# Patient Record
Sex: Male | Born: 1950
Health system: Southern US, Community
[De-identification: ages and names within clinical notes are randomized; demographics above are authoritative.]

## PROBLEM LIST (undated history)

## (undated) DIAGNOSIS — K089 Disorder of teeth and supporting structures, unspecified: Principal | ICD-10-CM

## (undated) DIAGNOSIS — H269 Unspecified cataract: Secondary | ICD-10-CM

## (undated) DIAGNOSIS — C8590 Non-Hodgkin lymphoma, unspecified, unspecified site: Secondary | ICD-10-CM

## (undated) DIAGNOSIS — C801 Malignant (primary) neoplasm, unspecified: Secondary | ICD-10-CM

## (undated) DIAGNOSIS — F32A Depression, unspecified: Secondary | ICD-10-CM

## (undated) DIAGNOSIS — M1990 Unspecified osteoarthritis, unspecified site: Secondary | ICD-10-CM

## (undated) DIAGNOSIS — R5383 Other fatigue: Secondary | ICD-10-CM

## (undated) DIAGNOSIS — E039 Hypothyroidism, unspecified: Principal | ICD-10-CM

## (undated) DIAGNOSIS — H9190 Unspecified hearing loss, unspecified ear: Secondary | ICD-10-CM

## (undated) DIAGNOSIS — F329 Major depressive disorder, single episode, unspecified: Secondary | ICD-10-CM

## (undated) DIAGNOSIS — Z923 Personal history of irradiation: Secondary | ICD-10-CM

## (undated) HISTORY — DX: Personal history of irradiation: Z92.3

## (undated) HISTORY — DX: Unspecified osteoarthritis, unspecified site: M19.90

## (undated) HISTORY — DX: Malignant (primary) neoplasm, unspecified: C80.1

## (undated) HISTORY — DX: Major depressive disorder, single episode, unspecified: F32.9

## (undated) HISTORY — DX: Unspecified cataract: H26.9

## (undated) HISTORY — DX: Depression, unspecified: F32.A

## (undated) HISTORY — DX: Hypothyroidism, unspecified: E03.9

## (undated) HISTORY — PX: TONSILLECTOMY: SUR1361

## (undated) HISTORY — DX: Disorder of teeth and supporting structures, unspecified: K08.9

## (undated) HISTORY — DX: Other fatigue: R53.83

---

## 2013-09-10 ENCOUNTER — Ambulatory Visit (INDEPENDENT_AMBULATORY_CARE_PROVIDER_SITE_OTHER): Payer: BC Managed Care – PPO | Admitting: Emergency Medicine

## 2013-09-10 ENCOUNTER — Encounter (HOSPITAL_COMMUNITY): Payer: Self-pay

## 2013-09-10 ENCOUNTER — Inpatient Hospital Stay (HOSPITAL_COMMUNITY)
Admission: AD | Admit: 2013-09-10 | Discharge: 2013-09-11 | DRG: 607 | Disposition: A | Discharge status: Home / Self Care | Payer: BC Managed Care – PPO | Source: Ambulatory Visit | Attending: Family Medicine | Admitting: Family Medicine

## 2013-09-10 ENCOUNTER — Inpatient Hospital Stay (HOSPITAL_COMMUNITY): Payer: BC Managed Care – PPO

## 2013-09-10 ENCOUNTER — Ambulatory Visit (INDEPENDENT_AMBULATORY_CARE_PROVIDER_SITE_OTHER): Payer: BC Managed Care – PPO

## 2013-09-10 VITALS — BP 158/92 | HR 86 | Temp 98.3°F | Resp 18 | Ht 64.25 in | Wt 200.4 lb

## 2013-09-10 DIAGNOSIS — E876 Hypokalemia: Secondary | ICD-10-CM | POA: Diagnosis present

## 2013-09-10 DIAGNOSIS — R221 Localized swelling, mass and lump, neck: Secondary | ICD-10-CM

## 2013-09-10 DIAGNOSIS — R1310 Dysphagia, unspecified: Secondary | ICD-10-CM | POA: Diagnosis present

## 2013-09-10 DIAGNOSIS — R220 Localized swelling, mass and lump, head: Principal | ICD-10-CM | POA: Diagnosis present

## 2013-09-10 DIAGNOSIS — I10 Essential (primary) hypertension: Secondary | ICD-10-CM | POA: Diagnosis present

## 2013-09-10 DIAGNOSIS — J358 Other chronic diseases of tonsils and adenoids: Secondary | ICD-10-CM | POA: Diagnosis present

## 2013-09-10 DIAGNOSIS — C770 Secondary and unspecified malignant neoplasm of lymph nodes of head, face and neck: Secondary | ICD-10-CM | POA: Diagnosis present

## 2013-09-10 LAB — COMPREHENSIVE METABOLIC PANEL
ALK PHOS: 102 U/L (ref 39–117)
ALT: 43 U/L (ref 0–53)
AST: 32 U/L (ref 0–37)
Albumin: 3.4 g/dL — ABNORMAL LOW (ref 3.5–5.2)
Anion gap: 16 — ABNORMAL HIGH (ref 5–15)
BUN: 18 mg/dL (ref 6–23)
CALCIUM: 8.9 mg/dL (ref 8.4–10.5)
CO2: 23 meq/L (ref 19–32)
Chloride: 98 mEq/L (ref 96–112)
Creatinine, Ser: 1.01 mg/dL (ref 0.50–1.35)
GFR, EST AFRICAN AMERICAN: 89 mL/min — AB (ref >90)
GFR, EST NON AFRICAN AMERICAN: 77 mL/min — AB (ref >90)
GLUCOSE: 113 mg/dL — AB (ref 70–99)
Potassium: 3.5 mEq/L — ABNORMAL LOW (ref 3.7–5.3)
Sodium: 137 mEq/L (ref 137–147)
Total Bilirubin: 0.6 mg/dL (ref 0.3–1.2)
Total Protein: 8.4 g/dL — ABNORMAL HIGH (ref 6.0–8.3)

## 2013-09-10 LAB — CBC WITH DIFFERENTIAL/PLATELET
BASOS PCT: 1 % (ref 0–1)
Basophils Absolute: 0.1 10*3/uL (ref 0.0–0.1)
Eosinophils Absolute: 0.2 10*3/uL (ref 0.0–0.7)
Eosinophils Relative: 1 % (ref 0–5)
HCT: 39.5 % (ref 39.0–52.0)
Hemoglobin: 14 g/dL (ref 13.0–17.0)
Lymphocytes Relative: 22 % (ref 12–46)
Lymphs Abs: 2.4 10*3/uL (ref 0.7–4.0)
MCH: 30.1 pg (ref 26.0–34.0)
MCHC: 35.4 g/dL (ref 30.0–36.0)
MCV: 84.9 fL (ref 78.0–100.0)
Monocytes Absolute: 1 10*3/uL (ref 0.1–1.0)
Monocytes Relative: 9 % (ref 3–12)
NEUTROS PCT: 67 % (ref 43–77)
Neutro Abs: 7.2 10*3/uL (ref 1.7–7.7)
Platelets: 260 10*3/uL (ref 150–400)
RBC: 4.65 MIL/uL (ref 4.22–5.81)
RDW: 13.3 % (ref 11.5–15.5)
WBC: 10.8 10*3/uL — ABNORMAL HIGH (ref 4.0–10.5)

## 2013-09-10 LAB — POCT CBC
GRANULOCYTE PERCENT: 66.6 % (ref 37–80)
HCT, POC: 46.7 % (ref 43.5–53.7)
HEMOGLOBIN: 15.5 g/dL (ref 14.1–18.1)
Lymph, poc: 3 (ref 0.6–3.4)
MCH: 29.5 pg (ref 27–31.2)
MCHC: 33.1 g/dL (ref 31.8–35.4)
MCV: 89.2 fL (ref 80–97)
MID (cbc): 1 — AB (ref 0–0.9)
MPV: 8.1 fL (ref 0–99.8)
PLATELET COUNT, POC: 266 10*3/uL (ref 142–424)
POC Granulocyte: 8 — AB (ref 2–6.9)
POC LYMPH PERCENT: 25.2 %L (ref 10–50)
POC MID %: 8.2 % (ref 0–12)
RBC: 5.24 M/uL (ref 4.69–6.13)
RDW, POC: 13.5 %
WBC: 12 10*3/uL — AB (ref 4.6–10.2)

## 2013-09-10 MED ORDER — ONDANSETRON HCL 4 MG/2ML IJ SOLN: 4.0000 mg | Freq: Four times a day (QID) | INTRAMUSCULAR | Status: DC | PRN

## 2013-09-10 MED ORDER — SODIUM CHLORIDE 0.9 % IJ SOLN
3.0000 mL | Freq: Two times a day (BID) | INTRAMUSCULAR | Status: DC
  Administered 2013-09-10 – 2013-09-11 (×2): 3 mL via INTRAVENOUS

## 2013-09-10 MED ORDER — HYDROCODONE-ACETAMINOPHEN 5-325 MG PO TABS
1.0000 | ORAL_TABLET | ORAL | Status: DC | PRN
  Administered 2013-09-10 – 2013-09-11 (×4): 2 via ORAL
  Filled 2013-09-10 (×4): qty 2

## 2013-09-10 MED ORDER — ONDANSETRON HCL 4 MG PO TABS
4.0000 mg | ORAL_TABLET | Freq: Four times a day (QID) | ORAL | Status: DC | PRN
  Administered 2013-09-11: 4 mg via ORAL
  Filled 2013-09-10: qty 1

## 2013-09-10 MED ORDER — IOHEXOL 300 MG/ML  SOLN
75.0000 mL | Freq: Once | INTRAMUSCULAR | Status: AC | PRN
  Administered 2013-09-10: 75 mL via INTRAVENOUS

## 2013-09-10 MED ORDER — SODIUM CHLORIDE 0.9 % IJ SOLN: 3.0000 mL | INTRAMUSCULAR | Status: DC | PRN

## 2013-09-10 MED ORDER — HEPARIN SODIUM (PORCINE) 5000 UNIT/ML IJ SOLN
5000.0000 [IU] | Freq: Three times a day (TID) | INTRAMUSCULAR | Status: DC
Start: 2013-09-10 — End: 2013-09-11
  Administered 2013-09-10 – 2013-09-11 (×2): 5000 [IU] via SUBCUTANEOUS
  Filled 2013-09-10 (×5): qty 1

## 2013-09-10 MED ORDER — SODIUM CHLORIDE 0.9 % IV SOLN: 250.0000 mL | INTRAVENOUS | Status: DC | PRN

## 2013-09-10 NOTE — Progress Notes (Addendum)
Subjective:    Patient ID: Norman [name], male    DOB: 1950/09/01, 63 y.o.   MRN: [id number]  This chart was scribed for Arlyss Queen, MD by Erling Conte, Medical Scribe. This patient was seen in Room 4 and the patient's care was started at 2:04 PM.  Chief Complaint  Patient presents with  . Neck Pain    X 1 month     HPI HPI Comments: Aidon [name] is a 63 y.o. male who presents [facility] the [facility] complaining of constant, moderate, neck pain for 1 month. He went to the hospital for the same problem about 2-3 weeks ago and was hospitalized for an infection. He is having associated right sided, gradually worsening, swelling of his neck.. He has had no desire to eat or drink anything. The pt states that the swelling is starting to cause pain in his right ear and affect the vision in his right eye. Pt states that the pain is exacerbated by turning his neck and bending down to pick objects up. He denies seeing any specialist since being discharged. Pt was told when he was discharged that he should be better with the medication but he may have to see a specialist, however, they did not recommend anyone. He has taken all the antibiotics he was prescribed. He denies any fever, nausea, SOB, cough, trouble swallowing, chest pain, emesis. 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   Review of Systems  Constitutional: Positive for appetite change. Negative for fever and chills.  HENT: Positive for ear pain (right). Negative for trouble swallowing.   Eyes: Positive for visual disturbance (right eye).  Respiratory: Negative for cough and shortness of breath.   Cardiovascular: Negative for chest pain.  Gastrointestinal: Negative for nausea, vomiting and diarrhea.  Musculoskeletal: Positive for neck pain (and right sided swelling).       Objective:   Physical Exam CONSTITUTIONAL: Well developed/well nourished HEAD: Normocephalic/atraumatic EYES: EOMI/PERRL ENMT: Mucous  membranes moist NECK: supple no meningeal signs. Grapefruit sized mass on right side of neck.  SPINE:entire spine nontender CV: S1/S2 noted, no murmurs/rubs/gallops noted LUNGS: Lungs are clear to auscultation bilaterally, no apparent distress ABDOMEN: soft, nontender, no rebound or guarding GU:no cva tenderness NEURO: Pt is awake/alert, moves all extremitiesx4 EXTREMITIES: pulses normal, full ROM SKIN: warm, color normal PSYCH: no abnormalities of mood noted Results for orders placed in visit on 09/10/13  POCT CBC      Result Value Ref Range   WBC 12.0 (*) 4.6 - 10.2 K/uL   Lymph, poc 3.0  0.6 - 3.4   POC LYMPH PERCENT 25.2  10 - 50 %L   MID (cbc) 1.0 (*) 0 - 0.9   POC MID % 8.2  0 - 12 %M   POC Granulocyte 8.0 (*) 2 - 6.9   Granulocyte percent 66.6  37 - 80 %G   RBC 5.24  4.69 - 6.13 M/uL   Hemoglobin 15.5  14.1 - 18.1 g/dL   HCT, POC 46.7  43.5 - 53.7 %   MCV 89.2  80 - 97 fL   MCH, POC 29.5  27 - 31.2 pg   MCHC 33.1  31.8 - 35.4 g/dL   RDW, POC 13.5     Platelet Count, POC 266  142 - 424 K/uL   MPV 8.1  0 - 99.8 fL  [facility] reading (PRIMARY) by  Dr.Oma Alpert no hilar adenopathy seen.     Assessment & Plan:  Patient presents with a  one-month history of gradually enlarging mass in the right side of his neck. Apparently he was hospitalized in [place] for approximately 4 days. Patient presents today with an enlarging mass right side of his neck associated with difficulty swallowing and eating. He does have tonsillar enlargement on that side with a rockhard grapefruit size mass. It is unclear whether this is a neoplastic process or infectious process. Patient needs emergent CT with definitive treatment dependent on these  findings . His exam is very suspicious for a neoplastic process of note there also is tonsillar enlargement on the right side. Scrofula is also in the differential if this is not a tumor.

## 2013-09-10 NOTE — Progress Notes (Signed)
Patient arrived to floor, paged admitting doctor for orders.

## 2013-09-10 NOTE — Addendum Note (Signed)
Addended by: Arlyss Queen A on: 09/10/2013 04:26 PM   Modules accepted: Level of Service

## 2013-09-10 NOTE — Patient Instructions (Signed)
He needs to go to the [facility] Emergency room to register and be admitted.

## 2013-09-10 NOTE — H&P (Signed)
[facility] Admission History and Physical Service Pager: [phone]  Patient name: Norman [name] Medical record number: [id number] Date of birth: February 16, 1950 Age: 63 y.o. Gender: male  Primary Care Provider: No primary provider on file. Consultants: ENT Code Status: Full (confirmed on admission)  Chief Complaint: Right neck mass.   Assessment and Plan: Witten Certain is a 63 y.o. male presenting with worsening firm mass in right side of neck and lower jaw, causing difficulties with swallowing, without significant airway obstruction. Concerning for malignancy. No significant past medical history (no regular medical follow-up, denies tobacco or alcohol use).  # Right Tonsilar mass, extending into R-neck, suggestive of metastatic tonsillar cancer - Presented with worsening Right neck / facial mass >1 month, presenting symptoms with local pain and decreased swallowing, denies any respiratory compromise. No significant associated symptoms, afebrile, no constitutional symptoms or signs of infection. Recently treated at hospital in [facility], reportedly concern for infection, received antibiotics, imaging, but no conclusive biopsy or diagnosis. Patient currently living in [place], without PCP, direct admitted from Urgent Care for emergent CT imaging, observation to ensure no respiratory compromise, and work-up towards definitive diagnosis. - Admitted to FPTS, inpatient status, med-surg floor - Currently VS with elevated BP, otherwise stable. Exam consistent with large firm mass extending from anterolateral neck into Right jaw/facial region, evidence of tonsillar involvement in oropharynx without obstructed airway. No erythema or evidence of infection. 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 Not currently on any medications - Elevated BP to SBP 160-170 - Consider starting anti-HTN if persistent, vs on discharge  # Hypokalemia - K 3.5 on admission - Re-check in AM, replete PRN  FEN/GI: SLIV Prophylaxis: SQ Heparin  Disposition: Admitted to inpatient status for severe worsening Right neck mass, concern for malignancy, further work-up with CT, proceed with consult ENT / Oncology for further diagnostic work-up while inpatient and arrangement of outpatient follow-up.   History of Present Illness: Norman [name] is a 63 y.o. male presenting with mass in right side of neck.  Started 2 months ago in lower part of right neck pain, with small lump. Initially went to hospital in [place] several weeks ago with sore throat, as told that it might be an infection at this time. Stated imaging and tests were done, and decision to treat with antibiotics and pain medicine. Recommended at that time to see specialist, but has not followed up. Currently with worsening enlargement of mass and increased pain. Completed antibiotics without relief, pain medicine not working. Increasing difficulty with swallowing for past 2-3 days. No difficulties breathing. States that pain is worse with movement, laying flat, palpation, and swallowing, otherwise more of a discomfort vs pain.  Admits to decreased appetite, mild nausea, some intermittent blurred vision. Denies any significant fevers/chills, vomiting, weight loss, night sweats.  Review Of Systems: Per HPI with the following additions:  Otherwise 12 point review of systems was performed and was unremarkable.  Patient Active Problem List   Diagnosis Date  Noted  . Mass of right side of neck 09/10/2013  Past Medical History: Past Medical History  Diagnosis Date  . Arthritis   . Cataract   . Depression    Past Surgical History: History reviewed. No pertinent past surgical history. Social History: History  Substance Use Topics  . Smoking status: Never Smoker   . Smokeless tobacco: Never Used  . Alcohol Use: No   Additional social history: - Denies ever using tobacco products (no smoking or smokeless), no drugs or alcohol - Rare second hand smoke exposure.  Please also refer to relevant sections of EMR.  Family History: History reviewed. No pertinent family history.  Mother with cancer (unknown type). Sister with diabetes  Allergies and Medications: Allergies  Allergen Reactions  . Penicillins Other (See Comments)    Unknown   No current facility-administered medications on file prior to encounter.   No current outpatient prescriptions on file prior to encounter.    Objective: BP 179/97  Pulse 84  Temp(Src) 98.9 F (37.2 C) (Oral)  Resp 18  Ht 5\' 4"  (1.626 m)  Wt 199 lb 9.6 oz (90.538 kg)  BMI 34.24 kg/m2  SpO2 97% Exam: General: well-appearing 63 yr Hispanic male, cooperative, conversational, NAD Head: Large firm bulky mass extending from Right anterolateral neck edge of trachea to Right lateral angle of mandible and into cheek, without erythema, +mild tenderness to touch over neck EENT: PERRL, EOMI, patent nares, oropharynx with enlarged Right palatine tonsil bulging into oropharynx without obstruction, no exudate or erythema, uvula midline, mild dry MM Neck: non-tender and supple (other than above mass), no supraclavicular nodes palpated Cardiovascular: RRR, no murmurs Respiratory: CTAB, no wheezing, rhonchi, crackles. Good air movement, normal work of breathing. Abdomen: obese, soft, NTND, no masses, +active BS Extremities: non-tender, +1 L>R pitting pedal edema, intact peripheral pulses +2 b/l Skin: warm,  dry, no rashes Neuro: awake, alert, oriented, grossly non-focal, CN-II-XII intact, bilateral grip and ankle strength 5/5 b/l ext, intact distal sensation to light touch  Labs and Imaging: CBC BMET   Recent Labs Lab 09/10/13 1806  WBC 10.8*  HGB 14.0  HCT 39.5  PLT 260    Recent Labs Lab 09/10/13 1806  NA 137  K 3.5*  CL 98  CO2 23  BUN 18  CREATININE 1.01  GLUCOSE 113*  CALCIUM 8.9     9/5 CXR 2v IMPRESSION:  No active cardiopulmonary disease or evidence of lung mass or gross  hilar lymphadenopathy.  9/5 CT Neck / Maxillofacial w/ contrast 1. Masslike enlargement of the right palatine tonsil with extensive,  bulky, necrotic lymphadenopathy throughout the right neck, including  a 5.8 cm necrotic right-sided nodal mass with extracapsular  extension. 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  2. Severely compressed versus occluded internal jugular vein in the  mid and upper right neck.  Nobie Putnam, DO 09/10/2013, 10:00 PM [place], PGY-2 [place] Intern pager: [phone], text pages welcome

## 2013-09-11 ENCOUNTER — Encounter: Payer: Self-pay | Admitting: Family Medicine

## 2013-09-11 DIAGNOSIS — R220 Localized swelling, mass and lump, head: Principal | ICD-10-CM

## 2013-09-11 DIAGNOSIS — C770 Secondary and unspecified malignant neoplasm of lymph nodes of head, face and neck: Secondary | ICD-10-CM | POA: Diagnosis present

## 2013-09-11 DIAGNOSIS — R221 Localized swelling, mass and lump, neck: Principal | ICD-10-CM

## 2013-09-11 DIAGNOSIS — I10 Essential (primary) hypertension: Secondary | ICD-10-CM | POA: Diagnosis present

## 2013-09-11 LAB — BASIC METABOLIC PANEL
ANION GAP: 10 (ref 5–15)
BUN: 17 mg/dL (ref 6–23)
CALCIUM: 8.4 mg/dL (ref 8.4–10.5)
CHLORIDE: 101 meq/L (ref 96–112)
CO2: 27 meq/L (ref 19–32)
Creatinine, Ser: 1.07 mg/dL (ref 0.50–1.35)
GFR calc Af Amer: 83 mL/min — ABNORMAL LOW (ref >90)
GFR calc non Af Amer: 72 mL/min — ABNORMAL LOW (ref >90)
Glucose, Bld: 103 mg/dL — ABNORMAL HIGH (ref 70–99)
Potassium: 3.6 mEq/L — ABNORMAL LOW (ref 3.7–5.3)
SODIUM: 138 meq/L (ref 137–147)

## 2013-09-11 LAB — CBC
HCT: 40.8 % (ref 39.0–52.0)
Hemoglobin: 14.1 g/dL (ref 13.0–17.0)
MCH: 30.1 pg (ref 26.0–34.0)
MCHC: 34.6 g/dL (ref 30.0–36.0)
MCV: 87.2 fL (ref 78.0–100.0)
PLATELETS: 252 10*3/uL (ref 150–400)
RBC: 4.68 MIL/uL (ref 4.22–5.81)
RDW: 13.4 % (ref 11.5–15.5)
WBC: 9.9 10*3/uL (ref 4.0–10.5)

## 2013-09-11 MED ORDER — HYDROCODONE-ACETAMINOPHEN 7.5-325 MG/15ML PO SOLN: 10.0000 mL | Freq: Four times a day (QID) | ORAL | Status: DC | PRN

## 2013-09-11 MED ORDER — ONDANSETRON 4 MG PO TBDP: 4.0000 mg | ORAL_TABLET | Freq: Three times a day (TID) | ORAL | Status: DC | PRN

## 2013-09-11 NOTE — Discharge Summary (Signed)
[facility] Discharge Summary  Patient name: Norman [name] Medical record number: [id number] Date of birth: May 24, 1950 Age: 63 y.o. Gender: male Date of Admission: 09/10/2013  Date of Discharge: 09/11/13 Admitting Physician: Lupita Dawn, MD  Primary Care Provider: No primary provider on file. 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 Right neck lymph nodes with extranodal extension Elevated BP, presumed HTN  Disposition: Home  Discharge Condition: Stable  Discharge Exam: General: well-appearing 53 yr Hispanic male, cooperative, conversational, NAD  Head: Large firm bulky mass extending from Right anterolateral neck edge of trachea to Right lateral angle of mandible and into cheek, without erythema, +mild tenderness to touch over neck  EENT: PERRL, EOMI, patent nares, oropharynx with enlarged Right palatine tonsil bulging into oropharynx without obstruction, no exudate or erythema, uvula midline, mild dry MM  Neck: non-tender and supple (other than above mass), no supraclavicular nodes palpated  Cardiovascular: RRR, no murmurs  Respiratory: CTAB, no wheezing, rhonchi, crackles. Good air movement, normal work of breathing.  Abdomen: obese, soft, NTND, no masses, +active BS  Extremities: non-tender, +1 L>R pitting pedal edema, intact peripheral pulses +2 b/l  Skin: warm, dry, no rashes  Neuro: awake, alert, oriented, grossly non-focal, CN-II-XII intact, bilateral grip and ankle strength 5/5 b/l ext, intact distal sensation to light touch  Brief Hospital Course:   Norman [name] is a 63 y.o. male who was directly admitted from Urgent Care with worsening firm mass in right side of neck and lower jaw over past 1 month, recently causing difficulties with swallowing, but no significant airway  obstruction. Presentation concerning for malignancy, without significant history of smoking, tobacco use, or alcohol. No significant PMH known (pt previously without regular follow-up). Of note, recently hospitalized about 3 weeks ago in [facility], reported to have evaluated mass at that time and concerned for infection, however patient did not improve on antibiotics, there was no evidence of infection on history or exam. On admission, patient with significantly large and firm Right neck mass, also with significant Right tonsillar involvement without airway obstruction, or significant complications on exam, other than difficulty with solid foods but tolerating liquids and soft foods well. 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 Patient remained stable through hospitalization. Noted to have elevated BP without prior diagnosis HTN. Pain improved with Norco, tolerated soft diet and liquids.  Contacted ENT for recommendations, spoke with Dr. Erik Obey at [facility] ENT, advised that the most appropriate next step towards tissue diagnosis and treatment would be for patient to present to [facility] ENT office for Tonsil biopsy and FNA of mass/nodes. Advised to have patient call office on Tuesday 09/13/13 to be scheduled as soon as possible for biopsy and further evaluation, as long as patient comfortable, swallowing without difficulty, and without respiratory compromise. After tissue biopsy, ENT will coordinate follow-up with Oncology for proceeding with therapy.  Discussed in detail all of our findings, recommendations, and plan from ENT with patient and his daughter, via [place] interpreter. They understood the diagnosis, and agreed upon the proposed plan. Prescribed  Hydrocodone solution and Zofran ODT for symptom relief, advised laying upright in bed, continue soft diet. Strict return precautions given if worsening  swelling, pain, respiratory difficulty, shortness of breath, no longer able to swallow, fevers, headaches. Advised to go directly to ED for further evaluation if worsening. Patient plans to follow-up with [facility] for outpatient medicine.  Issues for Follow Up:  1. 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 Oncology follow-up - ENT to arrange pending biopsy 3. HTN - Suspect new diagnosis HTN given elevated BP, recommend future follow-up and BP re-check once treatment underway  Significant Procedures: none  Significant Labs and Imaging:   Recent Labs Lab 09/10/13 1806 09/11/13 0347  WBC 10.8* 9.9  HGB 14.0 14.1  HCT 39.5 40.8  PLT 260 252    Recent Labs Lab 09/10/13 1806 09/11/13 0347  NA 137 138  K 3.5* 3.6*  CL 98 101  CO2 23 27  GLUCOSE 113* 103*  BUN 18 17  CREATININE 1.01 1.07  CALCIUM 8.9 8.4  ALKPHOS 102  --   AST 32  --   ALT 43  --   ALBUMIN 3.4*  --    9/5 CXR 2v  IMPRESSION:  No active cardiopulmonary disease or evidence of lung mass or gross  hilar lymphadenopathy.  9/5 CT Neck / Maxillofacial w/ contrast  1. Masslike enlargement of the right palatine tonsil with extensive,  bulky, necrotic lymphadenopathy throughout the right neck, including  a 5.8 cm necrotic right-sided nodal mass with extracapsular  extension. 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  2. Severely compressed versus occluded internal jugular vein in the  mid and upper right neck.  Results/Tests Pending at Time of Discharge: none  Discharge Medications:    Medication List         HYDROcodone-acetaminophen 7.5-325 mg/15 ml solution  Commonly known as:  HYCET  Take 10 mLs by mouth every 6 (six) hours as needed for  moderate pain.     ondansetron 4 MG disintegrating tablet  Commonly known as:  ZOFRAN ODT  Take 1 tablet (4 mg total) by mouth every 8 (eight) hours as needed for nausea or vomiting.        Discharge Instructions: Please refer to Patient Instructions section of EMR for full details.  Patient was counseled important signs and symptoms that should prompt return to medical care, changes in medications, dietary instructions, activity restrictions, and follow up appointments.   Follow-Up Appointments: Follow-up Information   Follow up with [name], MD. Call on 09/13/2013. (Please call [phone]) to schedule appointment with Dr. Erik Obey or any ENT for evaluation of "Right Tonsilar Mass with Lymph Node extension".)    Specialty:  Otolaryngology   Contact information:   [address] [place] [address] [phone]       Follow up with DAUB, STEVE A, MD. Schedule an appointment as soon as possible for a visit in 1 week. (walk in or schedule hospital follow-up)    Specialty:  Family Medicine   Contact information:   [place] [address] [phone]       Nobie Putnam, DO 09/11/2013, 12:18 PM PGY-2, [place]

## 2013-09-11 NOTE — Progress Notes (Signed)
Discharge instructions gone over with patient and daughter. Home medications gone over. Prescriptions given. Note for work also given. Patient is to follow up with Dr. Erik Obey (ENT) and family practice. Patient is to schedule those appointments Tuesday morning. Instructions regarding reasons to call the doctor and activity gone over with the patient and family. Patient is to stay propped up also. Instructions are also written in Spanish and given to patient. My chart discussed. Patient and family verbalized understanding of instructions.

## 2013-09-11 NOTE — Discharge Summary (Signed)
I agree with the discharge summary as documented.   Matthan Sledge MD  

## 2013-09-11 NOTE — H&P (Signed)
FMTS Attending Note  I personally saw and evaluated the patient. The plan of care was discussed with the resident team. I agree with the assessment and plan as documented by the resident.   63 year old male presents with right neck mass, increasing in size for the past 2-3 months, directly admitted from [facility] urgent care, he reports some difficulty with swallowing, no difficulty with respiration, no cough, no chest pain, no associated fevers or chills, please refer to resident note for additional history of present illness  Vitals: Reviewed General: Pleasant Hispanic male, no acute distress HEENT: Normocephalic, pupils equal in size bilaterally, extra ocular movements are intact, nasal septum midline, no rhinorrhea, moist mucous membranes, 4+ tonsil on the right, per mass present over the right anterior neck Cardiac: Regular rate and rhythm, S1 and S2 present, no murmurs, no heaves or thrills Respiratory: Clear to auscultation bilaterally, normal Abdomen: Soft, nontender, bowel sounds present  CT head and neck identified tonsillar mass suspicious for tonsillar malignancy on the right  Assessment and plan: 63 year-old male admitted with right neck mass 1. Right neck mass-CT scan suspicious for tonsillar malignancy, ENT consulted however they recommended outpatient followup if no acute respiratory or GI compromise 2. Agree with the assessment and plan for other medical issues  Dossie Arbour MD

## 2013-09-11 NOTE — Discharge Instructions (Signed)
Usted fue hospitalizado para una evaluacin posterior del cuello Misa Derecho. 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 Como ya comentamos, este es el cncer ms probable, pero no sabemos qu tipo en este punto. El siguiente paso es para el especialista ENT para realizar una "biopsia de tejido" en la oficina de probar y ver qu tipo es. Ellos van a discutir este procedimiento con usted en detalle. A continuacin, se program para el seguimiento con un mdico especialista en cncer (onclogo), puede tener varios onclogos y usted puede ser tratado con quimioterapia (medicamentos), radiacin (rayos X), y / o remocin [place] tambin.  Por favor, llame al mdico de [facility] ENT - Dr. Erik Obey) primero que la maana del [name] (08.09.15) - Nmero de telfono [phone]) - decirles que usted acaba hospitalizado con Norman [name] "tonsilar gran masa derecho con extensin a los ganglios linfticos ", y el equipo del hospital discutieron el caso con el Dr. Erik Obey y l quera que usted sea visto tan pronto como sea posible.   Prescrita hidrocodona (medicamento para Conservation officer, historic buildings) lquido, tomar - 10 ml cada 6 horas segn sea necesario para Conservation officer, historic buildings (o 4 veces al da) - Consulte a su farmacutico para obtener instruye detalladas si es necesario. Zofran prescrita (medicina nuseas) disolver la tableta - lugar en la boca debajo de la lengua y se disolver, no tragar. El medicamento para el dolor puede hacer que las nuseas, por lo que es una buena idea tomar [place].  Recomendamos que usted duerme elevada, con la cabeza hacia arriba para evitar la compresin de la va area. Recomendamos que usted come [employer] (yogur, granos, Actor, compota de [place]), para evitar la asfixia o difciles alimentos, que pueden llegar a atascarse.  Si usted  desarrolla hinchazn empeoramiento significativo, dolor, dificultad para respirar, falta de aliento, otra hinchazn, dificultad o no puede tragar, vaya directamente a la sala de   -----------------------  You were hospitalized for further evaluation of the Right Neck Mass. CT scan showed that it is most likely a cancerous growth from your Right Tonsil, which has spread to Lymph Nodes in your neck causing the bulging and swelling. As we discussed, this is most likely cancer, but we do not know what type at this point. Next step is for the ENT specialist to perform a "tissue biopsy" in the office to sample it and see what type it is. They will discuss this procedure with you in detail. You will then be scheduled to follow-up with a Cancer Doctor Marine scientist), you may have multiple cancer doctors and you may be treated with Chemotherapy (medicine), Radiation (X-rays), and/or Surgical removal as well.  Please call the ENT Doctor [facility] ENT - Dr. Erik Obey) first thing Tuesday morning (09/13/13) - phone number [phone]) - tell them that you were just hospitalized with a "Large Right Tonsilar Mass with extension to lymph nodes", and the hospital team discussed the case with Dr. Erik Obey and he wanted you to be seen as soon as possible.  Prescribed Hydrocodone (pain medicine) liquid, take - 10 mL every 6 hours as needed for pain (or 4 times a day) - Ask your pharmacist for detailed instructs if needed. Prescribed Zofran (nausea medicine) dissolving tablet - place in mouth under tongue and it will dissolve, do not swallow. The pain medicine may make you nauseas, so it is a good idea to  take both.  Recommend that you sleep elevated, with your head up to avoid airway compression. Recommend that you eat soft diet (yogurt, grits, cereal, apple sauce, eggs), to avoid choking or tough foods, which may get stuck.  If you develop significant worsening swelling, pain, difficulty breathing, shortness of breath,  other swelling, difficulty or unable to swallow, please go directly to the Emergency Department.

## 2013-09-13 ENCOUNTER — Other Ambulatory Visit: Payer: Self-pay | Admitting: Otolaryngology

## 2013-09-13 ENCOUNTER — Ambulatory Visit (INDEPENDENT_AMBULATORY_CARE_PROVIDER_SITE_OTHER): Payer: BC Managed Care – PPO | Admitting: Family Medicine

## 2013-09-13 ENCOUNTER — Encounter: Payer: Self-pay | Admitting: Family Medicine

## 2013-09-13 ENCOUNTER — Other Ambulatory Visit (HOSPITAL_COMMUNITY)
Admission: RE | Admit: 2013-09-13 | Discharge: 2013-09-13 | Disposition: A | Discharge status: Home / Self Care | Payer: Medicaid Other | Source: Ambulatory Visit | Attending: Otolaryngology | Admitting: Otolaryngology

## 2013-09-13 VITALS — BP 194/96 | HR 90 | Temp 98.3°F | Resp 20 | Ht 63.0 in | Wt 196.4 lb

## 2013-09-13 DIAGNOSIS — R220 Localized swelling, mass and lump, head: Secondary | ICD-10-CM | POA: Insufficient documentation

## 2013-09-13 DIAGNOSIS — I517 Cardiomegaly: Secondary | ICD-10-CM

## 2013-09-13 DIAGNOSIS — I10 Essential (primary) hypertension: Secondary | ICD-10-CM

## 2013-09-13 DIAGNOSIS — C760 Malignant neoplasm of head, face and neck: Secondary | ICD-10-CM

## 2013-09-13 DIAGNOSIS — Z789 Other specified health status: Secondary | ICD-10-CM

## 2013-09-13 DIAGNOSIS — R221 Localized swelling, mass and lump, neck: Principal | ICD-10-CM

## 2013-09-13 DIAGNOSIS — R609 Edema, unspecified: Secondary | ICD-10-CM

## 2013-09-13 DIAGNOSIS — J029 Acute pharyngitis, unspecified: Secondary | ICD-10-CM

## 2013-09-13 LAB — POCT CBC
Granulocyte percent: 66.7 %G (ref 37–80)
HCT, POC: 44.5 % (ref 43.5–53.7)
HEMOGLOBIN: 14.9 g/dL (ref 14.1–18.1)
LYMPH, POC: 2.3 (ref 0.6–3.4)
MCH, POC: 29.5 pg (ref 27–31.2)
MCHC: 33.5 g/dL (ref 31.8–35.4)
MCV: 87.8 fL (ref 80–97)
MID (cbc): 0.9 (ref 0–0.9)
MPV: 7.2 fL (ref 0–99.8)
POC Granulocyte: 6.3 (ref 2–6.9)
POC LYMPH PERCENT: 23.8 %L (ref 10–50)
POC MID %: 9.5 %M (ref 0–12)
Platelet Count, POC: 326 10*3/uL (ref 142–424)
RBC: 5.07 M/uL (ref 4.69–6.13)
RDW, POC: 13.1 %
WBC: 9.5 10*3/uL (ref 4.6–10.2)

## 2013-09-13 LAB — POCT RAPID STREP A (OFFICE): RAPID STREP A SCREEN: NEGATIVE

## 2013-09-13 MED ORDER — LISINOPRIL-HYDROCHLOROTHIAZIDE 20-25 MG PO TABS: 1.0000 | ORAL_TABLET | Freq: Every day | ORAL | Status: DC

## 2013-09-13 MED ORDER — AZITHROMYCIN 250 MG PO TABS: ORAL_TABLET | ORAL | Status: DC

## 2013-09-13 NOTE — Progress Notes (Signed)
Subjective: 63 year old man who is here after having gone to a specialist this morning. He was in the emergency room over the weekend and diagnosed with a tumor of the right side of his neck. He went to see their nose and throat doctor today, and his blood pressure was sky high at over 200/190. He was told to come over here and get his blood pressure taken care of. His blood pressure was high when the ER over the weekend also, but it was 190/90. He does not have a history of high blood pressure, but has not been to a doctor for a long time. Is Spanish-speaking. He has been growing this tumor in his right side of his neck over the right last month or 2. He was told that is probably malignant. He denies any chest pain or dizziness or shortness of breath. He does complain of swelling in his ankles. No GI or GU complaints.  Objective: Elderly Hispanic male. Large tumor right side of neck which is very hard. His throat is erythematous with some exudate on right tonsil. Strep test and culture were taken. Chest clear. Heart regular without murmurs. He only has a small amount of edema EKG has LVH changes.  Assessment: Malignant hypertension Trace edema Cervical mass consistent with malignant tumor Right tonsillar pharyngitis LVH Spanish-speaking  Plan:  Blood pressure treatment Return one week  Results for orders placed in visit on 09/13/13  POCT RAPID STREP A (OFFICE)      Result Value Ref Range   Rapid Strep A Screen Negative  Negative  POCT CBC      Result Value Ref Range   WBC 9.5  4.6 - 10.2 K/uL   Lymph, poc 2.3  0.6 - 3.4   POC LYMPH PERCENT 23.8  10 - 50 %L   MID (cbc) 0.9  0 - 0.9   POC MID % 9.5  0 - 12 %M   POC Granulocyte 6.3  2 - 6.9   Granulocyte percent 66.7  37 - 80 %G   RBC 5.07  4.69 - 6.13 M/uL   Hemoglobin 14.9  14.1 - 18.1 g/dL   HCT, POC 44.5  43.5 - 53.7 %   MCV 87.8  80 - 97 fL   MCH, POC 29.5  27 - 31.2 pg   MCHC 33.5  31.8 - 35.4 g/dL   RDW, POC 13.1     Platelet Count, POC 326  142 - 424 K/uL   MPV 7.2  0 - 99.8 fL

## 2013-09-13 NOTE — Patient Instructions (Addendum)
Take the amoxicillin one twice daily for infection in throat  Take the blood pressure pill one pill each morning. Take your first pill this evening when you get it.  Return in 1 weeks for recheck, sooner if you're having any symptoms.  Avoid excessive salt  If he developed chest pain go to emergency room

## 2013-09-15 ENCOUNTER — Encounter: Payer: Self-pay | Admitting: Family Medicine

## 2013-09-15 LAB — CULTURE, GROUP A STREP: Organism ID, Bacteria: NORMAL

## 2013-09-20 ENCOUNTER — Other Ambulatory Visit (HOSPITAL_COMMUNITY): Payer: Self-pay | Admitting: Otolaryngology

## 2013-09-20 ENCOUNTER — Other Ambulatory Visit: Payer: Self-pay | Admitting: Otolaryngology

## 2013-09-20 ENCOUNTER — Encounter (HOSPITAL_COMMUNITY): Payer: Self-pay | Admitting: *Deleted

## 2013-09-20 ENCOUNTER — Ambulatory Visit (INDEPENDENT_AMBULATORY_CARE_PROVIDER_SITE_OTHER): Payer: BC Managed Care – PPO | Admitting: Internal Medicine

## 2013-09-20 ENCOUNTER — Telehealth: Payer: Self-pay | Admitting: Hematology and Oncology

## 2013-09-20 VITALS — BP 130/62 | HR 86 | Temp 97.7°F | Ht 63.0 in | Wt 193.0 lb

## 2013-09-20 DIAGNOSIS — R220 Localized swelling, mass and lump, head: Secondary | ICD-10-CM

## 2013-09-20 DIAGNOSIS — D3705 Neoplasm of uncertain behavior of pharynx: Principal | ICD-10-CM

## 2013-09-20 DIAGNOSIS — D3701 Neoplasm of uncertain behavior of lip: Secondary | ICD-10-CM

## 2013-09-20 DIAGNOSIS — I10 Essential (primary) hypertension: Secondary | ICD-10-CM

## 2013-09-20 DIAGNOSIS — R51 Headache: Secondary | ICD-10-CM

## 2013-09-20 DIAGNOSIS — Z5181 Encounter for therapeutic drug level monitoring: Secondary | ICD-10-CM

## 2013-09-20 DIAGNOSIS — R221 Localized swelling, mass and lump, neck: Secondary | ICD-10-CM

## 2013-09-20 DIAGNOSIS — D3709 Neoplasm of uncertain behavior of other specified sites of the oral cavity: Principal | ICD-10-CM

## 2013-09-20 DIAGNOSIS — R519 Headache, unspecified: Secondary | ICD-10-CM

## 2013-09-20 MED ORDER — DEXAMETHASONE SODIUM PHOSPHATE 10 MG/ML IJ SOLN: 10.0000 mg | Freq: Once | INTRAMUSCULAR | Status: DC

## 2013-09-20 MED ORDER — HYDROCODONE-ACETAMINOPHEN 7.5-325 MG/15ML PO SOLN: 5.0000 mL | Freq: Four times a day (QID) | ORAL | Status: DC | PRN

## 2013-09-20 NOTE — Telephone Encounter (Signed)
S/W PATIENT DTR-IN-LAW AND GAVE NP APPT FOR 09/18 @ 1:30 W/DR. [place].  REFERRING DR. 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

## 2013-09-20 NOTE — H&P (Signed)
Webster, Patrone 63 y.o., male [id number]     Chief Complaint: RIGHT neck mass  HPI: 63 year old Hispanic male  has noticed a lump in his RIGHT neck over the past month.  His throat feels somewhat scratchy.  Swallowing is slightly painful.  Voice is slightly hoarse.  His neck is painful to move.  He has never smoked.  Minimal alcohol consumption.  No prior history of cancer anywhere.  No hemoptysis.  No weight loss.  No other lumps or bumps including axillae or groins.  On specific questioning, he may have some RIGHT referred otalgia.    He was hospitalized this weekend for evaluation.  He was discharged with pain medicine.  He is having some constipation.  He additionally reports some tingling in his fingers, and pedal edema especially when he has been on his feet.  He does not have known hypertension.  He does not have a primary internist.   CT scan in the hospital shows enlargement of the RIGHT tonsil region and multiple bulky RIGHT neck nodes in zones I, II, III, IV, and V.  The internal jugular vein is difficult to visualize.  One-week recheck.  A biopsy of the tongue and the RIGHT neck mass are both suspicious for possible lymphoma, but not diagnostic.  I discussed this with him and his family.  We need to do an excisional versus incisional biopsy of his RIGHT neck mass.   In the short interval since I have seen him last, he is hurting more, the throat and a neck mass have gotten larger, and he is having trouble swallowing.  He feels very run down.  No actual breathing difficulty at this point.   I talk with Dr. Alvy Bimler about getting him under treatment as quickly as possible.  Once we have a positive biopsy we will be able to do such.  PET scan is pending this Saturday.  Dr. Alvy Bimler would like to see him at 1:30 this coming Friday.   I talked about the surgery in detail including risks and complications.  The biggest issue may be airway given his very large tonsil.  Questions were  answered and informed consent was obtained.  I gave him an additional prescription for oxycodone liquid.  PMH: Past Medical History  Diagnosis Date  . Arthritis   . Cataract   . Depression   . Cancer   . Hard of hearing     Surg Hx:No past surgical history on file.  FHx:   Family History  Problem Relation Age of Onset  . Cancer Mother   . Diabetes Sister    SocHx:  reports that he has never smoked. He has never used smokeless tobacco. He reports that he does not drink alcohol or use illicit drugs.  ALLERGIES:  Allergies  Allergen Reactions  . Penicillins Other (See Comments)    Unknown     (Not in a hospital admission)  No results found for this or any previous visit (from the past 48 hour(s)). No results found.  GYJ:EHUDJSHF: Feeling tired (fatigue).  No fever, no night sweats, and no recent weight loss. Head: No headache. Eyes: Eye symptoms. Otolaryngeal: No hearing loss  and no earache.  Tinnitus.  No purulent nasal discharge.  Nasal passage blockage (stuffiness)  and snoring.  No sneezing.  Hoarseness.  No sore throat. Cardiovascular: No chest pain or discomfort  and no palpitations. Pulmonary: No dyspnea.  Cough  and wheezing. Gastrointestinal: Dysphagia.  No heartburn.  Nausea.  No abdominal pain.  Melena.  No diarrhea. Genitourinary: No dysuria. Endocrine: No muscle weakness. Musculoskeletal: Calf muscle cramps, arthralgias, and soft tissue swelling. Neurological: Dizziness.  No fainting.  Tingling.  No numbness. Psychological: No anxiety  and no depression. Skin: No rash.  BP:215/101,  HR: 91 b/min,  Height: 5 ft 4.5 in, Weight: 197 lb , BMI: 33.3 kg/m2,   PHYSICAL EXAM: This is a pleasant stocky middle-aged Hispanic male.  He speaks very little [place].  Mental status seems appropriate.  He hears well in conversational speech.  The head is atraumatic and neck supple.  Cranial nerves intact.  Ear canals are clear with normal drums.  Anterior nose is clear  and moist.  Oral cavity is moist with teeth in good repair.  Oropharynx shows enlargement of the RIGHT tonsil with relatively normal surface configuration.  Soft palate appears normal.  On palpation, the tonsil is bulky and firm.  Neck was extremely bulky firm RIGHT level I, 2, 3, 4, and 5 adenopathy.  No overlying skin changes.   Using the flexible laryngoscope, the nasopharynx is clear with normal eustachian tori.  Oropharynx shows bulky RIGHT tonsil area without specific lesions.  Base of tongue is normal.  Hypopharynx and larynx normal with good airway.  Lungs: Clear to auscultation Heart: Regular rate and rhythm without murmurs Abdomen: Soft, active Extremities: Normal configuration Neurologic: Symmetric, grossly intact.  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    We need to get more tissue for the pathologist to evaluate.  I hope we can do that this week.  Meanwhile, emphasize nutrition.  I am giving him some more pain medicine.  OxyCODONE HCl - 5 MG/5ML Oral Solution;5-10 ml po q4h prn severe pain; [id number]; R0; Rx.  [name] 7/42/5956, 12:51 PM

## 2013-09-20 NOTE — Patient Instructions (Signed)
Linfoma no Hodgkin (Non-Hodgkin's Lymphoma) El linfoma no Hodgkin es un cncer que comienza en el tejido linfoide (parte del sistema de defensas de cuerpo, que lo protege de infecciones, grmenes y Arboriculturist). Los linfocitos (un tipo de glbulo blancos) se encuentran en el tejido linfoide. El linfoma no Hodgkin comienza en los linfocitos. Hay diferentes tipos de linfomas no Hodgkin. Su profesional que lo asiste lo ayudar a Educational psychologist gravedad de Data processing manager. Esto estar basado en el tipo de clulas afectadas y que tan rpido crecen y se expanden. [place] no Hodgkin es un cncer que comienza en los linfocitos. Las causas de esta enfermedad no se conocen. Se cree que hay virus que ocasionan ciertos tipos de linfomas no Hodking. Pero esto es poco frecuente. El riego de Museum/gallery curator este cncer aumenta si:   El sistema inmune (defensas del cuerpo) es dbil, en especial luego de un trasplante de rganos.  Se es una persona mayor de sexo masculino y Patent examiner.  Lleva una dieta alta en grasas.  Esta infectado con ciertos virus. SNTOMAS El linfoma no Hodgkin puede ocurrir a Hotel manager. Puede causar diferentes sntomas como:  Inflamacin de los ganglios linfticos.  Cristy Hilts.  Sudoracin excesiva.  Picazn en la piel.  Cansancio.  Prdida de peso.  Tos, problemas para respirar y Tourist information centre manager.  Debilitamiento y cansancio continuos.  Dolor, inflamacin o sensacin de plenitud del abdomen. DIAGNSTICO El mdico lo examinar para controlar su estado general de salud y Hydrographic surveyor bultos. Se realizarn anlisis de sangre y biopsias (extirpar tejido corporal para examinarlo) del ndulo linftico (ganglios). El mdico podr sugerirle radiografas del pecho, escaneo o puncin lumbar (recoleccin de fluidos de la columna vertebral para su examen).  [place] no Hodgkin puede tratarse de [employer]. Esto depende de los sntomas, el estado del cncer al  momento del diagnstico, y la velocidad a la que se expande. Usted y [employer] lo asiste decidirn en conjunto cul plan ser mejor para usted. El tratamiento pueden incluir:  Terapia de radiacin (utilizar radiacin para destruir las clulas cancergenas).  Quimioterapia (utilizar drogas para destruir las Engineer, civil (consulting)).  Terapia biolgica Careers information officer sistema inmune del cuerpo para tratar Science writer) como la terapia de anticuerpos monoclonales (utilizar anticuerpos que puedan matar o bloquear las clulas cancergenas).  Los tipos ms nuevos de tratamiento son terapias de vacunacin y quimioterapia en altas dosis con trasplante de clulas madre (un tipo de clula), introduciendo este tipo de clulas sanas en el cuerpo. Sin embargo, esto est an en desarrollo. Permanecer libre de sntomas durante 1 o 2 aos despus del [place]. El linfoma no Hodgkin puede ser recurrente. Si reaparece, el mdico le aconsejar acerca del tratamiento ms efectivo. Si est embarazada y tiene linfoma no Hodgkin, necesitar un tratamiento inmediato. El profesional que lo asiste decidir el mejor tratamiento para usted.  SOLICITE ANTENCIN MDICA SI:  Desarrolla nuevos sntomas de linfoma no Hodgkin.  Tiene linfoma no Hodgkin y fiebre continua. Document Released: 04/10/2008 Document Revised: 12/28/2012 [facility] Patient Information 2015 [place]. This information is not intended to replace advice given to you by your health care provider. Make sure you discuss any questions you have with your health care provider.

## 2013-09-20 NOTE — Progress Notes (Signed)
   Subjective:    Patient ID: Norman [name], male    DOB: Aug 27, 1950, 63 y.o.   MRN: [id number]  HPI HTN f/up doing well. Scheduled for surgery on his neck this week. Has some type of lymphoma. Pain and dysphagia is getting worse.   Review of Systems     Objective:   Physical Exam  Vitals reviewed. Constitutional: He is oriented to person, place, and time. He appears well-nourished. He appears distressed.  HENT:  Head:    Tender mass face, lymphoma likely  Eyes: EOM are normal. Pupils are equal, round, and reactive to light. No scleral icterus.  Neck: Normal range of motion. Tracheal deviation present.  Cardiovascular: Normal rate, regular rhythm and normal heart sounds.   Pulmonary/Chest: Effort normal and breath sounds normal.  Lymphadenopathy:    He has cervical adenopathy.  Neurological: He is alert and oriented to person, place, and time. He exhibits normal muscle tone. Coordination normal.  Skin: Skin is intact. Lesion noted.     Psychiatric: He has a normal mood and affect. His behavior is normal. Judgment and thought content normal.          Assessment & Plan:  Interpretor present HTN to goal Mass face

## 2013-09-21 ENCOUNTER — Observation Stay (HOSPITAL_COMMUNITY)
Admission: RE | Admit: 2013-09-21 | Discharge: 2013-09-22 | Disposition: A | Discharge status: Home / Self Care | Payer: BC Managed Care – PPO | Source: Ambulatory Visit | Attending: Otolaryngology | Admitting: Otolaryngology

## 2013-09-21 ENCOUNTER — Encounter (HOSPITAL_COMMUNITY)
Admission: RE | Disposition: A | Discharge status: Home / Self Care | Payer: Self-pay | Source: Ambulatory Visit | Attending: Otolaryngology

## 2013-09-21 ENCOUNTER — Encounter (HOSPITAL_COMMUNITY): Payer: BC Managed Care – PPO | Admitting: Anesthesiology

## 2013-09-21 ENCOUNTER — Ambulatory Visit (HOSPITAL_COMMUNITY): Payer: BC Managed Care – PPO | Admitting: Anesthesiology

## 2013-09-21 ENCOUNTER — Encounter (HOSPITAL_COMMUNITY): Payer: Self-pay | Admitting: *Deleted

## 2013-09-21 DIAGNOSIS — F3289 Other specified depressive episodes: Secondary | ICD-10-CM | POA: Insufficient documentation

## 2013-09-21 DIAGNOSIS — F329 Major depressive disorder, single episode, unspecified: Secondary | ICD-10-CM | POA: Insufficient documentation

## 2013-09-21 DIAGNOSIS — Z880 Allergy status to penicillin: Secondary | ICD-10-CM | POA: Insufficient documentation

## 2013-09-21 DIAGNOSIS — H9190 Unspecified hearing loss, unspecified ear: Secondary | ICD-10-CM | POA: Diagnosis not present

## 2013-09-21 DIAGNOSIS — C8581 Other specified types of non-Hodgkin lymphoma, lymph nodes of head, face, and neck: Principal | ICD-10-CM | POA: Insufficient documentation

## 2013-09-21 DIAGNOSIS — M129 Arthropathy, unspecified: Secondary | ICD-10-CM | POA: Insufficient documentation

## 2013-09-21 DIAGNOSIS — Z8572 Personal history of non-Hodgkin lymphomas: Secondary | ICD-10-CM | POA: Diagnosis present

## 2013-09-21 DIAGNOSIS — R599 Enlarged lymph nodes, unspecified: Secondary | ICD-10-CM | POA: Diagnosis present

## 2013-09-21 HISTORY — PX: MASS BIOPSY: SHX5445

## 2013-09-21 HISTORY — PX: TONSILLECTOMY: SHX5217

## 2013-09-21 HISTORY — DX: Unspecified hearing loss, unspecified ear: H91.90

## 2013-09-21 SURGERY — BIOPSY, MASS, NECK
Anesthesia: General | Site: Throat | Laterality: Right

## 2013-09-21 MED ORDER — ONDANSETRON 4 MG PO TBDP
4.0000 mg | ORAL_TABLET | Freq: Every day | ORAL | Status: DC | PRN
  Filled 2013-09-21: qty 1

## 2013-09-21 MED ORDER — LACTATED RINGERS IV SOLN
INTRAVENOUS | Status: DC | PRN
  Administered 2013-09-21 (×2): via INTRAVENOUS

## 2013-09-21 MED ORDER — FENTANYL CITRATE 0.05 MG/ML IJ SOLN
INTRAMUSCULAR | Status: AC
  Filled 2013-09-21: qty 5

## 2013-09-21 MED ORDER — PROMETHAZINE HCL 25 MG/ML IJ SOLN: 6.2500 mg | INTRAMUSCULAR | Status: DC | PRN

## 2013-09-21 MED ORDER — PHENYLEPHRINE 40 MCG/ML (10ML) SYRINGE FOR IV PUSH (FOR BLOOD PRESSURE SUPPORT)
PREFILLED_SYRINGE | INTRAVENOUS | Status: AC
  Filled 2013-09-21: qty 10

## 2013-09-21 MED ORDER — PHENYLEPHRINE HCL 10 MG/ML IJ SOLN
10.0000 mg | INTRAMUSCULAR | Status: DC | PRN
  Administered 2013-09-21: 25 ug/min via INTRAVENOUS

## 2013-09-21 MED ORDER — BACITRACIN ZINC 500 UNIT/GM EX OINT
TOPICAL_OINTMENT | CUTANEOUS | Status: AC
  Filled 2013-09-21: qty 15

## 2013-09-21 MED ORDER — SUCCINYLCHOLINE CHLORIDE 20 MG/ML IJ SOLN
INTRAMUSCULAR | Status: DC | PRN
  Administered 2013-09-21: 130 mg via INTRAVENOUS

## 2013-09-21 MED ORDER — 0.9 % SODIUM CHLORIDE (POUR BTL) OPTIME
TOPICAL | Status: DC | PRN
  Administered 2013-09-21: 1000 mL

## 2013-09-21 MED ORDER — DEXAMETHASONE SODIUM PHOSPHATE 10 MG/ML IJ SOLN
INTRAMUSCULAR | Status: DC | PRN
  Administered 2013-09-21: 10 mg via INTRAVENOUS

## 2013-09-21 MED ORDER — ONDANSETRON HCL 4 MG/2ML IJ SOLN: 4.0000 mg | INTRAMUSCULAR | Status: DC | PRN

## 2013-09-21 MED ORDER — HYDROCHLOROTHIAZIDE 25 MG PO TABS
25.0000 mg | ORAL_TABLET | Freq: Every day | ORAL | Status: DC
  Administered 2013-09-21: 25 mg via ORAL
  Filled 2013-09-21 (×2): qty 1

## 2013-09-21 MED ORDER — PROPOFOL 10 MG/ML IV BOLUS
INTRAVENOUS | Status: DC | PRN
  Administered 2013-09-21: 50 mg via INTRAVENOUS
  Administered 2013-09-21: 160 mg via INTRAVENOUS
  Administered 2013-09-21: 40 mg via INTRAVENOUS

## 2013-09-21 MED ORDER — CLINDAMYCIN PHOSPHATE 600 MG/50ML IV SOLN
INTRAVENOUS | Status: AC
  Administered 2013-09-21: 600 mg via INTRAVENOUS
  Filled 2013-09-21: qty 50

## 2013-09-21 MED ORDER — SCOPOLAMINE 1 MG/3DAYS TD PT72
1.0000 | MEDICATED_PATCH | TRANSDERMAL | Status: DC
  Administered 2013-09-21: 1.5 mg via TRANSDERMAL
  Filled 2013-09-21: qty 1

## 2013-09-21 MED ORDER — PROPOFOL 10 MG/ML IV BOLUS
INTRAVENOUS | Status: AC
  Filled 2013-09-21: qty 20

## 2013-09-21 MED ORDER — DEXAMETHASONE SODIUM PHOSPHATE 10 MG/ML IJ SOLN
INTRAMUSCULAR | Status: AC
Start: 2013-09-21 — End: 2013-09-21
  Filled 2013-09-21: qty 1

## 2013-09-21 MED ORDER — MIDAZOLAM HCL 2 MG/2ML IJ SOLN
INTRAMUSCULAR | Status: AC
  Filled 2013-09-21: qty 2

## 2013-09-21 MED ORDER — FENTANYL CITRATE 0.05 MG/ML IJ SOLN: 25.0000 ug | INTRAMUSCULAR | Status: DC | PRN

## 2013-09-21 MED ORDER — FENTANYL CITRATE 0.05 MG/ML IJ SOLN
INTRAMUSCULAR | Status: DC | PRN
Start: 2013-09-21 — End: 2013-09-21
  Administered 2013-09-21 (×3): 50 ug via INTRAVENOUS

## 2013-09-21 MED ORDER — SUCCINYLCHOLINE CHLORIDE 20 MG/ML IJ SOLN
INTRAMUSCULAR | Status: AC
  Filled 2013-09-21: qty 1

## 2013-09-21 MED ORDER — HYDROCODONE-ACETAMINOPHEN 7.5-325 MG/15ML PO SOLN
10.0000 mL | ORAL | Status: DC | PRN
  Administered 2013-09-22: 10 mL via ORAL
  Filled 2013-09-21: qty 15

## 2013-09-21 MED ORDER — LISINOPRIL-HYDROCHLOROTHIAZIDE 20-25 MG PO TABS: 1.0000 | ORAL_TABLET | Freq: Every day | ORAL | Status: DC

## 2013-09-21 MED ORDER — CHLORHEXIDINE GLUCONATE 4 % EX LIQD
1.0000 "application " | Freq: Once | CUTANEOUS | Status: DC
  Filled 2013-09-21: qty 15

## 2013-09-21 MED ORDER — MORPHINE SULFATE 2 MG/ML IJ SOLN: 1.0000 mg | INTRAMUSCULAR | Status: DC | PRN

## 2013-09-21 MED ORDER — MEPERIDINE HCL 25 MG/ML IJ SOLN: 6.2500 mg | INTRAMUSCULAR | Status: DC | PRN

## 2013-09-21 MED ORDER — LIDOCAINE HCL (CARDIAC) 20 MG/ML IV SOLN
INTRAVENOUS | Status: DC | PRN
  Administered 2013-09-21: 100 mg via INTRAVENOUS

## 2013-09-21 MED ORDER — ONDANSETRON HCL 4 MG PO TABS: 4.0000 mg | ORAL_TABLET | ORAL | Status: DC | PRN

## 2013-09-21 MED ORDER — HYDROCODONE-ACETAMINOPHEN 7.5-325 MG/15ML PO SOLN: 5.0000 mL | Freq: Four times a day (QID) | ORAL | Status: DC | PRN

## 2013-09-21 MED ORDER — ROCURONIUM BROMIDE 100 MG/10ML IV SOLN
INTRAVENOUS | Status: DC | PRN
  Administered 2013-09-21: 35 mg via INTRAVENOUS
  Administered 2013-09-21: 5 mg via INTRAVENOUS
  Administered 2013-09-21: 10 mg via INTRAVENOUS

## 2013-09-21 MED ORDER — ONDANSETRON HCL 4 MG/2ML IJ SOLN
INTRAMUSCULAR | Status: DC | PRN
  Administered 2013-09-21: 4 mg via INTRAVENOUS

## 2013-09-21 MED ORDER — PHENYLEPHRINE HCL 10 MG/ML IJ SOLN
INTRAMUSCULAR | Status: DC | PRN
  Administered 2013-09-21: 120 ug via INTRAVENOUS
  Administered 2013-09-21 (×2): 80 ug via INTRAVENOUS
  Administered 2013-09-21: 40 ug via INTRAVENOUS
  Administered 2013-09-21: 80 ug via INTRAVENOUS

## 2013-09-21 MED ORDER — CHLORHEXIDINE GLUCONATE 4 % EX LIQD: 1.0000 "application " | Freq: Once | CUTANEOUS | Status: DC

## 2013-09-21 MED ORDER — LISINOPRIL 20 MG PO TABS
20.0000 mg | ORAL_TABLET | Freq: Every day | ORAL | Status: DC
  Administered 2013-09-21: 20 mg via ORAL
  Filled 2013-09-21 (×2): qty 1

## 2013-09-21 MED ORDER — ACETAMINOPHEN 10 MG/ML IV SOLN
INTRAVENOUS | Status: AC
  Filled 2013-09-21: qty 100

## 2013-09-21 MED ORDER — DEXTROSE-NACL 5-0.45 % IV SOLN
INTRAVENOUS | Status: DC
  Administered 2013-09-21 – 2013-09-22 (×2): via INTRAVENOUS

## 2013-09-21 MED ORDER — LIDOCAINE-EPINEPHRINE 0.5 %-1:200000 IJ SOLN
INTRAMUSCULAR | Status: AC
  Filled 2013-09-21: qty 1

## 2013-09-21 MED ORDER — ONDANSETRON HCL 4 MG/2ML IJ SOLN
INTRAMUSCULAR | Status: AC
  Filled 2013-09-21: qty 2

## 2013-09-21 MED ORDER — LIDOCAINE-EPINEPHRINE 1 %-1:100000 IJ SOLN
INTRAMUSCULAR | Status: AC
  Filled 2013-09-21: qty 1

## 2013-09-21 MED ORDER — LIDOCAINE-EPINEPHRINE 1 %-1:100000 IJ SOLN
INTRAMUSCULAR | Status: DC | PRN
  Administered 2013-09-21: 15 mL

## 2013-09-21 MED ORDER — METHYLENE BLUE 1 % INJ SOLN
INTRAMUSCULAR | Status: AC
  Filled 2013-09-21: qty 10

## 2013-09-21 MED ORDER — OXYCODONE HCL 5 MG/5ML PO SOLN: 5.0000 mg | ORAL | Status: DC | PRN

## 2013-09-21 MED ORDER — ACETAMINOPHEN 10 MG/ML IV SOLN
INTRAVENOUS | Status: DC | PRN
  Administered 2013-09-21: 1000 mg via INTRAVENOUS

## 2013-09-21 SURGICAL SUPPLY — 47 items
CANISTER SUCTION 2500CC (MISCELLANEOUS) ×4 IMPLANT
CLEANER TIP ELECTROSURG 2X2 (MISCELLANEOUS) ×4 IMPLANT
CONT SPEC 4OZ CLIKSEAL STRL BL (MISCELLANEOUS) ×16 IMPLANT
COVER SURGICAL LIGHT HANDLE (MISCELLANEOUS) ×4 IMPLANT
CRADLE DONUT ADULT HEAD (MISCELLANEOUS) ×4 IMPLANT
DERMABOND ADVANCED (GAUZE/BANDAGES/DRESSINGS) ×2
DERMABOND ADVANCED .7 DNX12 (GAUZE/BANDAGES/DRESSINGS) ×2 IMPLANT
DRAIN SNY 10 ROU (WOUND CARE) ×4 IMPLANT
DRSG EMULSION OIL 3X3 NADH (GAUZE/BANDAGES/DRESSINGS) IMPLANT
ELECT COATED BLADE 2.86 ST (ELECTRODE) ×8 IMPLANT
ELECT REM PT RETURN 9FT ADLT (ELECTROSURGICAL) ×4
ELECTRODE REM PT RTRN 9FT ADLT (ELECTROSURGICAL) ×2 IMPLANT
EVACUATOR SILICONE 100CC (DRAIN) ×4 IMPLANT
GAUZE SPONGE 4X4 12PLY STRL (GAUZE/BANDAGES/DRESSINGS) IMPLANT
GAUZE SPONGE 4X4 16PLY XRAY LF (GAUZE/BANDAGES/DRESSINGS) ×4 IMPLANT
GLOVE BIO SURGEON STRL SZ 6 (GLOVE) ×4 IMPLANT
GLOVE BIO SURGEON STRL SZ7.5 (GLOVE) ×4 IMPLANT
GLOVE BIOGEL M STER SZ 6 (GLOVE) ×4 IMPLANT
GLOVE BIOGEL PI IND STRL 6.5 (GLOVE) ×2 IMPLANT
GLOVE BIOGEL PI IND STRL 7.0 (GLOVE) ×4 IMPLANT
GLOVE BIOGEL PI INDICATOR 6.5 (GLOVE) ×2
GLOVE BIOGEL PI INDICATOR 7.0 (GLOVE) ×4
GLOVE ECLIPSE 8.0 STRL XLNG CF (GLOVE) ×4 IMPLANT
GLOVE SURG SS PI 7.0 STRL IVOR (GLOVE) ×8 IMPLANT
GOWN STRL REUS W/ TWL LRG LVL3 (GOWN DISPOSABLE) ×6 IMPLANT
GOWN STRL REUS W/ TWL XL LVL3 (GOWN DISPOSABLE) ×2 IMPLANT
GOWN STRL REUS W/TWL LRG LVL3 (GOWN DISPOSABLE) ×6
GOWN STRL REUS W/TWL XL LVL3 (GOWN DISPOSABLE) ×2
KIT BASIN OR (CUSTOM PROCEDURE TRAY) ×4 IMPLANT
KIT ROOM TURNOVER OR (KITS) ×4 IMPLANT
NEEDLE HYPO 25GX1X1/2 BEV (NEEDLE) ×4 IMPLANT
NEEDLE SPNL 22GX3.5 QUINCKE BK (NEEDLE) ×4 IMPLANT
NS IRRIG 1000ML POUR BTL (IV SOLUTION) ×4 IMPLANT
PAD ARMBOARD 7.5X6 YLW CONV (MISCELLANEOUS) ×8 IMPLANT
PENCIL BUTTON HOLSTER BLD 10FT (ELECTRODE) ×4 IMPLANT
PENCIL FOOT CONTROL (ELECTRODE) ×4 IMPLANT
STAPLER VISISTAT 35W (STAPLE) ×4 IMPLANT
SUT CHROMIC 3 0 PS 2 (SUTURE) ×8 IMPLANT
SUT CHROMIC 4 0 P 3 18 (SUTURE) ×4 IMPLANT
SUT ETHILON 4 0 PS 2 18 (SUTURE) ×4 IMPLANT
SUT SILK 3 0 (SUTURE) ×2
SUT SILK 3 0 SH CR/8 (SUTURE) ×4 IMPLANT
SUT SILK 3-0 18XBRD TIE 12 (SUTURE) ×2 IMPLANT
SYRINGE CONTROL L 12CC (SYRINGE) ×4 IMPLANT
TOWEL OR 17X24 6PK STRL BLUE (TOWEL DISPOSABLE) ×4 IMPLANT
TOWEL OR 17X26 10 PK STRL BLUE (TOWEL DISPOSABLE) ×4 IMPLANT
TRAY ENT MC OR (CUSTOM PROCEDURE TRAY) ×4 IMPLANT

## 2013-09-21 NOTE — Interval H&P Note (Signed)
History and Physical Interval Note:  09/21/2013 8:47 AM  Norman [name]  has presented today for surgery, with the diagnosis of right neck adenopathy  The various methods of treatment have been discussed with the patient and family. After consideration of risks, benefits and other options for treatment, the patient has consented to  Procedure(s): EXCISIONAL BIOPSY RIGHT NECK NODE (Right) as a surgical intervention .  The patient's history has been re-reviewed, patient re-examined, no change in status, stable for surgery.  I have re-reviewed the patient's chart and labs.  Questions were answered to the patient's satisfaction.     [name]

## 2013-09-21 NOTE — Transfer of Care (Signed)
Immediate Anesthesia Transfer of Care Note  Patient: Norman [name]  Procedure(s) Performed: Procedure(s) with comments: EXCISIONAL BIOPSY RIGHT NECK NODE (Right) - with frozen sections RIGHT TONSILLECTOMY (Right)  Patient Location: PACU  Anesthesia Type:General  Level of Consciousness: awake  Airway & Oxygen Therapy: Patient Spontanous Breathing and Patient connected to face mask oxygen  Post-op Assessment: Report given to PACU RN  Post vital signs: Reviewed and stable  Complications: No apparent anesthesia complications

## 2013-09-21 NOTE — Plan of Care (Signed)
Problem: Phase I Progression Outcomes Goal: Pain controlled with appropriate interventions Outcome: Progressing Complains of discomfort to right neck incision. Ice applied. Goal: Incision/dressings dry and intact Outcome: Progressing Skin closure glue to right neck incision.. Goal: Tubes/drains patent Outcome: Progressing JP patent, draining minimal bloody output. Goal: Voiding-avoid urinary catheter unless indicated Outcome: Completed/Met Date Met:  09/21/13 Using urinal

## 2013-09-21 NOTE — Discharge Instructions (Signed)
Amigdalectoma (Tonsillectomy) La amigdalectoma es una ciruga para extirpar las amgdalas. Las amgdalas son bultos de tejido linftico que se encuentran en la zona posterior de la garganta. Las amgdalas pueden juntar residuos, por lo que pueden infectarse. La amigdalectoma generalmente se realiza cuando los tratamientos no quirrgicos no resuelven el problema de las [place].  INFORME A SU MDICO:  Cualquier alergia que tenga.  Todos los [employer] [place], incluidos vitaminas, hierbas, gotas oftlmicas, cremas y medicamentos de venta libre, especialmente aquellos que contengan aspirina o ibuprofeno.  Problemas previos que usted o los [employer] de su familia hayan tenido con el uso de anestsicos.  Enfermedades de [employer].  Cirugas previas.  Afecciones mdicas que tenga.  Si ha tenido tos o Banker. RIESGOS Y COMPLICACIONES En general, la amigdalectoma es un procedimiento seguro. Sin embargo, Engineer, technical sales, pueden surgir problemas. Estos son algunos posibles problemas:  Hemorragias.  Infeccin.  Formacin de cicatrices.  Cambios en el sentido del gusto.  Cambios en la voz.  Cambios en la forma de tragar. ANTES DEL PROCEDIMIENTO  No tome aspirina, ibuprofeno ni medicamentos que contengan estos agentes durante 2 semanas antes del procedimiento.  No coma ni beba nada despus de la medianoche, la noche antes del procedimiento. PROCEDIMIENTO   Le administrarn un medicamento que lo har dormir (anestesia general).  Le colocarn un dispositivo dentro de la boca que presionar la lengua hacia abajo de modo que las amgdalas que se encuentran en la parte posterior de la garganta puedan extirparse sin cortes en la parte externa del cuello o la garganta.  Las amgdalas se extraen con un dispositivo Orthoptist, que corta las amgdalas y Merchandiser, retail los vasos sanguneos circundantes al mismo tiempo, de modo que no sangrar  despus del procedimiento.  Generalmente no se colocan puntos para cerrar el corte. Se formar una costra blanca (escara) en la zona en la que estaban las [place]. DESPUS DEL PROCEDIMIENTO Despus de la ciruga, lo llevarn a una sala de recuperacin para un control ms exhaustivo. Una vez que despierte, se encuentre estabilizado y pueda ingerir lquidos correctamente, podr volver a su casa.  Document Released: 10/02/2004 Document Revised: 12/28/2012 [facility] Patient Information 2015 [place]. This information is not intended to replace advice given to you by your health care provider. Make sure you discuss any questions you have with your health care provider.  Recheck my office 1 week please.  Call [phone] for an appointment.   OK to shower.  You may trim the glue as it lifts up on your RIGHT neck.  No ointment necessary.  Call for bleeding, signs of infection, excessive pain.  Thanks,  [name] MD

## 2013-09-21 NOTE — Anesthesia Preprocedure Evaluation (Addendum)
Anesthesia Evaluation  Patient identified by MRN, date of birth, ID band Patient awake    Reviewed: Allergy & Precautions, H&P , NPO status , Patient's Chart, lab work & pertinent test results  Airway Mallampati: II TM Distance: >3 FB Neck ROM: Full    Dental  (+) Teeth Intact, Dental Advisory Given   Pulmonary  breath sounds clear to auscultation        Cardiovascular hypertension, Pt. on medications Rhythm:Regular Rate:Normal  EKG OK 09/2013   Neuro/Psych Depression    GI/Hepatic   Endo/Other    Renal/GU      Musculoskeletal   Abdominal (+) + obese,   Peds  Hematology   Anesthesia Other Findings   Reproductive/Obstetrics                          Anesthesia Physical Anesthesia Plan  ASA: III  Anesthesia Plan: General   Post-op Pain Management:    Induction: Intravenous  Airway Management Planned: Oral ETT  Additional Equipment:   Intra-op Plan:   Post-operative Plan: Extubation in OR  Informed Consent: I have reviewed the patients History and Physical, chart, labs and discussed the procedure including the risks, benefits and alternatives for the proposed anesthesia with the patient or authorized representative who has indicated his/her understanding and acceptance.     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  Will have Glide scope available  )        Anesthesia Quick Evaluation

## 2013-09-21 NOTE — H&P (View-Only) (Signed)
Norman [name], [place] 63 y.o., male [id number]     Chief Complaint: RIGHT neck mass  HPI: 63 year old Hispanic male  has noticed a lump in his RIGHT neck over the past month.  His throat feels somewhat scratchy.  Swallowing is slightly painful.  Voice is slightly hoarse.  His neck is painful to move.  He has never smoked.  Minimal alcohol consumption.  No prior history of cancer anywhere.  No hemoptysis.  No weight loss.  No other lumps or bumps including axillae or groins.  On specific questioning, he may have some RIGHT referred otalgia.    He was hospitalized this weekend for evaluation.  He was discharged with pain medicine.  He is having some constipation.  He additionally reports some tingling in his fingers, and pedal edema especially when he has been on his feet.  He does not have known hypertension.  He does not have a primary internist.   CT scan in the hospital shows enlargement of the RIGHT tonsil region and multiple bulky RIGHT neck nodes in zones I, II, III, IV, and V.  The internal jugular vein is difficult to visualize.  One-week recheck.  A biopsy of the tongue and the RIGHT neck mass are both suspicious for possible lymphoma, but not diagnostic.  I discussed this with him and his family.  We need to do an excisional versus incisional biopsy of his RIGHT neck mass.   In the short interval since I have seen him last, he is hurting more, the throat and a neck mass have gotten larger, and he is having trouble swallowing.  He feels very run down.  No actual breathing difficulty at this point.   I talk with Dr. Alvy Bimler about getting him under treatment as quickly as possible.  Once we have a positive biopsy we will be able to do such.  PET scan is pending this Saturday.  Dr. Alvy Bimler would like to see him at 1:30 this coming Friday.   I talked about the surgery in detail including risks and complications.  The biggest issue may be airway given his very large tonsil.  Questions were  answered and informed consent was obtained.  I gave him an additional prescription for oxycodone liquid.  PMH: Past Medical History  Diagnosis Date  . Arthritis   . Cataract   . Depression   . Cancer   . Hard of hearing     Surg Hx:No past surgical history on file.  FHx:   Family History  Problem Relation Age of Onset  . Cancer Mother   . Diabetes Sister    SocHx:  reports that he has never smoked. He has never used smokeless tobacco. He reports that he does not drink alcohol or use illicit drugs.  ALLERGIES:  Allergies  Allergen Reactions  . Penicillins Other (See Comments)    Unknown     (Not in a hospital admission)  No results found for this or any previous visit (from the past 48 hour(s)). No results found.  QIW:LNLGXQJJ: Feeling tired (fatigue).  No fever, no night sweats, and no recent weight loss. Head: No headache. Eyes: Eye symptoms. Otolaryngeal: No hearing loss  and no earache.  Tinnitus.  No purulent nasal discharge.  Nasal passage blockage (stuffiness)  and snoring.  No sneezing.  Hoarseness.  No sore throat. Cardiovascular: No chest pain or discomfort  and no palpitations. Pulmonary: No dyspnea.  Cough  and wheezing. Gastrointestinal: Dysphagia.  No heartburn.  Nausea.  No abdominal pain.  Melena.  No diarrhea. Genitourinary: No dysuria. Endocrine: No muscle weakness. Musculoskeletal: Calf muscle cramps, arthralgias, and soft tissue swelling. Neurological: Dizziness.  No fainting.  Tingling.  No numbness. Psychological: No anxiety  and no depression. Skin: No rash.  BP:215/101,  HR: 91 b/min,  Height: 5 ft 4.5 in, Weight: 197 lb , BMI: 33.3 kg/m2,   PHYSICAL EXAM: This is a pleasant stocky middle-aged Hispanic male.  He speaks very little [place].  Mental status seems appropriate.  He hears well in conversational speech.  The head is atraumatic and neck supple.  Cranial nerves intact.  Ear canals are clear with normal drums.  Anterior nose is clear  and moist.  Oral cavity is moist with teeth in good repair.  Oropharynx shows enlargement of the RIGHT tonsil with relatively normal surface configuration.  Soft palate appears normal.  On palpation, the tonsil is bulky and firm.  Neck was extremely bulky firm RIGHT level I, 2, 3, 4, and 5 adenopathy.  No overlying skin changes.   Using the flexible laryngoscope, the nasopharynx is clear with normal eustachian tori.  Oropharynx shows bulky RIGHT tonsil area without specific lesions.  Base of tongue is normal.  Hypopharynx and larynx normal with good airway.  Lungs: Clear to auscultation Heart: Regular rate and rhythm without murmurs Abdomen: Soft, active Extremities: Normal configuration Neurologic: Symmetric, grossly intact.  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    We need to get more tissue for the pathologist to evaluate.  I hope we can do that this week.  Meanwhile, emphasize nutrition.  I am giving him some more pain medicine.  OxyCODONE HCl - 5 MG/5ML Oral Solution;5-10 ml po q4h prn severe pain; [id number]; R0; Rx.  [name] 04/24/6220, 12:51 PM

## 2013-09-21 NOTE — Progress Notes (Signed)
Report given to lauren reynolds rn as caregiver 

## 2013-09-21 NOTE — Progress Notes (Signed)
09/21/2013 6:42 PM  Panjwani, Gaurav [id number]  Post-Op Check    Temp:  [97.7 F (36.5 C)-98.4 F (36.9 C)] 98.2 F (36.8 C) (09/16 1630) Pulse Rate:  [72-95] 83 (09/16 1721) Resp:  [13-22] 22 (09/16 1721) BP: (116-176)/(51-88) 144/70 mmHg (09/16 1721) SpO2:  [92 %-99 %] 97 % (09/16 1721) Weight:  [87.544 kg (193 lb)-96.1 kg (211 lb 13.8 oz)] 96.1 kg (211 lb 13.8 oz) (09/16 1721),     Intake/Output Summary (Last 24 hours) at 09/21/13 1842 Last data filed at 09/21/13 1800  Gross per 24 hour  Intake   1975 ml  Output    765 ml  Net   1210 ml    No results found for this or any previous visit (from the past 24 hour(s)).  SUBJECTIVE:  Fairly large amount of neck and throat pain  Voiding well. Breathing well.  OBJECTIVE:  Neck flat.  Voice strong and clear.  Pharynx clear with clean fossa RIGHT.  IMPRESSION:  Satisfactory check  PLAN:  Pain control.  Advance diet.  Await final pathology.  Anticipate discharge in AM.  Erik Obey, Ileene Hutchinson

## 2013-09-21 NOTE — Op Note (Signed)
09/21/2013  12:33 PM    Kizziah, Gwendolyn Lima  [id number]   Pre-Op Dx:  Right tonsil and neck lymphadenopathy, possible lymphoma  Post-op Dx: Same  Proc: Incisional and excisional biopsies, multiple right neck nodes. Right tonsillectomy   Surg:  [name] T MD  Anes:  GOT  EBL:  200 mL  Comp:  None  Findings:  Short heavily muscled neck. Dense fibrotic lymphadenopathy in the right neck level II and III.  Spinal accessory nerve identified and protected. High right internal jugular ligated.  Several successive frozen sections showed only fibrosis and necrotic tumor. Finally, a positive biopsy for viable lymph node was obtained in the spinal accessory chain.  Heavily fleshy pharynx with asymmetric hypertrophy of the right tonsil.  Procedure: With the patient in a comfortable supine position, general orotracheal anesthesia was induced without difficulty. At an appropriate level, the patient was placed in a slight reverse Trendelenburg. Head was rotated to the left for access to the right neck. The neck was palpated with the findings as described above. A pre-existing skin wrinkle was identified and infiltrated with 1% Xylocaine with 1 100,000 epinephrine, 4 mL total for intraoperative hemostasis. A sterile preparation and draping of the right neck was accomplished.  The CT scans were reviewed.  A 5 cm incision was sharply executed crossing the anterior edge of the sternomastoid muscle. Subplatysmal flaps were raised superiorly and inferiorly. Dissecting around the anterior edge of the sternomastoid muscle, heavy fibrosis and presumed lymphadenopathy was identified. The fibrosis was controlled sharply and bluntly. A roughly 3 x 3 x 5 cm nodal mass was slowly excised. There was some violation of the capsule with expression of pasty white presumed  necrotic tumor material.  The mass was excised and sent for frozen section interpretation.  The interpretation came back necrosis and fibrosis.  I reviewed the CT scan yet again. The carotid artery system was noted to be deep to the nodal mass. An additional firm fibrotic area consistent with adenopathy immediately posterior to the necrotic mass was dissected and resected. This was sent for frozen section interpretation.  This also returned as fibrosis with nonviable lymphoid tissue.  The CT scans were removed. It was elected to go posterior to the sternocleidomastoid muscle and the spinal accessory chain. The transverse incision was sharply extended and subplatysmal flaps were developed further posteriorly.  Working along the fibers of the sternocleidomastoid muscle, incision was carried through the posterior aspect of the muscle. Spinal accessory nerve was identified and protected. Other cervical roots were sacrificed including the greater auricular nerve.  Once again heavy fibrosis was encountered. This was circumcised in a fibrotic mass with presumed nodal contents was removed and sent in for frozen section interpretation. Bleeding was encountered probably from a nearly occluded jugular vein which was controlled superiorly and inferiorly with silk ligatures and suture ligatures. Hemostasis was observed.  The frozen section came back with viable tissue. No further attention to the neck was required.  The wounds were thoroughly irrigated. No bleeding was noted with Valsalva.  The wounds were thoroughly irrigated. A 10 French round perforated drain was placed into the wound and tunneled back to the secondary wound. This was secured at the anterior neck with a 3-0 Ethilon stitch. The sternocleidomastoid layer was closed with a few interrupted 3-0 chromic sutures. Hemostasis was again observed. The platysma layer was closed with 3-0 chromic suture. The subcutaneous was closed with the same suture. Dermabond was used at the skin edges.    At this  point the neck procedure was completed. The patient was returned to flat position, the table was  rotated 90, and the patient placed in Trendelenburg.  The draping was readjusted. Taking care to protect lips, teeth, and endotracheal tube, the Crowe-Davis mouth gag was introduced, expanded for visualization, and from the [place] stand in the standard fashion. The findings were as described above. A right tonsillectomy was elected for additional histologic material and also to allow a better pharyngeal airway.  1% Xylocaine with 1 100,000 epinephrine, 8 mL were infiltrated in the right peritonsillar plane. Several minutes were allowed for this to take effect.  The right tonsil was grasped and retracted medially. The mucosa overlying the anterior and superior poles was coagulated and then cut down to the capsule of the tonsil. Using the cautery tip as a blunt dissector, lysing fibrous bands, and coagulated crossing vessels is identified, the tonsil was dissected from its muscular fossa from anterior to posterior and from inferior to superior. The tonsil was removed in its entirety as determined by examination tonsil and fossa. A small additional quantity of cautery  Rendered the fossa hemostatic.    At this point the mouthgag was relaxed for several minutes. Upon reexpansion, hemostasis was persisted. At this point the procedure was completed the mouthgag was relaxed and removed. Dental status was intact. The patient was returned to anesthesia, awakened, extubated, and transferred to recovery in stable condition.  Dispo:   PACU to 3300  Plan:  Overnight monitoring given assumed sleep apnea. Analgesia. Await final pathology determination. Anticipate removing the drain in 24 hours and discharging the patient to home and the care of his family.  Tyson Alias MD

## 2013-09-21 NOTE — Progress Notes (Signed)
Pt arrived to unit from PACU accompanied by 2 RNs. Pt oriented to room/unit. VS stable, no s/s of acute distress noted. 

## 2013-09-21 NOTE — Anesthesia Postprocedure Evaluation (Signed)
  Anesthesia Post-op Note  Patient: Norman [name]  Procedure(s) Performed: Procedure(s) with comments: EXCISIONAL BIOPSY RIGHT NECK NODE (Right) - with frozen sections RIGHT TONSILLECTOMY (Right)  Patient Location: PACU  Anesthesia Type:General  Level of Consciousness: awake and alert   Airway and Oxygen Therapy: Patient Spontanous Breathing and Patient connected to nasal cannula oxygen  Post-op Pain: mild  Post-op Assessment: Post-op Vital signs reviewed, Patient's Cardiovascular Status Stable, Respiratory Function Stable, Patent Airway and No signs of Nausea or vomiting  Post-op Vital Signs: Reviewed and stable  Last Vitals:  Filed Vitals:   09/21/13 1300  BP: 124/51  Pulse: 91  Temp:   Resp: 18    Complications: No apparent anesthesia complications

## 2013-09-22 ENCOUNTER — Encounter (HOSPITAL_COMMUNITY): Payer: Self-pay | Admitting: Otolaryngology

## 2013-09-22 DIAGNOSIS — C8581 Other specified types of non-Hodgkin lymphoma, lymph nodes of head, face, and neck: Secondary | ICD-10-CM | POA: Diagnosis not present

## 2013-09-22 NOTE — Progress Notes (Signed)
Pt provided with d/c instructions. PIV removed Family at bedside Belongs packed to be sent home. Volunteer services on the way to wheelchair out.

## 2013-09-22 NOTE — Discharge Summary (Signed)
  09/22/2013 9:25 AM  [name], Norman [id number]  Post-Op Day 1  Discharge Summary    Temp:  [97.7 F (36.5 C)-100.4 F (38 C)] 98.2 F (36.8 C) (09/17 0728) Pulse Rate:  [72-95] 84 (09/17 0728) Resp:  [13-26] 14 (09/17 0728) BP: (116-151)/(51-75) 133/67 mmHg (09/17 0728) SpO2:  [92 %-99 %] 96 % (09/17 0728) Weight:  [96.1 kg (211 lb 13.8 oz)] 96.1 kg (211 lb 13.8 oz) (09/16 1721),     Intake/Output Summary (Last 24 hours) at 09/22/13 0925 Last data filed at 09/22/13 0400  Gross per 24 hour  Intake   2475 ml  Output   2090 ml  Net    385 ml   JP 30 ml total  No results found for this or any previous visit (from the past 24 hour(s)).  SUBJECTIVE:  Pain reasonably controlled.  Void spont.  No breathing difficulty.  No bleeding.  OBJECTIVE:  Color, energy OK.  Neck flat.  Drain removed.  Tonsil fossa clean.  IMPRESSION:  Satisfactory check  PLAN:  Discharge home.  Admit: 16 SEP Discharge: 17 SEP Final Diagnosis:  Uncertain behaving neoplasm,RIGHT tonsil and neck adenopathy Proc:  Excisional biopsy, RIGHT neck nodes, RIGHT tonsillectomy, 16 SEP Comp:  None Cond:  Ambulatory, breathing well, taking fluids. Recheck: 2 weeks my office.  Tomorrow with Dr. Alvy Bimler Rx's:  Oxycodone liquid Instructions written and given  Hosp Course:  Underwent surgery on day of admission.  Overnight observation given possible sleep apnea.  Did well. Discharged to home and care of familyon POD 1.  [name]

## 2013-09-22 NOTE — Progress Notes (Signed)
Utilization Review Completed.Norman [name] T9/17/2015  

## 2013-09-23 ENCOUNTER — Telehealth: Payer: Self-pay | Admitting: *Deleted

## 2013-09-23 ENCOUNTER — Other Ambulatory Visit: Payer: Self-pay | Admitting: Hematology and Oncology

## 2013-09-23 ENCOUNTER — Ambulatory Visit: Payer: BC Managed Care – PPO

## 2013-09-23 ENCOUNTER — Encounter: Payer: Self-pay | Admitting: Hematology and Oncology

## 2013-09-23 ENCOUNTER — Telehealth: Payer: Self-pay | Admitting: Hematology and Oncology

## 2013-09-23 ENCOUNTER — Ambulatory Visit (HOSPITAL_BASED_OUTPATIENT_CLINIC_OR_DEPARTMENT_OTHER): Payer: BC Managed Care – PPO | Admitting: Hematology and Oncology

## 2013-09-23 VITALS — BP 125/76 | HR 109 | Temp 98.4°F | Resp 21 | Ht 61.0 in | Wt 188.9 lb

## 2013-09-23 DIAGNOSIS — C8589 Other specified types of non-Hodgkin lymphoma, extranodal and solid organ sites: Secondary | ICD-10-CM

## 2013-09-23 DIAGNOSIS — J988 Other specified respiratory disorders: Secondary | ICD-10-CM | POA: Insufficient documentation

## 2013-09-23 DIAGNOSIS — Z9189 Other specified personal risk factors, not elsewhere classified: Secondary | ICD-10-CM | POA: Insufficient documentation

## 2013-09-23 DIAGNOSIS — C770 Secondary and unspecified malignant neoplasm of lymph nodes of head, face and neck: Secondary | ICD-10-CM

## 2013-09-23 DIAGNOSIS — R070 Pain in throat: Secondary | ICD-10-CM | POA: Insufficient documentation

## 2013-09-23 DIAGNOSIS — C8590 Non-Hodgkin lymphoma, unspecified, unspecified site: Secondary | ICD-10-CM

## 2013-09-23 MED ORDER — FENTANYL 25 MCG/HR TD PT72: 25.0000 ug | MEDICATED_PATCH | TRANSDERMAL | Status: DC

## 2013-09-23 MED ORDER — ALLOPURINOL 300 MG PO TABS: 300.0000 mg | ORAL_TABLET | Freq: Every day | ORAL | Status: DC

## 2013-09-23 MED ORDER — MORPHINE SULFATE 20 MG/5ML PO SOLN: 10.0000 mg | ORAL | Status: DC | PRN

## 2013-09-23 MED ORDER — ONDANSETRON HCL 8 MG PO TABS: 8.0000 mg | ORAL_TABLET | Freq: Three times a day (TID) | ORAL | Status: DC | PRN

## 2013-09-23 MED ORDER — PROCHLORPERAZINE MALEATE 10 MG PO TABS: 10.0000 mg | ORAL_TABLET | Freq: Four times a day (QID) | ORAL | Status: DC | PRN

## 2013-09-23 MED ORDER — PREDNISONE 20 MG PO TABS: 40.0000 mg/m2 | ORAL_TABLET | Freq: Every day | ORAL | Status: DC

## 2013-09-23 NOTE — Telephone Encounter (Signed)
Per staff message and POF I have scheduled appts. Advised scheduler of appts. JMW  

## 2013-09-23 NOTE — Assessment & Plan Note (Signed)
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 PET CT scan has been ordered. I will proceed to order chemotherapy education class, echocardiogram and bone marrow biopsy next week. Hopefully, I can start him on treatment next week. I plan to start him on R-CHOP chemotherapy along with intrathecal chemotherapy.

## 2013-09-23 NOTE — Telephone Encounter (Signed)
Informed daughter in law of BMBx on Monday 9/21 at [facility].  Instructed to arrive at 9;45 am for lab and then BMBx at 10:15 am.  She verbalized understanding.  Notified Butch Penny in [employer] of BMBx at 10:30 am by Dr. Alvy Bimler.

## 2013-09-23 NOTE — Assessment & Plan Note (Signed)
I will start him on allopurinol. 

## 2013-09-23 NOTE — Assessment & Plan Note (Signed)
The patient is at risk of airway obstruction. I contacted the pathologist who reassured me he has adequate tissue for diagnosis. I will proceed to start him on low-dose prednisone.

## 2013-09-23 NOTE — Progress Notes (Signed)
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 male is here because of newly diagnosed cancer as above. His family members served as Astronomer. His initial presentation was a lump in the right side of his neck around July of this year. He was hospitalized. He was referred to ENT and was prescribed antibiotics but the swelling was worse. 09/09/2013, CT scan show significant oropharyngeal mass and lymphadenopathy. He underwent fine-needle aspirate and biopsy which was inconclusive. His family members noted dysphagia since March of this year. He has lost 5 pounds in one week. He have anorexia. He complained of severe for pain, rating his pain is 10 out of 10 pain. The pain medication that was prescribed to him was not helping much. He denies any constipation. He had mild nausea. On 09/21/2013, he have repeat biopsy of the tonsillar mass and excisional lymph node biopsy of the right the neck. Biopsy is pending. MEDICAL HISTORY:  Past Medical History  Diagnosis Date  . Arthritis   . Cataract   . Depression   . Cancer   . Hard of hearing     SURGICAL HISTORY: Past Surgical History  Procedure Laterality Date  . Mass biopsy Right 09/21/2013    Procedure: EXCISIONAL BIOPSY RIGHT NECK NODE;  Surgeon: [name], MD;  Location: [facility];  Service: ENT;  Laterality: Right;  with frozen sections  . Tonsillectomy Right 09/21/2013    Procedure: RIGHT TONSILLECTOMY;  Surgeon: [name], MD;  Location: [facility];  Service: ENT;  Laterality: Right;    SOCIAL HISTORY: History   Social History  . Marital Status: Married    Spouse Name: N/A    Number of Children: N/A  . Years of Education: N/A   Occupational History  . Not on file.   Social  History Main Topics  . Smoking status: Never Smoker   . Smokeless tobacco: Never Used  . Alcohol Use: No  . Drug Use: No  . Sexual Activity: No   Other Topics Concern  . Not on file   Social History Narrative  . No narrative on file    FAMILY HISTORY: Family History  Problem Relation Age of Onset  . Cancer Mother     Gi cancer  . Diabetes Sister     ALLERGIES:  is allergic to penicillins.  MEDICATIONS:  Current Outpatient Prescriptions  Medication Sig Dispense Refill  . allopurinol (ZYLOPRIM) 300 MG tablet Take 1 tablet (300 mg total) by mouth daily.  30 tablet  3  . fentaNYL (DURAGESIC - DOSED MCG/HR) 25 MCG/HR patch Place 1 patch (25 mcg total) onto the skin every 3 (three) days.  5 patch  0  . HYDROcodone-acetaminophen (HYCET) 7.5-325 mg/15 ml solution Take 5 mLs by mouth every 6 (six) hours as needed for severe pain (or cough).      Marland Kitchen lisinopril-hydrochlorothiazide (PRINZIDE,ZESTORETIC) 20-25 MG per tablet Take 1 tablet by mouth daily.  30 tablet  3  . morphine 20 MG/5ML solution Take 2.5 mLs (10 mg total) by mouth every 2 (two) hours as needed for pain.  500 mL  0  . ondansetron (ZOFRAN) 8 MG tablet Take 1 tablet (8 mg total) by mouth every 8 (eight) hours as needed for nausea.  30 tablet  1  . predniSONE (DELTASONE)  20 MG tablet Take 4 tablets (80 mg total) by mouth daily. Take on days 1-5 of chemotherapy.  60 tablet  6  . prochlorperazine (COMPAZINE) 10 MG tablet Take 1 tablet (10 mg total) by mouth every 6 (six) hours as needed (Nausea or vomiting).  30 tablet  6   No current facility-administered medications for this visit.    REVIEW OF SYSTEMS:   Constitutional: Denies fevers, chills or abnormal night sweats Eyes: Denies blurriness of vision, double vision or watery eyes Respiratory: Denies cough, dyspnea or wheezes Cardiovascular: Denies palpitation, chest discomfort or lower extremity swelling Gastrointestinal:  Denies nausea, heartburn or change in bowel  habits Skin: Denies abnormal skin rashes Neurological:Denies numbness, tingling or new weaknesses Behavioral/Psych: Mood is stable, no new changes  All other systems were reviewed with the patient and are negative.  PHYSICAL EXAMINATION: ECOG PERFORMANCE STATUS: 1 - Symptomatic but completely ambulatory  Filed Vitals:   09/23/13 1313  BP: 125/76  Pulse: 109  Temp: 98.4 F (36.9 C)  Resp: 21   Filed Weights   09/23/13 1313  Weight: 188 lb 14.4 oz (85.684 kg)    GENERAL:alert, no distress and comfortable. He is morbidly obese SKIN: skin color, texture, turgor are normal, no rashes or significant lesions EYES: normal, conjunctiva are pink and non-injected, sclera clear OROPHARYNX: Large oropharyngeal masses noted.  NECK: supple, thyroid normal size, non-tender, without nodularity LYMPH:  Large palpable lymphadenopathy is noted on the right side and a small one on the left. There is no other lymphadenopathy.  LUNGS: clear to auscultation and percussion with normal breathing effort HEART: regular rate & rhythm and no murmurs and no lower extremity edema ABDOMEN:abdomen soft, non-tender and normal bowel sounds Musculoskeletal:no cyanosis of digits and no clubbing  PSYCH: alert & oriented x 3 with fluent speech NEURO: no focal motor/sensory deficits  LABORATORY DATA:  I have reviewed the data as listed Lab Results  Component Value Date   WBC 9.5 09/13/2013   HGB 14.9 09/13/2013   HCT 44.5 09/13/2013   MCV 87.8 09/13/2013   PLT 252 09/11/2013    Recent Labs  09/10/13 1806 09/11/13 0347  NA 137 138  K 3.5* 3.6*  CL 98 101  CO2 23 27  GLUCOSE 113* 103*  BUN 18 17  CREATININE 1.01 1.07  CALCIUM 8.9 8.4  GFRNONAA 77* 72*  GFRAA 89* 83*  PROT 8.4*  --   ALBUMIN 3.4*  --   AST 32  --   ALT 43  --   ALKPHOS 102  --   BILITOT 0.6  --     RADIOGRAPHIC STUDIES: I have personally reviewed the radiological images as listed and agreed with the findings in the report. Dg Chest 2  View  09/10/2013   CLINICAL DATA:  Neck mass.  EXAM: CHEST  2 VIEW  COMPARISON:  None.  FINDINGS: Cardiomediastinal silhouette is within normal limits. The lungs are normally to slightly hyperinflated. No airspace consolidation, edema, pleural effusion, or pneumothorax is seen. Subcentimeter, round densities in both lung bases are compatible with nipple shadows. No acute osseous abnormality is identified.  IMPRESSION: No active cardiopulmonary disease or evidence of lung mass or gross hilar lymphadenopathy.   Electronically Signed   By: Logan Bores   On: 09/10/2013 15:07   Ct Soft Tissue Neck W Contrast  09/10/2013   CLINICAL DATA:  Right neck and facial swelling/ mass. Trouble swallowing. Concern for malignancy.  EXAM: CT NECK WITH CONTRAST  CT MAXILLOFACIAL WITH CONTRAST  TECHNIQUE: Multidetector CT imaging of the neck was performed using the standard protocol following the bolus administration of intravenous contrast. Multidetector CT imaging of the maxillofacial structures was performed with intravenous contrast. Multiplanar CT image reconstructions were also generated.  CONTRAST:  33m OMNIPAQUE IOHEXOL 300 MG/ML  SOLN  COMPARISON:  None.  FINDINGS: The visualized portion of the brain is unremarkable. Orbits are unremarkable. There is minimal left frontal and right maxillary sinus mucosal thickening. Mastoid air cells are clear.  Nasopharynx is unremarkable. There is masslike enlargement of the right palatine tonsil which projects medially into the oropharynx and measures approximately 2.9 x 2.0 cm with inferior extension to the level of epiglottis. There is a rim enhancing low density mass/ collection deep to the right sternocleidomastoid extending from the angle of the mandible inferiorly to the level of the thyroid cartilage and measuring 3.8 x 3.7 x 5.8 cm (transverse by AP by craniocaudal), concerning for large, necrotic lymph node with extracapsular extension. There is soft tissue thickening/  inflammatory change surrounding. Posterior to this are bulky right level II lymph nodes measuring up to 3.7 x 2.4 cm. Multiple enlarged right level III, IV, and V lymph nodes are also present measuring up to 3.1 x 2.0 cm. Necrotic right level V nodal mass measures 3.1 x 2.1 cm (series 3, image 83). No enlarged left cervical lymph nodes are identified.  Right submandibular gland is displaced anteriorly by the necrotic mass/ fluid collection in the right neck. The left submandibular gland and parotid glands are unremarkable. Small thyroid nodules measure up to 1 cm in size. Right internal jugular vein is either severely compressed or occluded in the upper and mid neck but appears patent in the lower neck. Mild lower cervical spondylosis is noted.  IMPRESSION: 1. Masslike enlargement of the right palatine tonsil with extensive, bulky, necrotic lymphadenopathy throughout the right neck, including a 5.8 cm necrotic right-sided nodal mass with extracapsular extension. 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 2. Severely compressed versus occluded internal jugular vein in the mid and upper right neck.  These results were called by telephone at the time of interpretation on 09/10/2013 at 9:22 pm to nurse RGillis Ends who verbally acknowledged these results.   Electronically Signed   By: ALogan Bores  On: 09/10/2013 21:25   Ct Maxillofacial W/cm  09/10/2013   CLINICAL DATA:  Right neck and facial swelling/ mass. Trouble swallowing. Concern for malignancy.  EXAM: CT NECK WITH CONTRAST  CT MAXILLOFACIAL WITH CONTRAST  TECHNIQUE: Multidetector CT imaging of the neck was performed using the standard protocol following the bolus administration of intravenous contrast. Multidetector CT imaging of the maxillofacial structures was performed with intravenous contrast. Multiplanar CT image reconstructions were also generated.  CONTRAST:  764mOMNIPAQUE IOHEXOL 300 MG/ML  SOLN  COMPARISON:   None.  FINDINGS: The visualized portion of the brain is unremarkable. Orbits are unremarkable. There is minimal left frontal and right maxillary sinus mucosal thickening. Mastoid air cells are clear.  Nasopharynx is unremarkable. There is masslike enlargement of the right palatine tonsil which projects medially into the oropharynx and measures approximately 2.9 x 2.0 cm with inferior extension to the level of epiglottis. There is a rim enhancing low density mass/ collection deep to the right sternocleidomastoid extending from the angle of the mandible inferiorly to the level of the thyroid cartilage and measuring 3.8 x 3.7 x 5.8 cm (transverse by AP by craniocaudal), concerning for large, necrotic lymph node with  extracapsular extension. There is soft tissue thickening/ inflammatory change surrounding. Posterior to this are bulky right level II lymph nodes measuring up to 3.7 x 2.4 cm. Multiple enlarged right level III, IV, and V lymph nodes are also present measuring up to 3.1 x 2.0 cm. Necrotic right level V nodal mass measures 3.1 x 2.1 cm (series 3, image 83). No enlarged left cervical lymph nodes are identified.  Right submandibular gland is displaced anteriorly by the necrotic mass/ fluid collection in the right neck. The left submandibular gland and parotid glands are unremarkable. Small thyroid nodules measure up to 1 cm in size. Right internal jugular vein is either severely compressed or occluded in the upper and mid neck but appears patent in the lower neck. Mild lower cervical spondylosis is noted.  IMPRESSION: 1. Masslike enlargement of the right palatine tonsil with extensive, bulky, necrotic lymphadenopathy throughout the right neck, including a 5.8 cm necrotic right-sided nodal mass with extracapsular extension. 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 2. Severely compressed versus occluded internal jugular vein in the mid and upper right neck.  These results  were called by telephone at the time of interpretation on 09/10/2013 at 9:22 pm to nurse Gillis Ends, who verbally acknowledged these results.   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 PET CT scan has been ordered. I will proceed to order chemotherapy education class, echocardiogram and bone marrow biopsy next week. Hopefully, I can start him on treatment next week. I plan to start him on R-CHOP chemotherapy along with intrathecal chemotherapy.  Throat pain I recommend a fentanyl patch along with liquid morphine. I warned them about risk of side effects of constipation  At high risk of tumor lysis syndrome I will start him on allopurinol  Airway obstruction The patient is at risk of airway obstruction. I contacted the pathologist who reassured me he has adequate tissue for diagnosis. I will proceed to start him on low-dose prednisone.     Orders Placed This Encounter  Procedures  . CBC with Differential    Standing Status: Future     Number of Occurrences:      Standing Expiration Date: 10/28/2014  . Comprehensive metabolic panel    Standing Status: Future     Number of Occurrences:      Standing Expiration Date: 10/28/2014  . Lactate dehydrogenase    Standing Status: Future     Number of Occurrences:      Standing Expiration Date: 10/28/2014  . Uric Acid    Standing Status: Future     Number of Occurrences:      Standing Expiration Date: 10/28/2014  . Hepatitis B surface antigen    Standing Status: Future     Number of Occurrences:      Standing Expiration Date: 10/28/2014  . Hepatitis B surface antibody    Standing Status: Future     Number of Occurrences:      Standing Expiration Date: 10/28/2014  . Hepatitis B core antibody, IgM    Standing Status: Future     Number of Occurrences:      Standing Expiration Date:  10/28/2014  . Flow Cytometry    Standing Status: Future     Number of Occurrences:      Standing Expiration Date: 10/28/2014  . 2D Echocardiogram without contrast  Standing Status: Future     Number of Occurrences:      Standing Expiration Date: 09/23/2014    Order Specific Question:  Type of Echo    Answer:  Limited    Order Specific Question:  Reason for Exam    Answer:  Evaluate EF    Order Specific Question:  Where should this test be performed    Answer:  [facility]    All questions were answered. The patient knows to call the clinic with any problems, questions or concerns. I spent 55 minutes counseling the patient face to face. The total time spent in the appointment was 60 minutes and more than 50% was on counseling.     [facility], [place], MD 09/23/2013 5:09 PM

## 2013-09-23 NOTE — Telephone Encounter (Signed)
Daughter in law reports family is trying to get pt's wife here from [place].  Their [place] requests Letter from Dr. Alvy Bimler which includes/addresses the following, in layman's terms;  Medical Condition and diagnosis,  Treatment plan, Length of treatment and frequency of visits, Importance of continued monitoring, Prognosis and future treatments.    Family will pick up letter on Monday if ready.  It can be addressed to "To Whom it May Concern."

## 2013-09-23 NOTE — Telephone Encounter (Signed)
Per staff message I have scheduled appts. Desk RN to call patient

## 2013-09-23 NOTE — Telephone Encounter (Signed)
Pt confirmed labs/ov per 09/18 POF, sent msg to add chemo, gave pt AVS....KJ

## 2013-09-23 NOTE — Progress Notes (Signed)
Checked in new patient with no financial issues prior to seeing the dr. The daughter in law -Judson Roch translated. He has not been out country. He has applied for medicaid--Ms. Marcello Moores is Product/process development scientist. He speaks no [place]. He has appt card

## 2013-09-23 NOTE — Assessment & Plan Note (Signed)
I recommend a fentanyl patch along with liquid morphine. I warned them about risk of side effects of constipation

## 2013-09-24 ENCOUNTER — Ambulatory Visit (HOSPITAL_COMMUNITY): Payer: BC Managed Care – PPO

## 2013-09-24 ENCOUNTER — Ambulatory Visit (HOSPITAL_COMMUNITY)
Admission: RE | Admit: 2013-09-24 | Discharge: 2013-09-24 | Disposition: A | Discharge status: Home / Self Care | Payer: BC Managed Care – PPO | Source: Ambulatory Visit | Attending: Otolaryngology | Admitting: Otolaryngology

## 2013-09-24 ENCOUNTER — Encounter (HOSPITAL_COMMUNITY): Payer: Self-pay

## 2013-09-24 ENCOUNTER — Emergency Department (HOSPITAL_COMMUNITY)
Admission: EM | Admit: 2013-09-24 | Discharge: 2013-09-24 | Disposition: A | Discharge status: Home / Self Care | Payer: BC Managed Care – PPO | Attending: Emergency Medicine | Admitting: Emergency Medicine

## 2013-09-24 ENCOUNTER — Encounter (HOSPITAL_COMMUNITY): Payer: Self-pay | Admitting: Emergency Medicine

## 2013-09-24 DIAGNOSIS — L03221 Cellulitis of neck: Secondary | ICD-10-CM

## 2013-09-24 DIAGNOSIS — H9190 Unspecified hearing loss, unspecified ear: Secondary | ICD-10-CM | POA: Diagnosis not present

## 2013-09-24 DIAGNOSIS — D3705 Neoplasm of uncertain behavior of pharynx: Secondary | ICD-10-CM

## 2013-09-24 DIAGNOSIS — Z880 Allergy status to penicillin: Secondary | ICD-10-CM | POA: Insufficient documentation

## 2013-09-24 DIAGNOSIS — L0211 Cutaneous abscess of neck: Secondary | ICD-10-CM | POA: Diagnosis not present

## 2013-09-24 DIAGNOSIS — T8140XA Infection following a procedure, unspecified, initial encounter: Secondary | ICD-10-CM | POA: Insufficient documentation

## 2013-09-24 DIAGNOSIS — Y836 Removal of other organ (partial) (total) as the cause of abnormal reaction of the patient, or of later complication, without mention of misadventure at the time of the procedure: Secondary | ICD-10-CM | POA: Diagnosis not present

## 2013-09-24 DIAGNOSIS — D3701 Neoplasm of uncertain behavior of lip: Secondary | ICD-10-CM

## 2013-09-24 DIAGNOSIS — Z87898 Personal history of other specified conditions: Secondary | ICD-10-CM | POA: Insufficient documentation

## 2013-09-24 DIAGNOSIS — Z9089 Acquired absence of other organs: Secondary | ICD-10-CM | POA: Diagnosis not present

## 2013-09-24 DIAGNOSIS — D3709 Neoplasm of uncertain behavior of other specified sites of the oral cavity: Secondary | ICD-10-CM

## 2013-09-24 DIAGNOSIS — Z8739 Personal history of other diseases of the musculoskeletal system and connective tissue: Secondary | ICD-10-CM | POA: Insufficient documentation

## 2013-09-24 DIAGNOSIS — Z8659 Personal history of other mental and behavioral disorders: Secondary | ICD-10-CM | POA: Diagnosis not present

## 2013-09-24 DIAGNOSIS — Z859 Personal history of malignant neoplasm, unspecified: Secondary | ICD-10-CM | POA: Diagnosis not present

## 2013-09-24 DIAGNOSIS — Z79899 Other long term (current) drug therapy: Secondary | ICD-10-CM | POA: Insufficient documentation

## 2013-09-24 DIAGNOSIS — C760 Malignant neoplasm of head, face and neck: Secondary | ICD-10-CM | POA: Insufficient documentation

## 2013-09-24 HISTORY — DX: Non-Hodgkin lymphoma, unspecified, unspecified site: C85.90

## 2013-09-24 MED ORDER — MORPHINE SULFATE 2 MG/ML IJ SOLN
2.0000 mg | Freq: Once | INTRAMUSCULAR | Status: AC
  Administered 2013-09-24: 2 mg via INTRAVENOUS
  Filled 2013-09-24: qty 1

## 2013-09-24 MED ORDER — FLUDEOXYGLUCOSE F - 18 (FDG) INJECTION
9.9000 | Freq: Once | INTRAVENOUS | Status: AC | PRN
  Administered 2013-09-24: 9.9 via INTRAVENOUS

## 2013-09-24 MED ORDER — CLINDAMYCIN HCL 300 MG PO CAPS: 300.0000 mg | ORAL_CAPSULE | Freq: Three times a day (TID) | ORAL | Status: DC

## 2013-09-24 MED ORDER — CLINDAMYCIN PHOSPHATE 600 MG/50ML IV SOLN
600.0000 mg | Freq: Once | INTRAVENOUS | Status: AC
  Administered 2013-09-24: 600 mg via INTRAVENOUS
  Filled 2013-09-24 (×2): qty 50

## 2013-09-24 NOTE — Consult Note (Signed)
Reason for Consult:right neck redness and pain Referring Physician: ER  Cian Costanzo is an 63 y.o. male.  HPI: Hx of lympoma and large neck mass. He had a bx of the neck on Wednesday by Dr. Erik Obey. He has had increased pain and erythema of the area for last few days. He is already being treated for lymphoma. No swollowing or breathing problems.  Past Medical History  Diagnosis Date  . Arthritis   . Cataract   . Depression   . Cancer   . Hard of hearing   . Lymphoma     Past Surgical History  Procedure Laterality Date  . Mass biopsy Right 09/21/2013    Procedure: EXCISIONAL BIOPSY RIGHT NECK NODE;  Surgeon: [name], MD;  Location: [facility];  Service: ENT;  Laterality: Right;  with frozen sections  . Tonsillectomy Right 09/21/2013    Procedure: RIGHT TONSILLECTOMY;  Surgeon: [name], MD;  Location: [facility] OR;  Service: ENT;  Laterality: Right;    Family History  Problem Relation Age of Onset  . Cancer Mother     Gi cancer  . Diabetes Sister     Social History:  reports that he has never smoked. He has never used smokeless tobacco. He reports that he does not drink alcohol or use illicit drugs.  Allergies:  Allergies  Allergen Reactions  . Penicillins Other (See Comments)    Unknown    Medications: I have reviewed the patient's current medications.  No results found for this or any previous visit (from the past 48 hour(s)).  No results found.  Review of Systems  HENT: Positive for sore throat.   Eyes: Negative.   Respiratory: Negative.   Cardiovascular: Negative.    Blood pressure 127/71, pulse 97, temperature 98.5 F (36.9 C), temperature source Oral, resp. rate 16, SpO2 98.00%. Physical Exam  Constitutional: He appears well-developed.  HENT:  Has a large mass in the right neck which is not larger since surgery. It has erythema to the skin and some tenderness. Small amount of serosanginous drainage from the posterior aspect of the wound. Oc/op- no  swelling. Opens mouth well.   Eyes: Conjunctivae are normal. Pupils are equal, round, and reactive to light.  Cardiovascular: Normal rate.   Respiratory: Effort normal.  GI: Soft.  Lymphadenopathy:    He has cervical adenopathy.    Assessment/Plan: Right neck infection- with the large mass he probably just has a serous collection and some necrotic material after 3 days from biopsy.  Will open up to allow better drainage and give him some antiobiotics. He was informed of the risks and benefits and options discussed. All questions answered and consent obtained. The wound was opened with a hemastat after prepping with betadine. Some serous bloody fluid expressed. C/S sent. A wick was placed. He tolerated well. Start clindamycin and he already has narcotic patch. Follow up with Dr. Erik Obey on Monday or sooner to ER if worse.   Melissa Montane 09/24/2013, 1:31 PM

## 2013-09-24 NOTE — ED Notes (Signed)
ENT MD @ bedside.

## 2013-09-24 NOTE — ED Provider Notes (Signed)
CSN: [id number]     Arrival date & time 09/24/13  1133 History   First MD Initiated Contact with Patient 09/24/13 1317     Chief Complaint  Patient presents with  . Wound Infection     (Consider location/radiation/quality/duration/timing/severity/associated sxs/prior Treatment) HPI Comments: Patient with R neck lymph node biopsy and tonsillectomy on 09/21/13 2/2 probable B-cell lymphoma -- presents with worsening swelling and redness to R neck at incision site with mild drainage. Patient has significant pain. Family states fever at home. Treated with fentanyl patch. No N/V/D. Sent to ED by ENT for bedside evaluation. No troubles breathing or swallowing. The onset of this condition was acute. The course is gradually worsening. Alleviating factors: none.    The history is provided by the patient, medical records and a relative.    Past Medical History  Diagnosis Date  . Arthritis   . Cataract   . Depression   . Cancer   . Hard of hearing   . Lymphoma    Past Surgical History  Procedure Laterality Date  . Mass biopsy Right 09/21/2013    Procedure: EXCISIONAL BIOPSY RIGHT NECK NODE;  Surgeon: [name], MD;  Location: [place];  Service: ENT;  Laterality: Right;  with frozen sections  . Tonsillectomy Right 09/21/2013    Procedure: RIGHT TONSILLECTOMY;  Surgeon: [name], MD;  Location: [facility] OR;  Service: ENT;  Laterality: Right;   Family History  Problem Relation Age of Onset  . Cancer Mother     Gi cancer  . Diabetes Sister    History  Substance Use Topics  . Smoking status: Never Smoker   . Smokeless tobacco: Never Used  . Alcohol Use: No    Review of Systems  Constitutional: Positive for fever and fatigue.  HENT: Positive for facial swelling and sore throat. Negative for congestion, rhinorrhea and trouble swallowing.   Respiratory: Negative for shortness of breath.   Cardiovascular: Negative for leg swelling.  Skin: Positive for color change and wound.     Allergies  Penicillins  Home Medications   Prior to Admission medications   Medication Sig Start Date End Date Taking? Authorizing Provider  allopurinol (ZYLOPRIM) 300 MG tablet Take 1 tablet (300 mg total) by mouth daily. 09/23/13   [name], MD  fentaNYL (DURAGESIC - DOSED MCG/HR) 25 MCG/HR patch Place 1 patch (25 mcg total) onto the skin every 3 (three) days. 09/23/13   [name], MD  HYDROcodone-acetaminophen (HYCET) 7.5-325 mg/15 ml solution Take 5 mLs by mouth every 6 (six) hours as needed for severe pain (or cough). 09/20/13   Orma Flaming, MD  lisinopril-hydrochlorothiazide (PRINZIDE,ZESTORETIC) 20-25 MG per tablet Take 1 tablet by mouth daily. 09/13/13   Posey Boyer, MD  morphine 20 MG/5ML solution Take 2.5 mLs (10 mg total) by mouth every 2 (two) hours as needed for pain. 09/23/13   [name], MD  ondansetron (ZOFRAN) 8 MG tablet Take 1 tablet (8 mg total) by mouth every 8 (eight) hours as needed for nausea. 09/23/13   [name], MD  predniSONE (DELTASONE) 20 MG tablet Take 4 tablets (80 mg total) by mouth daily. Take on days 1-5 of chemotherapy. 09/23/13   [name], MD  prochlorperazine (COMPAZINE) 10 MG tablet Take 1 tablet (10 mg total) by mouth every 6 (six) hours as needed (Nausea or vomiting). 09/23/13   Ni Gorsuch, MD   BP 127/71  Pulse 97  Temp(Src) 98.5 F (36.9 C) (Oral)  Resp 16  SpO2 98%  Physical Exam  Nursing note and vitals reviewed. Constitutional: He appears well-developed and well-nourished.  HENT:  Head: Normocephalic and atraumatic.  Nose: Nose normal.  R firm neck swelling with overlying erythema. Most of swelling is likely 2/2 pre-existing lymph gland swelling. Associated tenderness and warmth that is new per family. No active drainage. Wound is closed with dermabond.   Eyes: Conjunctivae are normal. Right eye exhibits no discharge. Left eye exhibits no discharge.  Cardiovascular: Normal rate, regular rhythm and normal heart sounds.   No murmur  heard. Pulmonary/Chest: Effort normal and breath sounds normal. No stridor. No respiratory distress. He has no wheezes. He has no rales.  Abdominal: Soft. There is no tenderness. There is no rebound and no guarding.  Neurological: He is alert.  Skin: Skin is dry.  Psychiatric: He has a normal mood and affect.    ED Course  Procedures (including critical care time) Labs Review Labs Reviewed - No data to display  Imaging Review No results found.   EKG Interpretation None      1:33 PM Patient seen and examined. Dr. Janace Hoard has already seen the patient. Plan for bedside drainage and d/c to home.   Vital signs reviewed and are as follows: BP 127/71  Pulse 97  Temp(Src) 98.5 F (36.9 C) (Oral)  Resp 16  SpO2 98%  3:22 PM Discharge instructions given. Abx by Dr. Janace Hoard.   MDM   Final diagnoses:  Cellulitis of neck   Patient with post-op infection, managed by ENT, seen in ED by ENT, I&D by ENT.     Carlisle Cater, PA-C 09/24/13 1523

## 2013-09-24 NOTE — ED Notes (Signed)
[facility] completing I&D on R neck incision, pt tolerated well

## 2013-09-24 NOTE — Discharge Instructions (Signed)
Please read and follow all provided instructions.  Your diagnoses today include:  1. Cellulitis of neck    Tests performed today include:  Vital signs. See below for your results today.   Medications prescribed:   Clindamycin - antibiotic  You have been prescribed an antibiotic medicine: take the entire course of medicine even if you are feeling better. Stopping early can cause the antibiotic not to work.  Take any prescribed medications only as directed.   Home care instructions:  Follow any educational materials contained in this packet.    Follow-up instructions: Follow-up with ENT office as instructed.   Return instructions:  Return to the Emergency Department if you have:  Fever  Worsening symptoms  Worsening pain  Worsening swelling  Redness of the skin that moves away from the affected area, especially if it streaks away from the affected area   Any other emergent concerns  Your vital signs today were: BP 130/80   Pulse 86   Temp(Src) 98.5 F (36.9 C) (Oral)   Resp 23   SpO2 93% If your blood pressure (BP) was elevated above 135/85 this visit, please have this repeated by your doctor within one month. --------------

## 2013-09-24 NOTE — ED Notes (Signed)
Pt had biopsy of R neck for lymphoma done in dr [facility] office on Wednesday. Family states since the procedure the incision site has been increasingly red, painful, oozing drainage, and feels warm to touch

## 2013-09-26 ENCOUNTER — Encounter: Payer: Self-pay | Admitting: Hematology and Oncology

## 2013-09-26 ENCOUNTER — Other Ambulatory Visit (HOSPITAL_COMMUNITY)
Admission: RE | Admit: 2013-09-26 | Discharge: 2013-09-26 | Disposition: A | Discharge status: Home / Self Care | Payer: BC Managed Care – PPO | Source: Ambulatory Visit | Attending: Hematology and Oncology | Admitting: Hematology and Oncology

## 2013-09-26 ENCOUNTER — Telehealth: Payer: Self-pay | Admitting: Hematology and Oncology

## 2013-09-26 ENCOUNTER — Ambulatory Visit (HOSPITAL_BASED_OUTPATIENT_CLINIC_OR_DEPARTMENT_OTHER): Payer: BC Managed Care – PPO | Admitting: Hematology and Oncology

## 2013-09-26 ENCOUNTER — Other Ambulatory Visit (HOSPITAL_BASED_OUTPATIENT_CLINIC_OR_DEPARTMENT_OTHER): Payer: BC Managed Care – PPO

## 2013-09-26 VITALS — BP 132/79 | HR 101 | Temp 98.6°F | Resp 16

## 2013-09-26 DIAGNOSIS — I509 Heart failure, unspecified: Secondary | ICD-10-CM | POA: Diagnosis present

## 2013-09-26 DIAGNOSIS — C8590 Non-Hodgkin lymphoma, unspecified, unspecified site: Secondary | ICD-10-CM

## 2013-09-26 DIAGNOSIS — Z9119 Patient's noncompliance with other medical treatment and regimen: Secondary | ICD-10-CM

## 2013-09-26 DIAGNOSIS — I5032 Chronic diastolic (congestive) heart failure: Secondary | ICD-10-CM | POA: Diagnosis present

## 2013-09-26 DIAGNOSIS — M542 Cervicalgia: Secondary | ICD-10-CM | POA: Diagnosis not present

## 2013-09-26 DIAGNOSIS — IMO0002 Reserved for concepts with insufficient information to code with codable children: Secondary | ICD-10-CM

## 2013-09-26 DIAGNOSIS — Z79899 Other long term (current) drug therapy: Secondary | ICD-10-CM

## 2013-09-26 DIAGNOSIS — R070 Pain in throat: Secondary | ICD-10-CM

## 2013-09-26 DIAGNOSIS — Z91199 Patient's noncompliance with other medical treatment and regimen due to unspecified reason: Secondary | ICD-10-CM

## 2013-09-26 DIAGNOSIS — C770 Secondary and unspecified malignant neoplasm of lymph nodes of head, face and neck: Secondary | ICD-10-CM

## 2013-09-26 DIAGNOSIS — C8589 Other specified types of non-Hodgkin lymphoma, extranodal and solid organ sites: Secondary | ICD-10-CM

## 2013-09-26 DIAGNOSIS — B370 Candidal stomatitis: Secondary | ICD-10-CM | POA: Diagnosis present

## 2013-09-26 DIAGNOSIS — L03221 Cellulitis of neck: Principal | ICD-10-CM

## 2013-09-26 DIAGNOSIS — H9190 Unspecified hearing loss, unspecified ear: Secondary | ICD-10-CM | POA: Diagnosis present

## 2013-09-26 DIAGNOSIS — J988 Other specified respiratory disorders: Secondary | ICD-10-CM

## 2013-09-26 DIAGNOSIS — C8581 Other specified types of non-Hodgkin lymphoma, lymph nodes of head, face, and neck: Secondary | ICD-10-CM | POA: Diagnosis present

## 2013-09-26 DIAGNOSIS — R748 Abnormal levels of other serum enzymes: Secondary | ICD-10-CM | POA: Insufficient documentation

## 2013-09-26 DIAGNOSIS — A029 Salmonella infection, unspecified: Secondary | ICD-10-CM | POA: Diagnosis present

## 2013-09-26 DIAGNOSIS — L0211 Cutaneous abscess of neck: Secondary | ICD-10-CM | POA: Diagnosis not present

## 2013-09-26 LAB — LACTATE DEHYDROGENASE (CC13): LDH: 217 U/L (ref 125–245)

## 2013-09-26 LAB — CBC WITH DIFFERENTIAL/PLATELET
BASO%: 0.4 % (ref 0.0–2.0)
BASOS ABS: 0.1 10*3/uL (ref 0.0–0.1)
EOS%: 0.9 % (ref 0.0–7.0)
Eosinophils Absolute: 0.1 10*3/uL (ref 0.0–0.5)
HCT: 42.8 % (ref 38.4–49.9)
HEMOGLOBIN: 14.6 g/dL (ref 13.0–17.1)
LYMPH%: 18.8 % (ref 14.0–49.0)
MCH: 29.6 pg (ref 27.2–33.4)
MCHC: 34.1 g/dL (ref 32.0–36.0)
MCV: 86.6 fL (ref 79.3–98.0)
MONO#: 1.3 10*3/uL — ABNORMAL HIGH (ref 0.1–0.9)
MONO%: 9.8 % (ref 0.0–14.0)
NEUT#: 9.3 10*3/uL — ABNORMAL HIGH (ref 1.5–6.5)
NEUT%: 70.1 % (ref 39.0–75.0)
Platelets: 398 10*3/uL (ref 140–400)
RBC: 4.94 10*6/uL (ref 4.20–5.82)
RDW: 13.2 % (ref 11.0–14.6)
WBC: 13.2 10*3/uL — ABNORMAL HIGH (ref 4.0–10.3)
lymph#: 2.5 10*3/uL (ref 0.9–3.3)
nRBC: 0 % (ref 0–0)

## 2013-09-26 LAB — COMPREHENSIVE METABOLIC PANEL (CC13)
ALK PHOS: 145 U/L (ref 40–150)
ALT: 205 U/L — ABNORMAL HIGH (ref 0–55)
AST: 103 U/L — ABNORMAL HIGH (ref 5–34)
Albumin: 2.9 g/dL — ABNORMAL LOW (ref 3.5–5.0)
Anion Gap: 13 mEq/L — ABNORMAL HIGH (ref 3–11)
BUN: 36.8 mg/dL — AB (ref 7.0–26.0)
CO2: 26 mEq/L (ref 22–29)
Calcium: 9.9 mg/dL (ref 8.4–10.4)
Chloride: 100 mEq/L (ref 98–109)
Creatinine: 1.4 mg/dL — ABNORMAL HIGH (ref 0.7–1.3)
GLUCOSE: 136 mg/dL (ref 70–140)
Potassium: 3.7 mEq/L (ref 3.5–5.1)
Sodium: 139 mEq/L (ref 136–145)
Total Bilirubin: 0.64 mg/dL (ref 0.20–1.20)
Total Protein: 9 g/dL — ABNORMAL HIGH (ref 6.4–8.3)

## 2013-09-26 LAB — GLUCOSE, CAPILLARY: Glucose-Capillary: 127 mg/dL — ABNORMAL HIGH (ref 70–99)

## 2013-09-26 LAB — TECHNOLOGIST REVIEW

## 2013-09-26 LAB — BONE MARROW EXAM

## 2013-09-26 LAB — HEPATITIS B SURFACE ANTIGEN: Hepatitis B Surface Ag: NEGATIVE

## 2013-09-26 LAB — URIC ACID (CC13): URIC ACID, SERUM: 5.7 mg/dL (ref 2.6–7.4)

## 2013-09-26 LAB — HEPATITIS B CORE ANTIBODY, IGM: HEP B C IGM: NONREACTIVE

## 2013-09-26 LAB — HEPATITIS B SURFACE ANTIBODY,QUALITATIVE: HEP B S AB: NEGATIVE

## 2013-09-26 NOTE — Patient Instructions (Addendum)
Aspiracin de la mdula sea - Biopsia de mdula sea - Cuidados posteriores  (Bone Marrow Aspiration, Bone Marrow Biopsy, Care After) Por favor, lea estas instrucciones y consltelas en las prximas semanas. Estas indicaciones le proporcionan informacin general acerca de cmo deber cuidarse despus de dejar el hospital. El mdico tambin podr darle instrucciones especficas. Aunque el tratamiento se ha planificado de acuerdo con las prcticas mdicas disponibles ms recientes, ocasionalmente pueden ocurrir complicaciones inevitables. Si tiene problemas o surgen preguntas luego de recibir el alta, por favor comunquese con su mdico.  AVERIGE LOS RESULTADOS DE SUS ANLISIS Durante su visita no contar con todos los [employer]. En este caso, tenga otra entrevista con su mdico para conocerlos. No suponga que es normal si no tiene noticias de su mdico o del establecimiento de salud. Es Building services engineer seguimiento de todos los [place].  INSTRUCCIONES PARA EL CUIDADO EN EL HOGAR  Si le han administrado un sedante, podr sentirse somnoliento o mareado. El pensamiento puede no ser tan claro como habitualmente. En las prximas 24 horas:   Slo tome medicamentos de venta libre o los que le prescriba su mdico para Best boy, Health and safety inspector o la [place], segn las indicaciones de su mdico.  No consuma alcohol.  Nofume.  No conduzca vehculos.  No tome decisiones importantes.  No opere maquinarias pesadas.  No tenga a su cuidado nios pequeos.  Mantenga el vendaje seco y limpio. Puede reponer el vendaje despus de 24 horas.  Puede tomar un bao o una ducha despus de 24 horas.  Aplique una compresa de hielo durante 20 minutos cada dos horas mientras se encuentre despierto, si lo necesita para Physiological scientist. SOLICITE ATENCIN MDICA SI:   Presenta enrojecimiento, hinchazn o aumento del dolor en el sitio de la biopsia.  Tiene pus en el sitio de la  biopsia.  Hay un drenaje en el sitio de la biopsia que dura ms de Optician, dispensing.  La temperatura oral le sube a ms de 38,9 C (102 F). SOLICITE ATENCIN MDICA DE INMEDIATO SI:   Aparece una erupcin cutnea.  Tiene dificultad para respirar.  Aparece alguna reaccin o efecto secundario por los medicamentos administrados. Document Released: 04/19/2012 [facility] Patient Information 2015 [place]. This information is not intended to replace advice given to you by your health care provider. Make sure you discuss any questions you have with your health care provider. [facility] Discharge Instructions for Post Bone Marrow Procedure  Today you had a bone marrow biopsy and aspirate of right hip  Please keep the pressure dressing in place for at least 24 hours.  Have someone check your dressing periodically for bleeding.  If needed you can reapply a pressure dressing to the site.  Take pain medication as directed.  IF BLEEDING REOCCURS THAT SHOULD BE REPORTED IMMEDIATELY. Call the [facility] at [phone] if during business hours. Or report to the Emergency Room.   I have been informed and understand all the instructions given to me. I know to contact the clinic, my physician, or go to the Emergency Department if any problems should occur. I do not have any questions at this time, but understand that I may call the clinic during office hours at [phone] should I have any questions or need assistance in obtaining follow up care.    __________________________________________  _____________  __________ Signature of Patient or Authorized Representative  Date                   Time    __________________________________________ Nurse's Signature

## 2013-09-26 NOTE — Assessment & Plan Note (Signed)
I reinforced the importance of taking fentanyl patch and liquid morphine around the clock to control his pain.

## 2013-09-26 NOTE — Assessment & Plan Note (Signed)
I am concerned that this may be related to clindamycin antibiotics. I told him to discontinue this and will recheck in 2 days.

## 2013-09-26 NOTE — Assessment & Plan Note (Addendum)
He will continue prednisone therapy 40 mg daily as instructed.

## 2013-09-26 NOTE — Progress Notes (Signed)
[place] OFFICE PROGRESS NOTE  Patient Care Team: No Pcp Per Patient as PCP - General (General Practice)  SUMMARY OF ONCOLOGIC HISTORY: His initial presentation was a lump in the right side of his neck around July of this year. He was hospitalized. He was referred to ENT and was prescribed antibiotics but the swelling was worse. 09/09/2013, CT scan show significant oropharyngeal mass and lymphadenopathy. He underwent fine-needle aspirate and biopsy which was inconclusive. His family members noted dysphagia since March of this year. He has lost 5 pounds in one week. He have anorexia. He complained of severe for pain, rating his pain is 10 out of 10 pain. The pain medication that was prescribed to him was not helping much. He denies any constipation. He had mild nausea. On 09/21/2013, he have repeat biopsy of the tonsillar mass and excisional lymph node biopsy of the right the neck. Biopsy is pending.  INTERVAL HISTORY: Please see below for problem oriented charting. Over the weekend, he was previously seen in the emergency department with suspected right neck infection. He was put on clindamycin. He felt his pain is not much that it controlled with fentanyl patch. He has not started taking liquid morphine sulfate.  REVIEW OF SYSTEMS:   Constitutional: Denies fevers, chills or abnormal weight loss Eyes: Denies blurriness of vision Ears, nose, mouth, throat, and face: Denies mucositis or sore throat Respiratory: Denies cough, dyspnea or wheezes Cardiovascular: Denies palpitation, chest discomfort or lower extremity swelling Gastrointestinal:  Denies nausea, heartburn or change in bowel habits Skin: Denies abnormal skin rashes Lymphatics: Denies new lymphadenopathy or easy bruising Neurological:Denies numbness, tingling or new weaknesses Behavioral/Psych: Mood is stable, no new changes  All other systems were reviewed with the patient and are negative.  I have reviewed  the past medical history, past surgical history, social history and family history with the patient and they are unchanged from previous note.  ALLERGIES:  is allergic to penicillins.  MEDICATIONS:  Current Outpatient Prescriptions  Medication Sig Dispense Refill  . allopurinol (ZYLOPRIM) 300 MG tablet Take 1 tablet (300 mg total) by mouth daily.  30 tablet  3  . clindamycin (CLEOCIN) 300 MG capsule Take 1 capsule (300 mg total) by mouth 3 (three) times daily.  21 capsule  0  . fentaNYL (DURAGESIC - DOSED MCG/HR) 25 MCG/HR patch Place 1 patch (25 mcg total) onto the skin every 3 (three) days.  5 patch  0  . HYDROcodone-acetaminophen (HYCET) 7.5-325 mg/15 ml solution Take 10 mLs by mouth every 6 (six) hours as needed for severe pain (or cough).       Marland Kitchen lisinopril-hydrochlorothiazide (PRINZIDE,ZESTORETIC) 20-25 MG per tablet Take 1 tablet by mouth daily.  30 tablet  3  . ondansetron (ZOFRAN) 8 MG tablet Take 1 tablet (8 mg total) by mouth every 8 (eight) hours as needed for nausea.  30 tablet  1  . predniSONE (DELTASONE) 20 MG tablet Take 4 tablets (80 mg total) by mouth daily. Take on days 1-5 of chemotherapy.  60 tablet  6  . prochlorperazine (COMPAZINE) 10 MG tablet Take 1 tablet (10 mg total) by mouth every 6 (six) hours as needed (Nausea or vomiting).  30 tablet  6   No current facility-administered medications for this visit.    PHYSICAL EXAMINATION: ECOG PERFORMANCE STATUS: 2 - Symptomatic, <50% confined to bed  Filed Vitals:   09/26/13 1122  BP: 132/79  Pulse: 101  Temp: 98.6 F (37 C)  Resp: 16  There were no vitals filed for this visit.  GENERAL:alert, no distress and comfortable SKIN: skin color, texture, turgor are normal, no rashes or significant lesions. The skin around his neck is bandaged. EYES: normal, Conjunctiva are pink and non-injected, sclera clear Musculoskeletal:no cyanosis of digits and no clubbing  NEURO: alert & oriented x 3 with fluent speech, no focal  motor/sensory deficits  LABORATORY DATA:  I have reviewed the data as listed    Component Value Date/Time   NA 139 09/26/2013 0952   NA 138 09/11/2013 0347   K 3.7 09/26/2013 0952   K 3.6* 09/11/2013 0347   CL 101 09/11/2013 0347   CO2 26 09/26/2013 0952   CO2 27 09/11/2013 0347   GLUCOSE 136 09/26/2013 0952   GLUCOSE 103* 09/11/2013 0347   BUN 36.8* 09/26/2013 0952   BUN 17 09/11/2013 0347   CREATININE 1.4* 09/26/2013 0952   CREATININE 1.07 09/11/2013 0347   CALCIUM 9.9 09/26/2013 0952   CALCIUM 8.4 09/11/2013 0347   PROT 9.0* 09/26/2013 0952   PROT 8.4* 09/10/2013 1806   ALBUMIN 2.9* 09/26/2013 0952   ALBUMIN 3.4* 09/10/2013 1806   AST 103* 09/26/2013 0952   AST 32 09/10/2013 1806   ALT 205* 09/26/2013 0952   ALT 43 09/10/2013 1806   ALKPHOS 145 09/26/2013 0952   ALKPHOS 102 09/10/2013 1806   BILITOT 0.64 09/26/2013 0952   BILITOT 0.6 09/10/2013 1806   GFRNONAA 72* 09/11/2013 0347   GFRAA 83* 09/11/2013 0347    No results found for this basename: SPEP,  UPEP,   kappa and lambda light chains    Lab Results  Component Value Date   WBC 13.2* 09/26/2013   NEUTROABS 9.3* 09/26/2013   HGB 14.6 09/26/2013   HCT 42.8 09/26/2013   MCV 86.6 09/26/2013   PLT 398 09/26/2013      Chemistry      Component Value Date/Time   NA 139 09/26/2013 0952   NA 138 09/11/2013 0347   K 3.7 09/26/2013 0952   K 3.6* 09/11/2013 0347   CL 101 09/11/2013 0347   CO2 26 09/26/2013 0952   CO2 27 09/11/2013 0347   BUN 36.8* 09/26/2013 0952   BUN 17 09/11/2013 0347   CREATININE 1.4* 09/26/2013 0952   CREATININE 1.07 09/11/2013 0347      Component Value Date/Time   CALCIUM 9.9 09/26/2013 0952   CALCIUM 8.4 09/11/2013 0347   ALKPHOS 145 09/26/2013 0952   ALKPHOS 102 09/10/2013 1806   AST 103* 09/26/2013 0952   AST 32 09/10/2013 1806   ALT 205* 09/26/2013 0952   ALT 43 09/10/2013 1806   BILITOT 0.64 09/26/2013 0952   BILITOT 0.6 09/10/2013 1806     ASSESSMENT & PLAN:  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 He start on allopurinol. I would proceed with bone marrow biopsy today.  Airway obstruction He will continue prednisone therapy 40 mg daily as instructed.  Elevated liver enzymes I am concerned that this may be related to clindamycin antibiotics. I told him to discontinue this and will recheck in 2 days.  Throat pain I reinforced the importance of taking fentanyl patch and liquid morphine around the clock to control his pain.  Bone Marrow Biopsy and Aspiration Procedure Note   Informed consent was obtained and potential risks including bleeding, infection and pain were reviewed with the patient. The patient's name, date of birth, identification, consent and allergies were verified prior to the start of  procedure and time out was performed. The right posterior iliac crest was chosen as the site of biopsy.  The skin was prepped with Betadine solution.   8 cc of 1% lidocaine was used to provide local anaesthesia.   10 cc of bone marrow aspirate was obtained followed by 1 inch biopsy.   The procedure was tolerated well and there were no complications.  The patient was stable at the end of the procedure.  Specimens sent for flow cytometry, cytogenetics and additional studies.    All questions were answered. The patient knows to call the clinic with any problems, questions or concerns. No barriers to learning was detected. I spent 30 minutes counseling the patient face to face. The total time spent in the appointment was 40 minutes and more than 50% was on counseling and review of test results     [facility], [place], MD 09/26/2013 8:33 PM

## 2013-09-26 NOTE — Telephone Encounter (Signed)
added pt appts per MD....per MD staff pt aware

## 2013-09-26 NOTE — Telephone Encounter (Signed)
s.w.pt and and advised on added appts...Marland Kitchenok and aware

## 2013-09-26 NOTE — Assessment & Plan Note (Signed)
Preliminary biopsy confirmed diffuse large B-cell lymphoma. He start on allopurinol. I would proceed with bone marrow biopsy today.

## 2013-09-27 ENCOUNTER — Other Ambulatory Visit: Payer: Self-pay | Admitting: Hematology and Oncology

## 2013-09-27 ENCOUNTER — Telehealth: Payer: Self-pay | Admitting: Hematology and Oncology

## 2013-09-27 ENCOUNTER — Inpatient Hospital Stay (HOSPITAL_COMMUNITY)
Admission: EM | Admit: 2013-09-27 | Discharge: 2013-10-03 | DRG: 580 | Disposition: A | Discharge status: Home Health | Payer: BC Managed Care – PPO | Attending: Internal Medicine | Admitting: Internal Medicine

## 2013-09-27 ENCOUNTER — Telehealth (HOSPITAL_BASED_OUTPATIENT_CLINIC_OR_DEPARTMENT_OTHER): Payer: Self-pay | Admitting: Emergency Medicine

## 2013-09-27 ENCOUNTER — Encounter (HOSPITAL_COMMUNITY): Payer: Self-pay | Admitting: Emergency Medicine

## 2013-09-27 ENCOUNTER — Telehealth: Payer: Self-pay | Admitting: *Deleted

## 2013-09-27 ENCOUNTER — Other Ambulatory Visit: Payer: BC Managed Care – PPO

## 2013-09-27 ENCOUNTER — Other Ambulatory Visit (HOSPITAL_COMMUNITY): Payer: Self-pay | Admitting: Dentistry

## 2013-09-27 DIAGNOSIS — B370 Candidal stomatitis: Secondary | ICD-10-CM | POA: Diagnosis present

## 2013-09-27 DIAGNOSIS — L03221 Cellulitis of neck: Secondary | ICD-10-CM | POA: Diagnosis present

## 2013-09-27 DIAGNOSIS — H9190 Unspecified hearing loss, unspecified ear: Secondary | ICD-10-CM

## 2013-09-27 DIAGNOSIS — Z789 Other specified health status: Secondary | ICD-10-CM

## 2013-09-27 DIAGNOSIS — R030 Elevated blood-pressure reading, without diagnosis of hypertension: Secondary | ICD-10-CM

## 2013-09-27 DIAGNOSIS — I509 Heart failure, unspecified: Secondary | ICD-10-CM | POA: Diagnosis present

## 2013-09-27 DIAGNOSIS — J988 Other specified respiratory disorders: Secondary | ICD-10-CM | POA: Diagnosis present

## 2013-09-27 DIAGNOSIS — C8581 Other specified types of non-Hodgkin lymphoma, lymph nodes of head, face, and neck: Secondary | ICD-10-CM | POA: Diagnosis present

## 2013-09-27 DIAGNOSIS — IMO0002 Reserved for concepts with insufficient information to code with codable children: Secondary | ICD-10-CM | POA: Diagnosis not present

## 2013-09-27 DIAGNOSIS — M542 Cervicalgia: Secondary | ICD-10-CM | POA: Diagnosis present

## 2013-09-27 DIAGNOSIS — L089 Local infection of the skin and subcutaneous tissue, unspecified: Secondary | ICD-10-CM | POA: Diagnosis present

## 2013-09-27 DIAGNOSIS — R220 Localized swelling, mass and lump, head: Secondary | ICD-10-CM

## 2013-09-27 DIAGNOSIS — Z91199 Patient's noncompliance with other medical treatment and regimen due to unspecified reason: Secondary | ICD-10-CM | POA: Diagnosis not present

## 2013-09-27 DIAGNOSIS — J358 Other chronic diseases of tonsils and adenoids: Secondary | ICD-10-CM

## 2013-09-27 DIAGNOSIS — F329 Major depressive disorder, single episode, unspecified: Secondary | ICD-10-CM

## 2013-09-27 DIAGNOSIS — C8589 Other specified types of non-Hodgkin lymphoma, extranodal and solid organ sites: Secondary | ICD-10-CM

## 2013-09-27 DIAGNOSIS — L0211 Cutaneous abscess of neck: Secondary | ICD-10-CM | POA: Diagnosis present

## 2013-09-27 DIAGNOSIS — Z79899 Other long term (current) drug therapy: Secondary | ICD-10-CM | POA: Diagnosis not present

## 2013-09-27 DIAGNOSIS — A029 Salmonella infection, unspecified: Secondary | ICD-10-CM | POA: Diagnosis present

## 2013-09-27 DIAGNOSIS — Z9189 Other specified personal risk factors, not elsewhere classified: Secondary | ICD-10-CM

## 2013-09-27 DIAGNOSIS — IMO0001 Reserved for inherently not codable concepts without codable children: Secondary | ICD-10-CM

## 2013-09-27 DIAGNOSIS — C770 Secondary and unspecified malignant neoplasm of lymph nodes of head, face and neck: Secondary | ICD-10-CM

## 2013-09-27 DIAGNOSIS — R748 Abnormal levels of other serum enzymes: Secondary | ICD-10-CM

## 2013-09-27 DIAGNOSIS — F32A Depression, unspecified: Secondary | ICD-10-CM

## 2013-09-27 DIAGNOSIS — R070 Pain in throat: Secondary | ICD-10-CM

## 2013-09-27 DIAGNOSIS — C8590 Non-Hodgkin lymphoma, unspecified, unspecified site: Secondary | ICD-10-CM

## 2013-09-27 DIAGNOSIS — Z9119 Patient's noncompliance with other medical treatment and regimen: Secondary | ICD-10-CM | POA: Diagnosis not present

## 2013-09-27 DIAGNOSIS — I5032 Chronic diastolic (congestive) heart failure: Secondary | ICD-10-CM | POA: Diagnosis present

## 2013-09-27 LAB — COMPREHENSIVE METABOLIC PANEL
ALBUMIN: 2.8 g/dL — AB (ref 3.5–5.2)
ALT: 136 U/L — ABNORMAL HIGH (ref 0–53)
AST: 51 U/L — AB (ref 0–37)
Alkaline Phosphatase: 141 U/L — ABNORMAL HIGH (ref 39–117)
Anion gap: 13 (ref 5–15)
BUN: 35 mg/dL — ABNORMAL HIGH (ref 6–23)
CALCIUM: 8.8 mg/dL (ref 8.4–10.5)
CO2: 26 meq/L (ref 19–32)
Chloride: 96 mEq/L (ref 96–112)
Creatinine, Ser: 1.18 mg/dL (ref 0.50–1.35)
GFR calc Af Amer: 74 mL/min — ABNORMAL LOW (ref >90)
GFR, EST NON AFRICAN AMERICAN: 64 mL/min — AB (ref >90)
Glucose, Bld: 112 mg/dL — ABNORMAL HIGH (ref 70–99)
Potassium: 4.1 mEq/L (ref 3.7–5.3)
Sodium: 135 mEq/L — ABNORMAL LOW (ref 137–147)
TOTAL PROTEIN: 7.9 g/dL (ref 6.0–8.3)
Total Bilirubin: 0.4 mg/dL (ref 0.3–1.2)

## 2013-09-27 LAB — CBC WITH DIFFERENTIAL/PLATELET
BASOS ABS: 0.1 10*3/uL (ref 0.0–0.1)
Basophils Relative: 1 % (ref 0–1)
Eosinophils Absolute: 0.1 10*3/uL (ref 0.0–0.7)
Eosinophils Relative: 1 % (ref 0–5)
HCT: 37.9 % — ABNORMAL LOW (ref 39.0–52.0)
Hemoglobin: 13.1 g/dL (ref 13.0–17.0)
Lymphocytes Relative: 17 % (ref 12–46)
Lymphs Abs: 2 10*3/uL (ref 0.7–4.0)
MCH: 29.5 pg (ref 26.0–34.0)
MCHC: 34.6 g/dL (ref 30.0–36.0)
MCV: 85.4 fL (ref 78.0–100.0)
MONO ABS: 1.2 10*3/uL — AB (ref 0.1–1.0)
Monocytes Relative: 10 % (ref 3–12)
NEUTROS PCT: 71 % (ref 43–77)
Neutro Abs: 8.5 10*3/uL — ABNORMAL HIGH (ref 1.7–7.7)
PLATELETS: 365 10*3/uL (ref 150–400)
RBC: 4.44 MIL/uL (ref 4.22–5.81)
RDW: 12.9 % (ref 11.5–15.5)
WBC: 11.9 10*3/uL — AB (ref 4.0–10.5)

## 2013-09-27 MED ORDER — LEVOFLOXACIN IN D5W 750 MG/150ML IV SOLN
750.0000 mg | INTRAVENOUS | Status: DC
  Administered 2013-09-27 – 2013-09-30 (×4): 750 mg via INTRAVENOUS
  Filled 2013-09-27 (×5): qty 150

## 2013-09-27 MED ORDER — SODIUM CHLORIDE 0.9 % IV SOLN
INTRAVENOUS | Status: AC
  Administered 2013-09-27: 23:00:00 via INTRAVENOUS

## 2013-09-27 MED ORDER — NYSTATIN 100000 UNIT/ML MT SUSP
5.0000 mL | Freq: Four times a day (QID) | OROMUCOSAL | Status: DC
  Administered 2013-09-28 – 2013-10-03 (×20): 500000 [IU] via OROMUCOSAL
  Filled 2013-09-27 (×29): qty 5

## 2013-09-27 MED ORDER — HYDROMORPHONE HCL 1 MG/ML IJ SOLN
1.0000 mg | INTRAMUSCULAR | Status: AC | PRN
  Administered 2013-09-27 – 2013-09-28 (×2): 1 mg via INTRAVENOUS
  Filled 2013-09-27 (×2): qty 1

## 2013-09-27 MED ORDER — ONDANSETRON HCL 4 MG/2ML IJ SOLN
4.0000 mg | Freq: Three times a day (TID) | INTRAMUSCULAR | Status: AC | PRN
  Administered 2013-09-27: 4 mg via INTRAVENOUS
  Filled 2013-09-27: qty 2

## 2013-09-27 NOTE — Telephone Encounter (Signed)
I'm waiting for final biopsy report.

## 2013-09-27 NOTE — Telephone Encounter (Incomplete)
Post ED Visit - Positive Culture Follow-up: Successful Patient Follow-Up  Culture assessed and recommendations reviewed by: []  Wes [place], Pharm.D., BCPS []  Heide Guile, Pharm.D., BCPS []  Alycia Rossetti, Pharm.D., BCPS []  [place], Pharm.D., BCPS, AAHIVP []  Legrand Como, Pharm.D., BCPS, AAHIVP []  Hassie Bruce, Pharm.D. []  Milus Glazier, Pharm.D.  Positive wound culture + Salmonella neck wound  []  Patient discharged without antimicrobial prescription and treatment is now indicated [x]  Organism is resistant to prescribed ED discharge antimicrobial []  Patient with positive blood cultures  Changes discussed with ED provider:    Quincy Carnes PA New antibiotic prescription :left voicemail for pt to return call Contacted patient, attempted to call, left voicemail for callback   Hazle Nordmann 09/27/2013, 4:27 PM

## 2013-09-27 NOTE — ED Notes (Signed)
Pt's daughter going home real quick, will be back. Please call if you need anything.

## 2013-09-27 NOTE — ED Provider Notes (Signed)
CSN: [id number]     Arrival date & time 09/27/13  1838 History   First MD Initiated Contact with Patient 09/27/13 2025     Chief Complaint  Patient presents with  . Abscess  . Wound Infection     (Consider location/radiation/quality/duration/timing/severity/associated sxs/prior Treatment) HPI Comments: Patient is a 63 year old male with recently diagnosed lymphoma, likely B-cell. He recently underwent lymph node biopsy by Dr. Erik Obey. The site appeared to become infected and he had an incision and drainage several days ago. He was taking clindamycin but was then told to stop taking this due to elevation of his renal function and liver enzymes. He presents today with increasing pain, drainage from the wound, discomfort in his mouth that is making it difficult for him to eat or drink. Family is very concerned that he may be becoming dehydrated.  Patient is a 63 y.o. male presenting with abscess. The history is provided by the patient.  Abscess Location:  Head/neck Abscess quality: draining, induration and painful   Red streaking: no   Progression:  Worsening Associated symptoms: fatigue   Associated symptoms: no fever     Past Medical History  Diagnosis Date  . Arthritis   . Cataract   . Depression   . Cancer   . Hard of hearing   . Lymphoma    Past Surgical History  Procedure Laterality Date  . Mass biopsy Right 09/21/2013    Procedure: EXCISIONAL BIOPSY RIGHT NECK NODE;  Surgeon: [name], MD;  Location: [facility];  Service: ENT;  Laterality: Right;  with frozen sections  . Tonsillectomy Right 09/21/2013    Procedure: RIGHT TONSILLECTOMY;  Surgeon: [name], MD;  Location: [facility] OR;  Service: ENT;  Laterality: Right;   Family History  Problem Relation Age of Onset  . Cancer Mother     Gi cancer  . Diabetes Sister    History  Substance Use Topics  . Smoking status: Never Smoker   . Smokeless tobacco: Never Used  . Alcohol Use: No    Review of Systems   Constitutional: Positive for fatigue. Negative for fever.  All other systems reviewed and are negative.     Allergies  Penicillins  Home Medications   Prior to Admission medications   Medication Sig Start Date End Date Taking? Authorizing Provider  allopurinol (ZYLOPRIM) 300 MG tablet Take 1 tablet (300 mg total) by mouth daily. 09/23/13  Yes [name], MD  fentaNYL (DURAGESIC - DOSED MCG/HR) 25 MCG/HR patch Place 1 patch (25 mcg total) onto the skin every 3 (three) days. 09/23/13  Yes [name], MD  HYDROcodone-acetaminophen (HYCET) 7.5-325 mg/15 ml solution Take 10 mLs by mouth every 6 (six) hours as needed for severe pain (or cough).  09/20/13  Yes Orma Flaming, MD  lisinopril-hydrochlorothiazide (PRINZIDE,ZESTORETIC) 20-25 MG per tablet Take 1 tablet by mouth daily. 09/13/13  Yes Posey Boyer, MD  ondansetron (ZOFRAN) 8 MG tablet Take 1 tablet (8 mg total) by mouth every 8 (eight) hours as needed for nausea. 09/23/13  Yes [name], MD  predniSONE (DELTASONE) 20 MG tablet Take 4 tablets (80 mg total) by mouth daily. Take on days 1-5 of chemotherapy. 09/23/13  Yes [name], MD  prochlorperazine (COMPAZINE) 10 MG tablet Take 1 tablet (10 mg total) by mouth every 6 (six) hours as needed (Nausea or vomiting). 09/23/13  Yes [name], MD  clindamycin (CLEOCIN) 300 MG capsule Take 1 capsule (300 mg total) by mouth 3 (three) times daily. 09/24/13  Carlisle Cater, PA-C   BP 126/75  Pulse 78  Temp(Src) 98.3 F (36.8 C) (Oral)  Resp 21  SpO2 93% Physical Exam  Nursing note and vitals reviewed. Constitutional: He is oriented to person, place, and time. He appears well-developed and well-nourished. No distress.  HENT:  Head: Normocephalic and atraumatic.  The oropharynx is noted to be post tonsillectomy of the right tonsil. He also has white patches to the tongue and oral mucosa.  There is significant swelling of the right lateral neck. There is a area of incision and drainage with  packing in place. There is continued purulent drainage and erythema to the area.  Neck: Normal range of motion. Neck supple.  Cardiovascular: Normal rate, regular rhythm and normal heart sounds.   No murmur heard. Pulmonary/Chest: Effort normal and breath sounds normal. No respiratory distress. He has no wheezes.  Abdominal: Soft. Bowel sounds are normal. He exhibits no distension. There is no tenderness.  Musculoskeletal: Normal range of motion. He exhibits no edema.  Lymphadenopathy:    He has no cervical adenopathy.  Neurological: He is alert and oriented to person, place, and time.  Skin: Skin is warm and dry. He is not diaphoretic.    ED Course  Procedures (including critical care time) Labs Review Labs Reviewed  CBC WITH DIFFERENTIAL - Abnormal; Notable for the following:    WBC 11.9 (*)    HCT 37.9 (*)    Neutro Abs 8.5 (*)    Monocytes Absolute 1.2 (*)    All other components within normal limits  COMPREHENSIVE METABOLIC PANEL - Abnormal; Notable for the following:    Sodium 135 (*)    Glucose, Bld 112 (*)    BUN 35 (*)    Albumin 2.8 (*)    AST 51 (*)    ALT 136 (*)    Alkaline Phosphatase 141 (*)    GFR calc non Af Amer 64 (*)    GFR calc Af Amer 74 (*)    All other components within normal limits    Imaging Review No results found.   EKG Interpretation None      MDM   Final diagnoses:  None    Patient with recently diagnosed B-cell Lymphoma, I and D of right neck abscess.  He presents with complaints of severe sore throat, drainage from neck wound, and inability to tolerate PO due to both.  His culture recently grew Salmonella.  Family states he is unable to eat or drink and were told he was bordering on renal failure.  Workup reveals elevated wbc and bun elevation.  I will consult medicine for admission and treatment of salmonella infection of the neck.  I have paged infectious disease to discuss the appropriate antibiotics, however received no  response.    Veryl Speak, MD 09/29/13 [id number]

## 2013-09-27 NOTE — ED Notes (Addendum)
Told to stop antibiotics on 9/21 r/t early stages of liver failure, kidney failure. Pt. Had biopsy on large mass of rt. Lateral neck.  Was here on 9/19 in ED for drainage of wound- green drainage. Today, was told to come to ED b/c pt. Wound culture showed salmonella growth. Difficulty swallowing when drinking fluids. Did go to [facility] and was told to follow our d/c instructions.  But, family received a call afterwards regarding that pt. Has Salmonella in wound.

## 2013-09-27 NOTE — Telephone Encounter (Signed)
Post ED Visit - Positive Culture Follow-up: Chart Hand-off to ED Flow Manager  Culture assessed and recommendations reviewed by: []  Wes [place], Pharm.D., BCPS []  Heide Guile, Pharm.D., BCPS []  Alycia Rossetti, Pharm.D., BCPS []  [place], Pharm.D., BCPS, AAHIVP []  Legrand Como, Pharm .D., BCPS, AAHIVP []  Hassie Bruce, Pharm.D. []  Milus Glazier, Pharm.D.  Positive neck wound culture + salmonella  []  Patient discharged without antimicrobial prescription and treatment is now indicated []  Organism is resistant to prescribed ED discharge antimicrobial []  Patient with positive blood cultures  Changes discussed with ED provider: sent to EDP 09/27/13 New antibiotic prescription   Norman [name] 09/27/2013, 3:20 PM

## 2013-09-27 NOTE — H&P (Signed)
[facility] History and Physical  Norman [name] UVO:[id number] DOB: 10/25/1950 DOA: 09/27/2013  Referring physician: ED physician PCP: No PCP Per Patient  Specialists:   Chief Complaint: right neck pain  HPI: Norman [name] is a 63 y.o. male without significant past medical history until recently diagnosed lymphoma, who presents with right neck pain.   The patient can not speak English and the history was obtained via translation by her daughter and daughter in law. According to patient's daughters, he was noticed to have lump in right side of neck in July. He was then evaluated in ED of [facility] [place]. He was empirically treated with antibiotics and was given a referral to ENT. After he took the antibiotics, he felt better and did not followup with ENT.   His neck lump has been growing bigger. On 09/10/13, he came to visit her daughter in [place], who noticed that his neck lump was very big. He was taken to the urgent care, where he did not receive any treatment, but was given referral to [facility] hospital for admission. he was admitted to the hospital on  09/10/13 and discharged with an arrangement of follow up with ENT. He was seen by ENT on 09/12/13 and was told that he could have cancer and needed a biopsy. Patient was also found to have enlargement of the right tonsil by ENT. On 09/21/13, he had a right tonsil removed by ENT due to the concern of air way obstruction. Biopsy was sent out, the report is pending. On 9/19, patient was evaluated by the radiologist in [facility] hospital, and was found to have possible infection over right neck. He was suggested to go to the emergency room, where he had I&D done to right neck. He was discharged home on antibiotics (clindamycine). He was seen by oncologist, Dr. Ernst Spell on 9/21 in the [place] [facility] the hospital. Per Dr. 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 He was  started with allopurinol. He is also on prednisone which was prescribed by ENT to prevent air obstruction. Because of an elevated creatine, he was told to stop taking clindamycin by Dr. Ernst Spell and instructed to take plenty amount of fluid at home.   Since patient has significant pain over his right neck and right side of throat, he could barely swallow food or drink enough fluid. Her daughter took patient to the emergency room for further evaluation and treatment today. Patient was found to have infection over the right side of the neck and leukocytosis. He was admitted to the inpatient for further evaluation and treatment. Of note, the culture from I&D is positive for salmonella.   Review of Systems: As presented in the history of presenting illness, rest negative.  Where does patient live?  Currently lives with her daughter in [place] Can patient participate in ADLs? Yes  Allergy:  Allergies  Allergen Reactions  . Penicillins Other (See Comments)    Unknown    Past Medical History  Diagnosis Date  . Arthritis   . Cataract   . Depression   . Cancer   . Hard of hearing   . Lymphoma     Past Surgical History  Procedure Laterality Date  . Mass biopsy Right 09/21/2013    Procedure: EXCISIONAL BIOPSY RIGHT NECK NODE;  Surgeon: Norman Marble, MD;  Location: [place];  Service: ENT;  Laterality: Right;  with frozen sections  . Tonsillectomy Right  09/21/2013    Procedure: RIGHT TONSILLECTOMY;  Surgeon: Norman Marble, MD;  Location: [place];  Service: ENT;  Laterality: Right;    Social History:  reports that he has never smoked. He has never used smokeless tobacco. He reports that he does not drink alcohol or use illicit drugs.  Family History:  Family History  Problem Relation Age of Onset  . Cancer Mother     Gi cancer  . Diabetes Sister      Prior to Admission medications   Medication Sig Start Date End Date Taking? Authorizing Provider  allopurinol (ZYLOPRIM) 300 MG tablet Take 1  tablet (300 mg total) by mouth daily. 09/23/13  Yes [name], MD  fentaNYL (DURAGESIC - DOSED MCG/HR) 25 MCG/HR patch Place 1 patch (25 mcg total) onto the skin every 3 (three) days. 09/23/13  Yes [name], MD  HYDROcodone-acetaminophen (HYCET) 7.5-325 mg/15 ml solution Take 10 mLs by mouth every 6 (six) hours as needed for severe pain (or cough).  09/20/13  Yes Orma Flaming, MD  lisinopril-hydrochlorothiazide (PRINZIDE,ZESTORETIC) 20-25 MG per tablet Take 1 tablet by mouth daily. 09/13/13  Yes Posey Boyer, MD  ondansetron (ZOFRAN) 8 MG tablet Take 1 tablet (8 mg total) by mouth every 8 (eight) hours as needed for nausea. 09/23/13  Yes [name], MD  predniSONE (DELTASONE) 20 MG tablet Take 4 tablets (80 mg total) by mouth daily. Take on days 1-5 of chemotherapy. 09/23/13  Yes [name], MD  prochlorperazine (COMPAZINE) 10 MG tablet Take 1 tablet (10 mg total) by mouth every 6 (six) hours as needed (Nausea or vomiting). 09/23/13  Yes [name], MD  clindamycin (CLEOCIN) 300 MG capsule Take 1 capsule (300 mg total) by mouth 3 (three) times daily. 09/24/13   Norman Cater, PA-C    Physical Exam: Filed Vitals:   09/27/13 2245 09/27/13 2300 09/27/13 2315 09/27/13 2330  BP: 133/71 117/52 124/64 107/50  Pulse: 72 84 70 63  Temp:      TempSrc:      Resp: 14 20 14 13   SpO2: 94% 92% 91% 93%   General: Not in acute distress HEENT:       Eyes: PERRL, EOMI, no scleral icterus       ENT and neck: s/p of R tonsillectomy. He also has white patches to the tongue which seems to be thrush. There is significant swelling of the right lateral neck. There is a area of incision and drainage with packing in place. There is yellow colored purulent drainage from the incision of previous I&D.  Cardiac: S1/S2, RRR, No murmurs, gallops or rubs Pulm: Good air movement bilaterally. Clear to auscultation bilaterally. No rales, wheezing, rhonchi or rubs. Abd: Soft, nondistended, nontender, no rebound pain, no  organomegaly, BS present Ext: No edema. 2+DP/PT pulse bilaterally Musculoskeletal: No joint deformities, erythema, or stiffness, ROM full Skin: No rashes.  Neuro: Alert and oriented X3, cranial nerves II-XII grossly intact, muscle strength 5/5 in all extremeties, sensation to light touch intact.  Psych: Patient is not psychotic, no suicidal or hemocidal ideation.  Labs on Admission:  Basic Metabolic Panel:  Recent Labs Lab 09/26/13 0952 09/27/13 1907  NA 139 135*  K 3.7 4.1  CL  --  96  CO2 26 26  GLUCOSE 136 112*  BUN 36.8* 35*  CREATININE 1.4* 1.18  CALCIUM 9.9 8.8   Liver Function Tests:  Recent Labs Lab 09/26/13 0952 09/27/13 1907  AST 103* 51*  ALT 205* 136*  ALKPHOS 145  141*  BILITOT 0.64 0.4  PROT 9.0* 7.9  ALBUMIN 2.9* 2.8*   No results found for this basename: LIPASE, AMYLASE,  in the last 168 hours No results found for this basename: AMMONIA,  in the last 168 hours CBC:  Recent Labs Lab 09/26/13 0951 09/27/13 1907  WBC 13.2* 11.9*  NEUTROABS 9.3* 8.5*  HGB 14.6 13.1  HCT 42.8 37.9*  MCV 86.6 85.4  PLT 398 365   Cardiac Enzymes: No results found for this basename: CKTOTAL, CKMB, CKMBINDEX, TROPONINI,  in the last 168 hours  BNP (last 3 results) No results found for this basename: PROBNP,  in the last 8760 hours CBG:  Recent Labs Lab 09/24/13 0942  GLUCAP 127*    Radiological Exams on Admission: No results found.  EKG: Independently reviewed.   Assessment/Plan Principal Problem:   Cellulitis of neck Active Problems:   At high risk of tumor lysis syndrome   Airway obstruction   Lymphoma   Neck infection  1. Cellulitis of the right neck: s/p of I&D. Positive for salmonella. Patient has leukocytosis with WBC 11.9. Currently hemodynamically stable. He is obviously immunosuppressed, currently on prednisone, at risk of developing severe sepsis.  - will admit to med-surg bed - start empiric antibiotics, Levaquin IV. ID was consulted by  ED, with followup recommendations for possible need of switching to other antibiotics - IVF: NS 150cc/h - blood culture x 2 - may need to call ENT or surgeon in AM - pain control  2. Lymphoma: Just evaluated by oncologist. Because of acute renal injury, scheduled chemotherapy was canceled by Dr.Ni - continue allopurinol to prevent tumor lysis syndrome. - follow up with Dr. Ernst Spell after discharge.  3. S/p R tonsillectomy: patient was started with prednisone by ENT. Currently no signs of airway obstruction. - will continue prednisone  4.  Thrush: will start nystatin  DVT ppx: SQ Heparin  Code Status: Full code Family Communication:  Yes, patient's daughter  at bed side Disposition Plan: Admit to inpatient  Ivor Costa [facility] Pager [phone]  If 7PM-7AM, please contact night-coverage [email or web address] Password [facility] 09/27/2013, 11:38 PM

## 2013-09-27 NOTE — Telephone Encounter (Signed)
Sent records to [facility] to Dr. Almond Lint

## 2013-09-27 NOTE — Telephone Encounter (Signed)
No additional note

## 2013-09-28 ENCOUNTER — Encounter (HOSPITAL_COMMUNITY): Payer: Self-pay | Admitting: Orthopedic Surgery

## 2013-09-28 ENCOUNTER — Ambulatory Visit: Payer: BC Managed Care – PPO

## 2013-09-28 ENCOUNTER — Other Ambulatory Visit (HOSPITAL_COMMUNITY): Payer: BC Managed Care – PPO

## 2013-09-28 ENCOUNTER — Telehealth: Payer: Self-pay | Admitting: *Deleted

## 2013-09-28 ENCOUNTER — Institutional Professional Consult (permissible substitution): Payer: Self-pay | Admitting: Radiation Oncology

## 2013-09-28 ENCOUNTER — Other Ambulatory Visit: Payer: BC Managed Care – PPO

## 2013-09-28 ENCOUNTER — Encounter: Payer: Self-pay | Admitting: *Deleted

## 2013-09-28 ENCOUNTER — Ambulatory Visit: Payer: BC Managed Care – PPO | Admitting: Hematology and Oncology

## 2013-09-28 ENCOUNTER — Other Ambulatory Visit: Payer: Self-pay | Admitting: Hematology and Oncology

## 2013-09-28 DIAGNOSIS — L0211 Cutaneous abscess of neck: Secondary | ICD-10-CM | POA: Diagnosis present

## 2013-09-28 LAB — HEPATIC FUNCTION PANEL
ALBUMIN: 2.6 g/dL — AB (ref 3.5–5.2)
ALT: 130 U/L — ABNORMAL HIGH (ref 0–53)
AST: 59 U/L — ABNORMAL HIGH (ref 0–37)
Alkaline Phosphatase: 130 U/L — ABNORMAL HIGH (ref 39–117)
Bilirubin, Direct: <0.2 mg/dL (ref 0.0–0.3)
TOTAL PROTEIN: 7.3 g/dL (ref 6.0–8.3)
Total Bilirubin: 0.3 mg/dL (ref 0.3–1.2)

## 2013-09-28 LAB — BASIC METABOLIC PANEL
ANION GAP: 13 (ref 5–15)
BUN: 26 mg/dL — ABNORMAL HIGH (ref 6–23)
CHLORIDE: 99 meq/L (ref 96–112)
CO2: 24 mEq/L (ref 19–32)
CREATININE: 0.92 mg/dL (ref 0.50–1.35)
Calcium: 8.5 mg/dL (ref 8.4–10.5)
GFR, EST NON AFRICAN AMERICAN: 88 mL/min — AB (ref >90)
Glucose, Bld: 148 mg/dL — ABNORMAL HIGH (ref 70–99)
Potassium: 4.6 mEq/L (ref 3.7–5.3)
Sodium: 136 mEq/L — ABNORMAL LOW (ref 137–147)

## 2013-09-28 LAB — CBC
HCT: 37.5 % — ABNORMAL LOW (ref 39.0–52.0)
Hemoglobin: 12.6 g/dL — ABNORMAL LOW (ref 13.0–17.0)
MCH: 29.4 pg (ref 26.0–34.0)
MCHC: 33.6 g/dL (ref 30.0–36.0)
MCV: 87.4 fL (ref 78.0–100.0)
PLATELETS: 359 10*3/uL (ref 150–400)
RBC: 4.29 MIL/uL (ref 4.22–5.81)
RDW: 12.7 % (ref 11.5–15.5)
WBC: 11 10*3/uL — ABNORMAL HIGH (ref 4.0–10.5)

## 2013-09-28 LAB — FLOW CYTOMETRY

## 2013-09-28 MED ORDER — SODIUM CHLORIDE 0.9 % IV SOLN: 250.0000 mL | INTRAVENOUS | Status: DC | PRN

## 2013-09-28 MED ORDER — SODIUM CHLORIDE 0.9 % IJ SOLN: 3.0000 mL | INTRAMUSCULAR | Status: DC | PRN

## 2013-09-28 MED ORDER — MORPHINE SULFATE ER 15 MG PO TBCR
15.0000 mg | EXTENDED_RELEASE_TABLET | Freq: Two times a day (BID) | ORAL | Status: DC
  Administered 2013-09-28 – 2013-10-03 (×11): 15 mg via ORAL
  Filled 2013-09-28 (×11): qty 1

## 2013-09-28 MED ORDER — MORPHINE SULFATE 15 MG PO TABS
15.0000 mg | ORAL_TABLET | Freq: Four times a day (QID) | ORAL | Status: DC
  Administered 2013-09-28 – 2013-10-03 (×17): 15 mg via ORAL
  Filled 2013-09-28 (×17): qty 1

## 2013-09-28 MED ORDER — SENNOSIDES-DOCUSATE SODIUM 8.6-50 MG PO TABS
2.0000 | ORAL_TABLET | Freq: Every day | ORAL | Status: DC
  Administered 2013-09-28 – 2013-09-30 (×3): 2 via ORAL
  Filled 2013-09-28 (×3): qty 2

## 2013-09-28 MED ORDER — PREDNISONE 20 MG PO TABS: 40.0000 mg/m2 | ORAL_TABLET | Freq: Every day | ORAL | Status: DC

## 2013-09-28 MED ORDER — HYDROCODONE-ACETAMINOPHEN 7.5-325 MG/15ML PO SOLN: 10.0000 mL | Freq: Four times a day (QID) | ORAL | Status: DC | PRN

## 2013-09-28 MED ORDER — PREDNISONE 20 MG PO TABS
40.0000 mg | ORAL_TABLET | Freq: Every day | ORAL | Status: DC
  Administered 2013-09-29 – 2013-10-03 (×5): 40 mg via ORAL
  Filled 2013-09-28 (×7): qty 2

## 2013-09-28 MED ORDER — SODIUM CHLORIDE 0.9 % IV SOLN
INTRAVENOUS | Status: DC
  Administered 2013-09-28 – 2013-09-30 (×5): via INTRAVENOUS

## 2013-09-28 MED ORDER — PREDNISONE 20 MG PO TABS
80.0000 mg | ORAL_TABLET | Freq: Every day | ORAL | Status: DC
  Administered 2013-09-28: 80 mg via ORAL
  Filled 2013-09-28: qty 4

## 2013-09-28 MED ORDER — PREDNISONE 20 MG PO TABS: 40.0000 mg | ORAL_TABLET | Freq: Every day | ORAL | Status: DC

## 2013-09-28 MED ORDER — HEPARIN SODIUM (PORCINE) 5000 UNIT/ML IJ SOLN
5000.0000 [IU] | Freq: Three times a day (TID) | INTRAMUSCULAR | Status: DC
  Administered 2013-09-28 – 2013-10-03 (×16): 5000 [IU] via SUBCUTANEOUS
  Filled 2013-09-28 (×17): qty 1

## 2013-09-28 MED ORDER — MORPHINE SULFATE 2 MG/ML IJ SOLN
2.0000 mg | INTRAMUSCULAR | Status: DC | PRN
  Administered 2013-09-28 – 2013-09-29 (×2): 2 mg via INTRAVENOUS
  Filled 2013-09-28: qty 1

## 2013-09-28 MED ORDER — ALLOPURINOL 300 MG PO TABS
300.0000 mg | ORAL_TABLET | Freq: Every day | ORAL | Status: DC
  Administered 2013-09-28 – 2013-10-03 (×6): 300 mg via ORAL
  Filled 2013-09-28 (×7): qty 1

## 2013-09-28 MED ORDER — SODIUM CHLORIDE 0.9 % IJ SOLN
3.0000 mL | Freq: Two times a day (BID) | INTRAMUSCULAR | Status: DC
  Administered 2013-09-30: 3 mL via INTRAVENOUS
  Administered 2013-10-01: 22:00:00 via INTRAVENOUS

## 2013-09-28 NOTE — Progress Notes (Addendum)
[facility]  63 y/o male with newly diagnosed diffuse Large B cell Lymphoma. He has had this mass like swelling in his right neck since July and did not follow up with ENT as advised until recently. 9/4 FNA - negative results  9/16He underwent a right tonsillectomy and lymph node bipseis - biopsy results reveal above mentioned Lymphoma.  9/19 seen in ER as incision in right neck from biopsy is draining, red and painful- Placed on Clindamycin 9/21- office visit with Dr Alvy Bimler- unable to start chemo as now has elevated Cr and LFTS (suspected to be from [place])- Clinda stopped- Bone marrow biopsy done in office- allopurinol started 9/22 seen in [facility] ER for second opinion and advised to continue to follow with oncologist at [facility]- culture from abscess reveals Salmonella, called to come back to ER  Principal Problem:   Cutaneous abscess of neck - in Cervical lymph node s/p biopsy- culture growing Salmonella- ID-  Dr Johnnye Sima advised to start antibiotics and d/c home - patient having significant pain and difficult swallowing- start MS Contin, MSIR and full liquids and follow in hospital for pain control and monitoring of PO intake.  - cont Prednisone 40 mg daily  Active Problems:    Lymphoma - spoke with Dr Alvy Bimler- she will consider treating him with [facility] next week if renal function and LFTs improve - will need an ECHO to start treatment- will order this     Elevated liver enzymes - following for improvement  Thrush - Nystatin  Debbe Odea, MD

## 2013-09-28 NOTE — Progress Notes (Signed)
Head and Neck Cancer Location of Tumor / Histology: *   Mr. Norman [name] presented with  a lump in the right side of his neck around July of this year. He was hospitalized. He was referred to ENT and was prescribed antibiotics but the swelling was worse.  09/09/2013, CT scan show significant oropharyngeal mass and lymphadenopathy. He underwent fine-needle aspirate and biopsy which was inconclusive.  His family members noted dysphagia since March of this year.   as of 09/26/13 He had lost weight and had anorexia.  Complained of severe for pain, rating his pain is 10 out of 10 pain.  On 09/21/2013, had a  repeat biopsy of the tonsillar mass and excisional lymph node biopsy of the right the neck. Biopsy is pending.   Biopsies of Right Level 2 Lymph Node/right Tonsil(if applicable) revealed:   09/21/13 Diagnosis 1. Lymph node for lymphoma, right level 2 lymph node - MOSTLY NECROTIC ATYPICAL LYMPHOID TISSUE. 2. Lymph node for lymphoma, right level 2 lymph node - MOSTLY NECROTIC ATYPICAL LYMPHOID TISSUE. 3. Lymph node for lymphoma, spinal - MOSTLY NECROTIC ATYPICAL LYMPHOID TISSUE. 4. Tonsil, right - DIFFUSE LARGE B CELL LYMPHOMA   Nutrition Status:  Weight changes:   Swallowing status: Dysphagia  Plans, if any, for PEG tube:   Tobacco/Marijuana/Snuff/ETOH use:   Past/Anticipated interventions by otolaryngology, if any: Dr. [name] - Biopsy of Lymph Nodes and Right Tonsil  Past/Anticipated interventions by medical oncology, if any: Dr. [name] - Bone Marrow Biopsy on 09/26/13.   ??? start R[facility]OP and IT Meth  Referrals yet, to any of the following?  Social Work?   Dentistry?   Nutrition?   Med/Onc?   PEG placement?   SAFETY ISSUES:  Prior radiation? No  Pacemaker/ICD? No  Possible current pregnancy? No  Is the patient on methotrexate? No  Current Complaints / other details:

## 2013-09-28 NOTE — ED Notes (Signed)
Flow called to inquire about bed request.  

## 2013-09-28 NOTE — ED Notes (Signed)
Pt placed on 3L 02, pt's 02 sats reading 78% on RA while pt is sleeping. This RN woke pt up, 02 sats 98% on RA while awake.

## 2013-09-28 NOTE — ED Notes (Signed)
Clear liquid diet ordered for patient

## 2013-09-28 NOTE — Telephone Encounter (Signed)
S/w pt's daughter in law, Norman [name], today.  Pt admitted to [facility] for infection. She asks what the plan is to follow w/ Dr. Alvy Bimler?  Instructed her to call us when she knows pt is being d/c'd from hospital.  Informed her Dr. Alvy Bimler was called by [facility] ED MD yesterday and told that pt was not happy w/ her care.  We thought he did not want to come back to see Dr. Alvy Bimler.  Norman [name] denies this,  States pt and family never told anyone they were unhappy w/ Dr. Calton Dach care. They thought they could go to [facility] ED for a second opinion.  They do have appt for second opinion there now.  But now pt admitted to [facility] for infection.  Informed her that Dr. Alvy Bimler was called by Dr. Wynelle [facility] this morning and notified of admission.  We then canceled his appts here for now. Pt cannot get treatment until his infection is taken care of.  She verbalized understanding.

## 2013-09-28 NOTE — Progress Notes (Signed)
Utilization review completed.  

## 2013-09-28 NOTE — Consult Note (Signed)
Norman [name], Norman [name] 63 y.o., male [id number]     Chief Complaint: throat pain, neck infection  HPI: 63 yo hm, known to me.  Underwent open biopsy right neck masses, and unilateral tonsillectomy for airway 1 week ago.  Has had severe throat pain.  I&D per Dr. Jearld [name] 5 days ago.  Culture shows Salmonella!  On Clindamycin per Dr. Jearld [name].  Questions of dehydration and possible acute renal injury.  I do not recall placing him on Prednisone except a single pre operative dose.  His tongue is white, but with no symptoms.   PMH: Past Medical History  Diagnosis Date  . Arthritis   . Cataract   . Depression   . Cancer   . Hard of hearing   . Lymphoma     Surg Hx: Past Surgical History  Procedure Laterality Date  . Mass biopsy Right 09/21/2013    Procedure: EXCISIONAL BIOPSY RIGHT NECK NODE;  Surgeon: Norman Shanks, MD;  Location: [facility] OR;  Service: ENT;  Laterality: Right;  with frozen sections  . Tonsillectomy Right 09/21/2013    Procedure: RIGHT TONSILLECTOMY;  Surgeon: Norman Shanks, MD;  Location: [facility] OR;  Service: ENT;  Laterality: Right;    FHx:   Family History  Problem Relation Age of Onset  . Cancer Mother     Gi cancer  . Diabetes Sister    SocHx:  reports that he has never smoked. He has never used smokeless tobacco. He reports that he does not drink alcohol or use illicit drugs.  ALLERGIES:  Allergies  Allergen Reactions  . Penicillins Other (See Comments)    Unknown     (Not in a hospital admission)  Results for orders placed during the hospital encounter of 09/27/13 (from the past 48 hour(s))  CBC WITH DIFFERENTIAL     Status: Abnormal   Collection Time    09/27/13  7:07 PM      Result Value Ref Range   WBC 11.9 (*) 4.0 - 10.5 K/uL   RBC 4.44  4.22 - 5.81 MIL/uL   Hemoglobin 13.1  13.0 - 17.0 g/dL   HCT 28.2 (*) 86.6 - 89.3 %   MCV 85.4  78.0 - 100.0 fL   MCH 29.5  26.0 - 34.0 pg   MCHC 34.6  30.0 - 36.0 g/dL   RDW 75.9  19.7 - 94.3 %   Platelets 365  150 - 400  K/uL   Neutrophils Relative % 71  43 - 77 %   Lymphocytes Relative 17  12 - 46 %   Monocytes Relative 10  3 - 12 %   Eosinophils Relative 1  0 - 5 %   Basophils Relative 1  0 - 1 %   Neutro Abs 8.5 (*) 1.7 - 7.7 K/uL   Lymphs Abs 2.0  0.7 - 4.0 K/uL   Monocytes Absolute 1.2 (*) 0.1 - 1.0 K/uL   Eosinophils Absolute 0.1  0.0 - 0.7 K/uL   Basophils Absolute 0.1  0.0 - 0.1 K/uL   Smear Review MORPHOLOGY UNREMARKABLE    COMPREHENSIVE METABOLIC PANEL     Status: Abnormal   Collection Time    09/27/13  7:07 PM      Result Value Ref Range   Sodium 135 (*) 137 - 147 mEq/L   Potassium 4.1  3.7 - 5.3 mEq/L   Chloride 96  96 - 112 mEq/L   CO2 26  19 - 32 mEq/L   Glucose, Bld 112 (*) 70 - 99 mg/dL  BUN 35 (*) 6 - 23 mg/dL   Creatinine, Ser 1.18  0.50 - 1.35 mg/dL   Calcium 8.8  8.4 - 10.5 mg/dL   Total Protein 7.9  6.0 - 8.3 g/dL   Albumin 2.8 (*) 3.5 - 5.2 g/dL   AST 51 (*) 0 - 37 U/L   ALT 136 (*) 0 - 53 U/L   Alkaline Phosphatase 141 (*) 39 - 117 U/L   Total Bilirubin 0.4  0.3 - 1.2 mg/dL   GFR calc non Af Amer 64 (*) >90 mL/min   GFR calc Af Amer 74 (*) >90 mL/min   Comment: (NOTE)     The eGFR has been calculated using the CKD EPI equation.     This calculation has not been validated in all clinical situations.     eGFR's persistently <90 mL/min signify possible Chronic Kidney     Disease.   Anion gap 13  5 - 15  BASIC METABOLIC PANEL     Status: Abnormal   Collection Time    09/28/13  5:50 AM      Result Value Ref Range   Sodium 136 (*) 137 - 147 mEq/L   Potassium 4.6  3.7 - 5.3 mEq/L   Chloride 99  96 - 112 mEq/L   CO2 24  19 - 32 mEq/L   Glucose, Bld 148 (*) 70 - 99 mg/dL   BUN 26 (*) 6 - 23 mg/dL   Creatinine, Ser 0.92  0.50 - 1.35 mg/dL   Calcium 8.5  8.4 - 10.5 mg/dL   GFR calc non Af Amer 88 (*) >90 mL/min   GFR calc Af Amer >90  >90 mL/min   Comment: (NOTE)     The eGFR has been calculated using the CKD EPI equation.     This calculation has not been validated  in all clinical situations.     eGFR's persistently <90 mL/min signify possible Chronic Kidney     Disease.   Anion gap 13  5 - 15  CBC     Status: Abnormal   Collection Time    09/28/13  5:50 AM      Result Value Ref Range   WBC 11.0 (*) 4.0 - 10.5 K/uL   RBC 4.29  4.22 - 5.81 MIL/uL   Hemoglobin 12.6 (*) 13.0 - 17.0 g/dL   HCT 37.5 (*) 39.0 - 52.0 %   MCV 87.4  78.0 - 100.0 fL   MCH 29.4  26.0 - 34.0 pg   MCHC 33.6  30.0 - 36.0 g/dL   RDW 12.7  11.5 - 15.5 %   Platelets 359  150 - 400 K/uL   No results found.   Blood pressure 142/62, pulse 70, temperature 97.8 F (36.6 C), temperature source Oral, resp. rate 12, SpO2 97.00%.  PHYSICAL EXAM: Overall appearance:  stocky Head: dressing RIGHT neck Ears:not examined Nose:  Not examined Oral Cavity: tongue with whitish coating representing elongation of filliform papillae. Oral Pharynx/Hypopharynx/Larynx  Not examined Neuro:  Grossly intact Neck:  I&D site with Nu Guaze and minimal drainage.  Indurated from lymphadenopathy     Assessment/Plan   Lymphoma.  Salmonella wound infection, which suggests an underlying immune status problem.  I do not think he has thrush.    Daughter in law is concerned that he cannot eat or drink, and if he goes home like this is likely to get into further trouble.  I reassured her that between the Hospitalists, and Dr. Alvy [name], and the ID  doctors, they will make sure he is on  A healthy track before letting him go home.    I will follow with you.  Probably remove ribbon guaze tomorrow.  [name] 3/83/7793, 10:44 AM

## 2013-09-29 ENCOUNTER — Ambulatory Visit: Payer: BC Managed Care – PPO

## 2013-09-29 ENCOUNTER — Encounter (HOSPITAL_COMMUNITY): Payer: Self-pay | Admitting: General Practice

## 2013-09-29 ENCOUNTER — Encounter: Payer: Self-pay | Admitting: *Deleted

## 2013-09-29 LAB — COMPREHENSIVE METABOLIC PANEL
ALT: 102 U/L — AB (ref 0–53)
AST: 34 U/L (ref 0–37)
Albumin: 2.4 g/dL — ABNORMAL LOW (ref 3.5–5.2)
Alkaline Phosphatase: 101 U/L (ref 39–117)
Anion gap: 8 (ref 5–15)
BUN: 19 mg/dL (ref 6–23)
CHLORIDE: 103 meq/L (ref 96–112)
CO2: 28 meq/L (ref 19–32)
Calcium: 8.6 mg/dL (ref 8.4–10.5)
Creatinine, Ser: 1.05 mg/dL (ref 0.50–1.35)
GFR, EST AFRICAN AMERICAN: 85 mL/min — AB (ref >90)
GFR, EST NON AFRICAN AMERICAN: 74 mL/min — AB (ref >90)
GLUCOSE: 83 mg/dL (ref 70–99)
Potassium: 4.1 mEq/L (ref 3.7–5.3)
SODIUM: 139 meq/L (ref 137–147)
Total Bilirubin: 0.2 mg/dL — ABNORMAL LOW (ref 0.3–1.2)
Total Protein: 6.7 g/dL (ref 6.0–8.3)

## 2013-09-29 LAB — CBC
HCT: 32.9 % — ABNORMAL LOW (ref 39.0–52.0)
Hemoglobin: 11.1 g/dL — ABNORMAL LOW (ref 13.0–17.0)
MCH: 28.9 pg (ref 26.0–34.0)
MCHC: 33.7 g/dL (ref 30.0–36.0)
MCV: 85.7 fL (ref 78.0–100.0)
PLATELETS: 338 10*3/uL (ref 150–400)
RBC: 3.84 MIL/uL — ABNORMAL LOW (ref 4.22–5.81)
RDW: 12.7 % (ref 11.5–15.5)
WBC: 10.6 10*3/uL — ABNORMAL HIGH (ref 4.0–10.5)

## 2013-09-29 MED ORDER — HYDRALAZINE HCL 20 MG/ML IJ SOLN: 5.0000 mg | INTRAMUSCULAR | Status: DC | PRN

## 2013-09-29 MED ORDER — MORPHINE SULFATE 2 MG/ML IJ SOLN
2.0000 mg | Freq: Once | INTRAMUSCULAR | Status: AC
  Filled 2013-09-29: qty 1

## 2013-09-29 MED ORDER — MORPHINE BOLUS VIA INFUSION: 2.0000 mg | Freq: Once | INTRAVENOUS | Status: DC

## 2013-09-29 NOTE — Progress Notes (Signed)
[facility] PROGRESS NOTE  Norman [name] OVF:[id number] DOB: 09/13/1950 DOA: 09/27/2013 PCP: No PCP Per Patient Interim Summary: 63 y/o male with newly diagnosed diffuse Large B cell Lymphoma. He has had this mass like swelling in his right neck since July and did not follow up with ENT as advised until recently.  Assessment/Plan: Cutaneous abscess of neck  - in Cervical lymph node s/p biopsy- culture growing Salmonella- ID- Dr Johnnye Sima advised to start antibiotics .  - patient having significant pain and difficult swallowing- start MS Contin, MSIR and full liquids and follow in hospital for pain control and monitoring of PO intake. Advance diet as tolerated.  - cont Prednisone 40 mg daily. Lymphoma  - spoke with Dr Alvy Bimler- she will consider treating him with CHOP next week if renal function and LFTs improve  - will need an ECHO to start treatment-   Elevated liver enzymes  - following for improvement  Thrush  - Nystatin        Code Status: full code.  Family Communication: family at bedside.  Disposition Plan: pending.    Consultants:  ENT   ID  Procedures: 9/4 FNA - negative results  9/16He underwent a right tonsillectomy and lymph node bipseis - biopsy results reveal above mentioned Lymphoma.  9/19 seen in ER as incision in right neck from biopsy is draining, red and painful- Placed on Clindamycin  9/21- office visit with Dr Alvy Bimler- unable to start chemo as now has elevated Cr and LFTS (suspected to be from [place])- [place] stopped- Bone marrow biopsy done in office- allopurinol started  9/22 seen in [facility] ER for second opinion and advised to continue to follow with oncologist at [facility]- culture from abscess reveals Salmonella, called to come back to ER   Antibiotics:  Iv LEVAQUIN.  HPI/Subjective: Worsening pain in the neck, just completed dressing.   Objective: Filed Vitals:   09/29/13 1608  BP: 163/74  Pulse: 64  Temp: 97.6 F (36.4 C)  Resp: 16     Intake/Output Summary (Last 24 hours) at 09/29/13 1729 Last data filed at 09/29/13 1235  Gross per 24 hour  Intake 3647.5 ml  Output    600 ml  Net 3047.5 ml   There were no vitals filed for this visit.  Exam:   General:  Alert afebrile, comfortable  Cardiovascular: s1s2  Respiratory: ctab  Abdomen: soft non tender bs+  Musculoskeletal: no pedal edema.  Data Reviewed: Basic Metabolic Panel:  Recent Labs Lab 09/26/13 0952 09/27/13 1907 09/28/13 0550 09/29/13 0610  NA 139 135* 136* 139  K 3.7 4.1 4.6 4.1  CL  --  96 99 103  CO2 $Re'26 26 24 28  'MtX$ GLUCOSE 136 112* 148* 83  BUN 36.8* 35* 26* 19  CREATININE 1.4* 1.18 0.92 1.05  CALCIUM 9.9 8.8 8.5 8.6   Liver Function Tests:  Recent Labs Lab 09/26/13 0952 09/27/13 1907 09/28/13 0833 09/29/13 0610  AST 103* 51* 59* 34  ALT 205* 136* 130* 102*  ALKPHOS 145 141* 130* 101  BILITOT 0.64 0.4 0.3 0.2*  PROT 9.0* 7.9 7.3 6.7  ALBUMIN 2.9* 2.8* 2.6* 2.4*   No results found for this basename: LIPASE, AMYLASE,  in the last 168 hours No results found for this basename: AMMONIA,  in the last 168 hours CBC:  Recent Labs Lab 09/26/13 0951 09/27/13 1907 09/28/13 0550 09/29/13 0610  WBC 13.2* 11.9* 11.0* 10.6*  NEUTROABS 9.3* 8.5*  --   --   HGB 14.6 13.1 12.6* 11.1*  HCT 42.8 37.9* 37.5* 32.9*  MCV 86.6 85.4 87.4 85.7  PLT 398 365 359 338   Cardiac Enzymes: No results found for this basename: CKTOTAL, CKMB, CKMBINDEX, TROPONINI,  in the last 168 hours BNP (last 3 results) No results found for this basename: PROBNP,  in the last 8760 hours CBG:  Recent Labs Lab 09/24/13 0942  GLUCAP 127*    Recent Results (from the past 240 hour(s))  WOUND CULTURE     Status: None   Collection Time    09/24/13  1:51 PM      Result Value Ref Range Status   Specimen Description WOUND NECK   Final   Special Requests Normal   Final   Gram Stain     Final   Value: RARE WBC PRESENT, PREDOMINANTLY PMN     RARE SQUAMOUS  EPITHELIAL CELLS PRESENT     NO ORGANISMS SEEN     Performed at [facility]   Culture     Final   Value: MODERATE SALMONELLA SPECIES     Note: CRITICAL RESULT CALLED TO, READ BACK BY AND VERIFIED WITH: LYNN MILLER 9/22 $RemoveBef'[email or web address]$  BY REAMM FAXED TO [facility] ATT CONNIE WEANT 09/27/13 AT 94 PM BY PEAKY Referred to [facility] in [place], [place] for Serotyping.     Performed at [facility]   Report Status PENDING   Incomplete   Organism ID, Bacteria SALMONELLA SPECIES   Final  TECHNOLOGIST REVIEW     Status: None   Collection Time    09/26/13  9:51 AM      Result Value Ref Range Status   Technologist Review Variant lymphs present   Final  CULTURE, BLOOD (ROUTINE X 2)     Status: None   Collection Time    09/27/13 11:43 PM      Result Value Ref Range Status   Specimen Description BLOOD RIGHT HAND   Final   Special Requests BOTTLES DRAWN AEROBIC ONLY 5CC   Final   Culture  Setup Time     Final   Value: 09/28/2013 08:37     Performed at [facility]   Culture     Final   Value:        BLOOD CULTURE RECEIVED NO GROWTH TO DATE CULTURE WILL BE HELD FOR 5 DAYS BEFORE ISSUING A FINAL NEGATIVE REPORT     Performed at [facility]   Report Status PENDING   Incomplete  CULTURE, BLOOD (ROUTINE X 2)     Status: None   Collection Time    09/27/13 11:46 PM      Result Value Ref Range Status   Specimen Description BLOOD LEFT ARM   Final   Special Requests BOTTLES DRAWN AEROBIC AND ANAEROBIC 5CC   Final   Culture  Setup Time     Final   Value: 09/28/2013 08:37     Performed at [facility]   Culture     Final   Value:        BLOOD CULTURE RECEIVED NO GROWTH TO DATE CULTURE WILL BE HELD FOR 5 DAYS BEFORE ISSUING A FINAL NEGATIVE REPORT     Performed at [facility]   Report Status PENDING   Incomplete     Studies: No results found.  Scheduled Meds: . allopurinol  300 mg Oral Daily  . heparin  5,000 Units  Subcutaneous 3 times per day  . levofloxacin (LEVAQUIN) IV  750 mg Intravenous  Q24H  . morphine  15 mg Oral Q12H  . morphine  15 mg Oral Q6H  . nystatin  5 mL Mouth/Throat QID  . predniSONE  40 mg Oral Q breakfast  . senna-docusate  2 tablet Oral QHS  . sodium chloride  3 mL Intravenous Q12H   Continuous Infusions: . sodium chloride 150 mL/hr at 09/29/13 1414    Principal Problem:   Cutaneous abscess of neck Active Problems:   At high risk of tumor lysis syndrome   Elevated liver enzymes   Lymphoma   Oral thrush    Time spent: 25 min    Norman [name]  [facility] Pager [phone] If 7PM-7AM, please contact night-coverage at [email or web address], password [employer] 09/29/2013, 5:29 PM  LOS: 2 days

## 2013-09-29 NOTE — Op Note (Signed)
09/29/2013  12:58 PM    [name], Norman Lima  [id number]   Pre-Op Dx:  RIGHT neck wound infection  Post-op Dx: same  Proc: I&D RIGHT neck wound   Surg:  [name] T MD  Anes: IV morphine  EBL:  none  Comp:   none  Findings:  Tan pus in several deep loculations RIGHT neck  Procedure: The pt was given 2 mg IV MSO4.  The wound was fully opened, and pus was expressed.  Several deep loculations were packed loosely with 1/2" iodoform nu gauze.  4" Kerlix wrap applied.  Pt tol well  Dispo:   No change  Plan:  Will change dressing once daily, re pack wound every other day.  Would recommend keeping him in hospital until drainage much less.  Tyson Alias MD 2

## 2013-09-29 NOTE — Progress Notes (Signed)
09/29/2013 12:15 PM  Contino, Norman [name] [id number]  Hosp Day 3    Temp:  [97.9 F (36.6 C)-98.5 F (36.9 C)] 98.1 F (36.7 C) (09/24 0349) Pulse Rate:  [60-67] 62 (09/24 0349) Resp:  [16] 16 (09/24 0349) BP: (112-138)/(56-70) 116/63 mmHg (09/24 0349) SpO2:  [91 %-98 %] 98 % (09/24 0349),     Intake/Output Summary (Last 24 hours) at 09/29/13 1215 Last data filed at 09/29/13 0859  Gross per 24 hour  Intake 3647.5 ml  Output    650 ml  Net 2997.5 ml    Results for orders placed during the hospital encounter of 09/27/13 (from the past 24 hour(s))  COMPREHENSIVE METABOLIC PANEL     Status: Abnormal   Collection Time    09/29/13  6:10 AM      Result Value Ref Range   Sodium 139  137 - 147 mEq/L   Potassium 4.1  3.7 - 5.3 mEq/L   Chloride 103  96 - 112 mEq/L   CO2 28  19 - 32 mEq/L   Glucose, Bld 83  70 - 99 mg/dL   BUN 19  6 - 23 mg/dL   Creatinine, Ser 1.05  0.50 - 1.35 mg/dL   Calcium 8.6  8.4 - 10.5 mg/dL   Total Protein 6.7  6.0 - 8.3 g/dL   Albumin 2.4 (*) 3.5 - 5.2 g/dL   AST 34  0 - 37 U/L   ALT 102 (*) 0 - 53 U/L   Alkaline Phosphatase 101  39 - 117 U/L   Total Bilirubin 0.2 (*) 0.3 - 1.2 mg/dL   GFR calc non Af Amer 74 (*) >90 mL/min   GFR calc Af Amer 85 (*) >90 mL/min   Anion gap 8  5 - 15  CBC     Status: Abnormal   Collection Time    09/29/13  6:10 AM      Result Value Ref Range   WBC 10.6 (*) 4.0 - 10.5 K/uL   RBC 3.84 (*) 4.22 - 5.81 MIL/uL   Hemoglobin 11.1 (*) 13.0 - 17.0 g/dL   HCT 32.9 (*) 39.0 - 52.0 %   MCV 85.7  78.0 - 100.0 fL   MCH 28.9  26.0 - 34.0 pg   MCHC 33.7  30.0 - 36.0 g/dL   RDW 12.7  11.5 - 15.5 %   Platelets 338  150 - 400 K/uL    SUBJECTIVE:  Less pain.  Advancing oral intake  OBJECTIVE:  Still fairly copious pus coming from RIGHT neck wound.  Minimal residual ribbon gauze removed  IMPRESSION:  RIGHT neck would infection  PLAN:  Will further open his neck wound and re-pack with Nu Gauze.  Keep on IV abx until much less  wound drainage.  [name]

## 2013-09-29 NOTE — Progress Notes (Signed)
Patient requested dressing be changed.  Informed patient that there was no order to change.  Applied gauze and tape to current dressing to ease patient's anxiety about potential drainage and incision appearance.

## 2013-09-30 ENCOUNTER — Institutional Professional Consult (permissible substitution): Payer: Self-pay | Admitting: Radiation Oncology

## 2013-09-30 ENCOUNTER — Institutional Professional Consult (permissible substitution): Payer: BC Managed Care – PPO | Admitting: Radiation Oncology

## 2013-09-30 ENCOUNTER — Ambulatory Visit: Payer: BC Managed Care – PPO

## 2013-09-30 DIAGNOSIS — I519 Heart disease, unspecified: Secondary | ICD-10-CM

## 2013-09-30 LAB — COMPREHENSIVE METABOLIC PANEL
ALT: 109 U/L — AB (ref 0–53)
AST: 40 U/L — AB (ref 0–37)
Albumin: 2.6 g/dL — ABNORMAL LOW (ref 3.5–5.2)
Alkaline Phosphatase: 105 U/L (ref 39–117)
Anion gap: 10 (ref 5–15)
BUN: 17 mg/dL (ref 6–23)
CALCIUM: 8.8 mg/dL (ref 8.4–10.5)
CO2: 29 mEq/L (ref 19–32)
Chloride: 102 mEq/L (ref 96–112)
Creatinine, Ser: 1 mg/dL (ref 0.50–1.35)
GFR calc non Af Amer: 78 mL/min — ABNORMAL LOW (ref >90)
GLUCOSE: 71 mg/dL (ref 70–99)
Potassium: 4.1 mEq/L (ref 3.7–5.3)
SODIUM: 141 meq/L (ref 137–147)
TOTAL PROTEIN: 7.1 g/dL (ref 6.0–8.3)
Total Bilirubin: 0.3 mg/dL (ref 0.3–1.2)

## 2013-09-30 LAB — CBC
HCT: 36 % — ABNORMAL LOW (ref 39.0–52.0)
Hemoglobin: 12 g/dL — ABNORMAL LOW (ref 13.0–17.0)
MCH: 28.6 pg (ref 26.0–34.0)
MCHC: 33.3 g/dL (ref 30.0–36.0)
MCV: 85.9 fL (ref 78.0–100.0)
PLATELETS: 346 10*3/uL (ref 150–400)
RBC: 4.19 MIL/uL — AB (ref 4.22–5.81)
RDW: 12.8 % (ref 11.5–15.5)
WBC: 9.7 10*3/uL (ref 4.0–10.5)

## 2013-09-30 MED ORDER — SODIUM CHLORIDE 0.9 % IV SOLN
INTRAVENOUS | Status: AC
  Administered 2013-09-30: 23:00:00 via INTRAVENOUS

## 2013-09-30 NOTE — Progress Notes (Signed)
[facility] PROGRESS NOTE  Norman [name] QDI:[id number] DOB: [birth year] DOA: 09/27/2013 PCP: No PCP Per Patient  Interim Summary:  63 y/o male with newly diagnosed diffuse Large B cell Lymphoma. He has had this mass like swelling in his right neck since July and did not follow up with ENT as advised until recently. Follows with Dr. Kerrie Pleasure and has been noncompliant with her as well.   Subjective  In bed, denies headache, no chest abdominal pain, no shortness of breath, no diarrhea or focal weakness. Stable right neck discomfort.    Assessment/Plan:   Cutaneous abscess of neck Right side - post Cervical lymph node s/p biopsy- culture growing Salmonella- ID- Dr Johnnye Sima advised to start Levaquin. Seen by ENT physician and underwent incision and drainage on 407 68/08 by Dr.Wolicki, and daily dressing changes, pain control, gradually improving. No he came in with 40 mg of prednisone which is unaware of wine. For now will be continued.   He was on prednisone for lymph nodes compressing his airway, currently continued. Will defer tapering off to oncology.     Lymphoma - Dr Wynelle Cleveland spoke with Dr Alvy Bimler- she will consider treating him with [facility] next week if renal function and LFTs improve , will need an ECHO to start treatment, Echo pending.    Elevated liver enzymes lymphoma. - Improving.   Thrush  - Nystatin        Code Status: full code.  Family Communication: family at bedside.  Disposition Plan: pending.    Consultants:  ENT   ID  Procedures: 9/4 FNA - negative results  9/16He underwent a right tonsillectomy and lymph node bipseis - biopsy results reveal above mentioned Lymphoma.  9/19 seen in ER as incision in right neck from biopsy is draining, red and painful- Placed on Clindamycin  9/21- office visit with Dr Alvy Bimler- unable to start chemo as now has elevated Cr and LFTS (suspected to be from [place])- [place] stopped- Bone marrow biopsy done in office-  allopurinol started  9/22 seen in [facility] ER for second opinion and advised to continue to follow with oncologist at [facility]- culture from abscess reveals Salmonella, called to come back to ER 9/24 by ENT physician Dr. Erik Obey incision and drainage right neck wound infection     Anti-infectives   Start     Dose/Rate Route Frequency Ordered Stop   09/28/13 0000  levofloxacin (LEVAQUIN) IVPB 750 mg     750 mg 100 mL/hr over 90 Minutes Intravenous Every 24 hours 09/27/13 2345          Objective: Filed Vitals:   09/30/13 0500  BP: 159/76  Pulse: 63  Temp:   Resp: 16    Intake/Output Summary (Last 24 hours) at 09/30/13 1017 Last data filed at 09/30/13 0900  Gross per 24 hour  Intake   3240 ml  Output   1500 ml  Net   1740 ml   There were no vitals filed for this visit.  Exam:   General:  Alert afebrile, comfortable  Cardiovascular: s1s2  Respiratory: ctab  Abdomen: soft non tender bs+  Musculoskeletal: no pedal edema.  Data Reviewed: Basic Metabolic Panel:  Recent Labs Lab 09/26/13 0952 09/27/13 1907 09/28/13 0550 09/29/13 0610 09/30/13 0630  NA 139 135* 136* 139 141  [name] 3.7 4.1 4.6 4.1 4.1  CL  --  96 99 103 102  CO2 $Re'26 26 24 28 29  'MRG$ GLUCOSE 136 112* 148* 83 71  BUN 36.8* 35* 26* 19 17  CREATININE  1.4* 1.18 0.92 1.05 1.00  CALCIUM 9.9 8.8 8.5 8.6 8.8   Liver Function Tests:  Recent Labs Lab 09/26/13 0952 09/27/13 1907 09/28/13 0833 09/29/13 0610 09/30/13 0630  AST 103* 51* 59* 34 40*  ALT 205* 136* 130* 102* 109*  ALKPHOS 145 141* 130* 101 105  BILITOT 0.64 0.4 0.3 0.2* 0.3  PROT 9.0* 7.9 7.3 6.7 7.1  ALBUMIN 2.9* 2.8* 2.6* 2.4* 2.6*   No results found for this basename: LIPASE, AMYLASE,  in the last 168 hours No results found for this basename: AMMONIA,  in the last 168 hours CBC:  Recent Labs Lab 09/26/13 0951 09/27/13 1907 09/28/13 0550 09/29/13 0610 09/30/13 0630  WBC 13.2* 11.9* 11.0* 10.6* 9.7  NEUTROABS 9.3* 8.5*  --   --   --    HGB 14.6 13.1 12.6* 11.1* 12.0*  HCT 42.8 37.9* 37.5* 32.9* 36.0*  MCV 86.6 85.4 87.4 85.7 85.9  PLT 398 365 359 338 346   Cardiac Enzymes: No results found for this basename: CKTOTAL, CKMB, CKMBINDEX, TROPONINI,  in the last 168 hours BNP (last 3 results) No results found for this basename: PROBNP,  in the last 8760 hours CBG:  Recent Labs Lab 09/24/13 0942  GLUCAP 127*    Recent Results (from the past 240 hour(s))  WOUND CULTURE     Status: None   Collection Time    09/24/13  1:51 PM      Result Value Ref Range Status   Specimen Description WOUND NECK   Final   Special Requests Normal   Final   Gram Stain     Final   Value: RARE WBC PRESENT, PREDOMINANTLY PMN     RARE SQUAMOUS EPITHELIAL CELLS PRESENT     NO ORGANISMS SEEN     Performed at [facility]   Culture     Final   Value: MODERATE SALMONELLA SPECIES     Note: CRITICAL RESULT CALLED TO, READ BACK BY AND VERIFIED WITH: LYNN MILLER 9/22 $RemoveBef'@[email or web address]$  BY REAMM FAXED TO [facility] ATT CONNIE WEANT 09/27/13 AT 515 PM BY PEAKY Referred to [facility] in [place], [place] for Serotyping.     Performed at [facility]   Report Status PENDING   Incomplete   Organism ID, Bacteria SALMONELLA SPECIES   Final  TECHNOLOGIST REVIEW     Status: None   Collection Time    09/26/13  9:51 AM      Result Value Ref Range Status   Technologist Review Variant lymphs present   Final  CULTURE, BLOOD (ROUTINE X 2)     Status: None   Collection Time    09/27/13 11:43 PM      Result Value Ref Range Status   Specimen Description BLOOD RIGHT HAND   Final   Special Requests BOTTLES DRAWN AEROBIC ONLY 5CC   Final   Culture  Setup Time     Final   Value: 09/28/2013 08:37     Performed at [facility]   Culture     Final   Value:        BLOOD CULTURE RECEIVED NO GROWTH TO DATE CULTURE WILL BE HELD FOR 5 DAYS BEFORE ISSUING A FINAL NEGATIVE REPORT     Performed at [facility]   Report  Status PENDING   Incomplete  CULTURE, BLOOD (ROUTINE X 2)     Status: None   Collection Time    09/27/13 11:46 PM  Result Value Ref Range Status   Specimen Description BLOOD LEFT ARM   Final   Special Requests BOTTLES DRAWN AEROBIC AND ANAEROBIC 5CC   Final   Culture  Setup Time     Final   Value: 09/28/2013 08:37     Performed at [facility]   Culture     Final   Value:        BLOOD CULTURE RECEIVED NO GROWTH TO DATE CULTURE WILL BE HELD FOR 5 DAYS BEFORE ISSUING A FINAL NEGATIVE REPORT     Performed at [facility]   Report Status PENDING   Incomplete     Studies: No results found.  Scheduled Meds: . allopurinol  300 mg Oral Daily  . heparin  5,000 Units Subcutaneous 3 times per day  . levofloxacin (LEVAQUIN) IV  750 mg Intravenous Q24H  . morphine  15 mg Oral Q12H  . morphine  15 mg Oral Q6H  . nystatin  5 mL Mouth/Throat QID  . predniSONE  40 mg Oral Q breakfast  . senna-docusate  2 tablet Oral QHS  . sodium chloride  3 mL Intravenous Q12H   Continuous Infusions: . sodium chloride      Principal Problem:   Cutaneous abscess of neck Active Problems:   At high risk of tumor lysis syndrome   Elevated liver enzymes   Lymphoma   Oral thrush    Time spent: 25 min    Norman [name]  [facility] Pager [phone] If 7PM-7AM, please contact night-coverage at [email or web address], password [facility] 09/30/2013, 10:17 AM  LOS: 3 days

## 2013-09-30 NOTE — Progress Notes (Signed)
09/30/2013 9:29 AM  [name], Norman [name]  Hospital Day 4    Temp:  [97.6 F (36.4 C)-97.9 F (36.6 C)] 97.9 F (36.6 C) (09/24 1924) Pulse Rate:  [60-64] 63 (09/25 0500) Resp:  [16] 16 (09/25 0500) BP: (154-163)/(71-76) 159/76 mmHg (09/25 0500) SpO2:  [96 %-99 %] 99 % (09/25 0500),     Intake/Output Summary (Last 24 hours) at 09/30/13 0929 Last data filed at 09/30/13 0900  Gross per 24 hour  Intake   3240 ml  Output   1500 ml  Net   1740 ml    Results for orders placed during the hospital encounter of 09/27/13 (from the past 24 hour(s))  COMPREHENSIVE METABOLIC PANEL     Status: Abnormal   Collection Time    09/30/13  6:30 AM      Result Value Ref Range   Sodium 141  137 - 147 mEq/L   Potassium 4.1  3.7 - 5.3 mEq/L   Chloride 102  96 - 112 mEq/L   CO2 29  19 - 32 mEq/L   Glucose, Bld 71  70 - 99 mg/dL   BUN 17  6 - 23 mg/dL   Creatinine, Ser 1.00  0.50 - 1.35 mg/dL   Calcium 8.8  8.4 - 10.5 mg/dL   Total Protein 7.1  6.0 - 8.3 g/dL   Albumin 2.6 (*) 3.5 - 5.2 g/dL   AST 40 (*) 0 - 37 U/L   ALT 109 (*) 0 - 53 U/L   Alkaline Phosphatase 105  39 - 117 U/L   Total Bilirubin 0.3  0.3 - 1.2 mg/dL   GFR calc non Af Amer 78 (*) >90 mL/min   GFR calc Af Amer >90  >90 mL/min   Anion gap 10  5 - 15  CBC     Status: Abnormal   Collection Time    09/30/13  6:30 AM      Result Value Ref Range   WBC 9.7  4.0 - 10.5 K/uL   RBC 4.19 (*) 4.22 - 5.81 MIL/uL   Hemoglobin 12.0 (*) 13.0 - 17.0 g/dL   HCT 36.0 (*) 39.0 - 52.0 %   MCV 85.9  78.0 - 100.0 fL   MCH 28.6  26.0 - 34.0 pg   MCHC 33.3  30.0 - 36.0 g/dL   RDW 12.8  11.5 - 15.5 %   Platelets 346  150 - 400 K/uL    SUBJECTIVE:  Feeling better. Eating some solid foods  OBJECTIVE:    Less neck drainage and tenderness.  Dressing changed.  Tonsil fossa healing. Tongue coated due to no solid foods  IMPRESSION:  Improved.  I do not think he has oral thrush, just coating from absence of the scouring effect of eating  solid foods.    PLAN:  Will change wound packing qOD.  Advance diet and activity.  [name]

## 2013-09-30 NOTE — Evaluation (Signed)
Physical Therapy Evaluation Patient Details Name: Jacier Gladu MRN: [id number] DOB: 1950/06/28 Today's Date: 09/30/2013   History of Present Illness  Patient is a 63 y/o male with large B cell lymphoma admitted with neck abcess after biopsy.  Clinical Impression  Patient already walking in hallway this morning per his son.  Feel he is close to baseline for mobility.  No PT needs identifies.  Will sign off.    Follow Up Recommendations No PT follow up    Equipment Recommendations  None recommended by PT    Recommendations for Other Services       Precautions / Restrictions Precautions Precautions: None      Mobility  Bed Mobility Overal bed mobility: Modified Independent                Transfers Overall transfer level: Modified independent Equipment used: None                Ambulation/Gait Ambulation/Gait assistance: Independent Ambulation Distance (Feet): 400 Feet Assistive device: None Gait Pattern/deviations: WFL(Within Functional Limits)   Gait velocity interpretation: <1.8 ft/sec, indicative of risk for recurrent falls    Stairs Stairs: Yes Stairs assistance: Independent Stair Management: No rails Number of Stairs: 4    Wheelchair Mobility    Modified Rankin (Stroke Patients Only)       Balance Overall balance assessment: No apparent balance deficits (not formally assessed)                                           Pertinent Vitals/Pain Pain Assessment: No/denies pain    Home Living Family/patient expects to be discharged to:: Private residence Living Arrangements: Children Available Help at Discharge: Family Type of Home: House Home Access: Stairs to enter Entrance Stairs-Rails: Right Entrance Stairs-Number of Steps: 3 Home Layout: One level Home Equipment: None      Prior Function                 Hand Dominance   Dominant Hand: Right    Extremity/Trunk Assessment   Upper Extremity  Assessment: Overall WFL for tasks assessed           Lower Extremity Assessment: Overall WFL for tasks assessed         Communication   Communication: Prefers language other than English (son present to translate)  Cognition Arousal/Alertness: Awake/alert Behavior During Therapy: WFL for tasks assessed/performed Overall Cognitive Status: Within Functional Limits for tasks assessed                      General Comments General comments (skin integrity, edema, etc.): neck with dressing around, turning head for environmental scanning without difficulty    Exercises        Assessment/Plan    PT Assessment Patent does not need any further PT services  PT Diagnosis     PT Problem List    PT Treatment Interventions     PT Goals (Current goals can be found in the Care Plan section) Acute Rehab PT Goals PT Goal Formulation: No goals set, d/c therapy    Frequency     Barriers to discharge        Co-evaluation               End of Session   Activity Tolerance: Patient tolerated treatment well Patient left: in bed;with call bell/phone within reach;with family/visitor  present           Time:  -      Charges:   PT Evaluation $Initial PT Evaluation Tier I: 1 Procedure PT Treatments $Gait Training: 8-22 mins   PT G Codes:          WYNN,CYNDI Oct 20, 2013, 4:05 PM  Magda Kiel, [place] 10-20-13

## 2013-09-30 NOTE — Progress Notes (Signed)
  Echocardiogram 2D Echocardiogram has been performed.  Diamond Nickel 09/30/2013, 10:53 AM

## 2013-10-01 MED ORDER — BISACODYL 5 MG PO TBEC
10.0000 mg | DELAYED_RELEASE_TABLET | Freq: Every day | ORAL | Status: DC
Start: 2013-10-01 — End: 2013-10-03
  Administered 2013-10-01 – 2013-10-03 (×3): 10 mg via ORAL
  Filled 2013-10-01 (×3): qty 2

## 2013-10-01 MED ORDER — LEVOFLOXACIN 750 MG PO TABS
750.0000 mg | ORAL_TABLET | Freq: Every day | ORAL | Status: DC
  Administered 2013-10-01 – 2013-10-02 (×2): 750 mg via ORAL
  Filled 2013-10-01 (×3): qty 1

## 2013-10-01 MED ORDER — DOCUSATE SODIUM 100 MG PO CAPS
200.0000 mg | ORAL_CAPSULE | Freq: Two times a day (BID) | ORAL | Status: DC
  Administered 2013-10-01 – 2013-10-03 (×5): 200 mg via ORAL
  Filled 2013-10-01 (×6): qty 2

## 2013-10-01 MED ORDER — POLYETHYLENE GLYCOL 3350 17 G PO PACK
17.0000 g | PACK | Freq: Two times a day (BID) | ORAL | Status: DC
  Administered 2013-10-01 – 2013-10-03 (×4): 17 g via ORAL
  Filled 2013-10-01 (×9): qty 1

## 2013-10-01 NOTE — Progress Notes (Signed)
[facility] PROGRESS NOTE  Norman [name] PNT:[id number] DOB: 07/13/1950 DOA: 09/27/2013 PCP: No PCP Per Patient  Interim Summary:  63 y/o male with newly diagnosed diffuse Large B cell Lymphoma. He has had this mass like swelling in his right neck since July and did not follow up with ENT as advised until recently. Follows with Dr. Kerrie Pleasure and has been noncompliant with her as well.   Subjective  In bed, denies headache, no chest abdominal pain, no shortness of breath, no diarrhea or focal weakness. Stable right neck discomfort.    Assessment/Plan:   Cutaneous abscess of neck Right side - post Cervical lymph node s/p biopsy- culture growing Salmonella- ID- Dr Johnnye Sima advised to start Levaquin. Seen by ENT physician and underwent incision and drainage on 0/86/7619 by Dr.Wolicki, he recommends daily dressing changes by ENT to Monday, pain control, gradually improving.     Lymphoma - Dr Wynelle Cleveland spoke with Dr Alvy Bimler- she will consider treating him with CHOP next week if renal function and LFTs improve , stable echo which was requested by oncology.He was on prednisone for lymph nodes compressing his airway, currently continued. Will defer tapering off to oncology.    Elevated liver enzymes lymphoma. - Improving.   Thrush  - Nystatin    Chronic grade 1 diastolic CHF. EF 55-60%.  Compensated.      Code Status: full code.  Family Communication: family at bedside.  Disposition Plan: pending.    Consultants:  ENT   ID  Procedures: 9/4 FNA - negative results  9/16He underwent a right tonsillectomy and lymph node bipseis - biopsy results reveal above mentioned Lymphoma.  9/19 seen in ER as incision in right neck from biopsy is draining, red and painful- Placed on Clindamycin  9/21- office visit with Dr Alvy Bimler- unable to start chemo as now has elevated Cr and LFTS (suspected to be from [place])- [place] stopped- Bone marrow biopsy done in office- allopurinol started   9/22 seen in [facility] ER for second opinion and advised to continue to follow with oncologist at [facility]- culture from abscess reveals Salmonella, called to come back to ER 9/24 by ENT physician Dr. Erik Obey incision and drainage right neck wound infection   Echo  - Left ventricle: The cavity size was normal. Wall thickness was normal. Systolic function was normal. The estimated ejection fraction was in the range of 55% to 60%. Wall motion was normal; there were no regional wall motion abnormalities. Doppler parameters are consistent with abnormal left ventricular relaxation (grade 1 diastolic dysfunction).  Impressions:  - Normal LV function; grade 1 diastolic dysfunction.     Anti-infectives   Start     Dose/Rate Route Frequency Ordered Stop   09/28/13 0000  levofloxacin (LEVAQUIN) IVPB 750 mg     750 mg 100 mL/hr over 90 Minutes Intravenous Every 24 hours 09/27/13 2345          Objective: Filed Vitals:   10/01/13 0553  BP: 146/81  Pulse: 54  Temp: 97.5 F (36.4 C)  Resp: 18    Intake/Output Summary (Last 24 hours) at 10/01/13 0957 Last data filed at 10/01/13 0259  Gross per 24 hour  Intake 1203.33 ml  Output    700 ml  Net 503.33 ml   There were no vitals filed for this visit.  Exam:   General:  Alert afebrile, comfortable  Cardiovascular: s1s2  Respiratory: ctab  Abdomen: soft non tender bs+  Musculoskeletal: no pedal edema.  Data Reviewed: Basic Metabolic Panel:  Recent Labs  Lab 09/26/13 0952 09/27/13 1907 09/28/13 0550 09/29/13 0610 09/30/13 0630  NA 139 135* 136* 139 141  K 3.7 4.1 4.6 4.1 4.1  CL  --  96 99 103 102  CO2 _0 GLUCOSE 136 112* 148* 83 71  BUN 36.8* 35* 26* 19 17  CREATININE 1.4* 1.18 0.92 1.05 1.00  CALCIUM 9.9 8.8 8.5 8.6 8.8   Liver Function Tests:  Recent Labs Lab 09/26/13 0952 09/27/13 1907 09/28/13 0833 09/29/13 0610 09/30/13 0630  AST 103* 51* 59* 34 40*  ALT 205* 136* 130* 102* 109*  ALKPHOS 145  141* 130* 101 105  BILITOT 0.64 0.4 0.3 0.2* 0.3  PROT 9.0* 7.9 7.3 6.7 7.1  ALBUMIN 2.9* 2.8* 2.6* 2.4* 2.6*   No results found for this basename: LIPASE, AMYLASE,  in the last 168 hours No results found for this basename: AMMONIA,  in the last 168 hours CBC:  Recent Labs Lab 09/26/13 0951 09/27/13 1907 09/28/13 0550 09/29/13 0610 09/30/13 0630  WBC 13.2* 11.9* 11.0* 10.6* 9.7  NEUTROABS 9.3* 8.5*  --   --   --   HGB 14.6 13.1 12.6* 11.1* 12.0*  HCT 42.8 37.9* 37.5* 32.9* 36.0*  MCV 86.6 85.4 87.4 85.7 85.9  PLT 398 365 359 338 346   Cardiac Enzymes: No results found for this basename: CKTOTAL, CKMB, CKMBINDEX, TROPONINI,  in the last 168 hours BNP (last 3 results) No results found for this basename: PROBNP,  in the last 8760 hours CBG: No results found for this basename: GLUCAP,  in the last 168 hours  Recent Results (from the past 240 hour(s))  WOUND CULTURE     Status: None   Collection Time    09/24/13  1:51 PM      Result Value Ref Range Status   Specimen Description WOUND NECK   Final   Special Requests Normal   Final   Gram Stain     Final   Value: RARE WBC PRESENT, PREDOMINANTLY PMN     RARE SQUAMOUS EPITHELIAL CELLS PRESENT     NO ORGANISMS SEEN     Performed at [facility]   Culture     Final   Value: MODERATE SALMONELLA SPECIES     Note: CRITICAL RESULT CALLED TO, READ BACK BY AND VERIFIED WITH: LYNN MILLER 9/22 _1  BY REAMM FAXED TO [facility] ATT CONNIE WEANT 09/27/13 AT 515 PM BY PEAKY Referred to [facility] in [place], [place] for Serotyping.     Performed at [facility]   Report Status PENDING   Incomplete   Organism ID, Bacteria SALMONELLA SPECIES   Final  TECHNOLOGIST REVIEW     Status: None   Collection Time    09/26/13  9:51 AM      Result Value Ref Range Status   Technologist Review Variant lymphs present   Final  CULTURE, BLOOD (ROUTINE X 2)     Status: None   Collection Time    09/27/13  11:43 PM      Result Value Ref Range Status   Specimen Description BLOOD RIGHT HAND   Final   Special Requests BOTTLES DRAWN AEROBIC ONLY 5CC   Final   Culture  Setup Time     Final   Value: 09/28/2013 08:37     Performed at [facility]   Culture     Final   Value:        BLOOD CULTURE RECEIVED  NO GROWTH TO DATE CULTURE WILL BE HELD FOR 5 DAYS BEFORE ISSUING A FINAL NEGATIVE REPORT     Performed at [facility]   Report Status PENDING   Incomplete  CULTURE, BLOOD (ROUTINE X 2)     Status: None   Collection Time    09/27/13 11:46 PM      Result Value Ref Range Status   Specimen Description BLOOD LEFT ARM   Final   Special Requests BOTTLES DRAWN AEROBIC AND ANAEROBIC 5CC   Final   Culture  Setup Time     Final   Value: 09/28/2013 08:37     Performed at [facility]   Culture     Final   Value:        BLOOD CULTURE RECEIVED NO GROWTH TO DATE CULTURE WILL BE HELD FOR 5 DAYS BEFORE ISSUING A FINAL NEGATIVE REPORT     Performed at [facility]   Report Status PENDING   Incomplete     Studies: No results found.  Scheduled Meds: . allopurinol  300 mg Oral Daily  . bisacodyl  10 mg Oral Daily  . docusate sodium  200 mg Oral BID  . heparin  5,000 Units Subcutaneous 3 times per day  . levofloxacin (LEVAQUIN) IV  750 mg Intravenous Q24H  . morphine  15 mg Oral Q12H  . morphine  15 mg Oral Q6H  . nystatin  5 mL Mouth/Throat QID  . polyethylene glycol  17 g Oral BID  . predniSONE  40 mg Oral Q breakfast  . sodium chloride  3 mL Intravenous Q12H   Continuous Infusions:    Principal Problem:   Cutaneous abscess of neck Active Problems:   At high risk of tumor lysis syndrome   Elevated liver enzymes   Lymphoma   Oral thrush    Time spent: 25 min    SINGH,PRASHANT K  [facility] Pager [phone] If 7PM-7AM, please contact night-coverage at [email or web address], password [facility] 10/01/2013, 9:57 AM  LOS: 4 days

## 2013-10-01 NOTE — Progress Notes (Signed)
   ENT Progress Note: HD #5   Subjective: tol po, decreased pain  Objective: Vital signs in last 24 hours: Temp:  [97.3 F (36.3 C)-98.4 F (36.9 C)] 97.5 F (36.4 C) (09/26 0553) Pulse Rate:  [54-66] 54 (09/26 0553) Resp:  [17-18] 18 (09/26 0553) BP: (146-153)/(81-87) 146/81 mmHg (09/26 0553) SpO2:  [94 %-99 %] 94 % (09/26 0553) Weight change:  Last BM Date: 09/28/13  Intake/Output from previous day: 09/25 0701 - 09/26 0700 In: 1443.3 [P.O.:1080; I.V.:213.3; IV Piggyback:150] Out: 700 [Urine:700] Intake/Output this shift:    Labs:  Recent Labs  09/29/13 0610 09/30/13 0630  WBC 10.6* 9.7  HGB 11.1* 12.0*  HCT 32.9* 36.0*  PLT 338 346    Recent Labs  09/29/13 0610 09/30/13 0630  NA 139 141  K 4.1 4.1  CL 103 102  CO2 28 29  GLUCOSE 83 71  BUN 19 17  CALCIUM 8.6 8.8    Studies/Results: No results found.   PHYSICAL EXAM: Inc intact Dressing/packing removed and changed  Min purulent d/c, min erythema and swelling   Assessment/Plan: Pt improving with current care Cont iv Abx Re-evaluate wound progression Monday, if stable may d/c.    [place], Laurianne Floresca 10/01/2013, 9:54 AM

## 2013-10-02 NOTE — Progress Notes (Signed)
[facility] PROGRESS NOTE  Norman [name] BBC:[id number] DOB: 1950/03/01 DOA: 09/27/2013 PCP: No PCP Per Patient  Interim Summary:  63 y/o male with newly diagnosed diffuse Large B cell Lymphoma. He has had this mass like swelling in his right neck since July and did not follow up with ENT as advised until recently. Follows with Dr. Kerrie Pleasure and has been noncompliant with her as well.   Subjective  In bed, denies headache, no chest abdominal pain, no shortness of breath, no diarrhea or focal weakness. Stable right neck discomfort.    Assessment/Plan:   Cutaneous abscess of neck Right side - post Cervical lymph node s/p biopsy- culture growing Salmonella- ID- Dr Johnnye Sima advised to start Levaquin. Seen by ENT physician and underwent incision and drainage on 05/08/8880 by Dr.Wolicki, he recommends daily dressing changes by ENT to Monday, pain control, gradually improving.     Lymphoma - Dr Wynelle Cleveland spoke with Dr Claretha Cooper- she will consider treating him with CHOP next week if renal function and LFTs improve , stable echo which was requested by oncology.He was on prednisone for lymph nodes compressing his airway, currently continued. Will defer tapering off to oncology.    Elevated liver enzymes lymphoma. - Improving.   Thrush  - Nystatin    Chronic grade 1 diastolic CHF. EF 55-60%.  Compensated.      Code Status: full code.  Family Communication: no family at bedside.  Disposition Plan: pending.    Consultants:  ENT   ID  Procedures: 9/4 FNA - negative results  9/16He underwent a right tonsillectomy and lymph node bipseis - biopsy results reveal above mentioned Lymphoma.  9/19 seen in ER as incision in right neck from biopsy is draining, red and painful- Placed on Clindamycin  9/21- office visit with Dr Alvy Bimler- unable to start chemo as now has elevated Cr and LFTS (suspected to be from [place])- [place] stopped- Bone marrow biopsy done in office- allopurinol  started  9/22 seen in [facility] ER for second opinion and advised to continue to follow with oncologist at [facility]- culture from abscess reveals Salmonella, called to come back to ER 9/24 by ENT physician Dr. Erik Obey incision and drainage right neck wound infection   Echo  - Left ventricle: The cavity size was normal. Wall thickness was normal. Systolic function was normal. The estimated ejection fraction was in the range of 55% to 60%. Wall motion was normal; there were no regional wall motion abnormalities. Doppler parameters are consistent with abnormal left ventricular relaxation (grade 1 diastolic dysfunction).  Impressions:  - Normal LV function; grade 1 diastolic dysfunction.     Anti-infectives   Start     Dose/Rate Route Frequency Ordered Stop   10/01/13 2200  levofloxacin (LEVAQUIN) tablet 750 mg     750 mg Oral Daily 10/01/13 1409     09/28/13 0000  levofloxacin (LEVAQUIN) IVPB 750 mg  Status:  Discontinued     750 mg 100 mL/hr over 90 Minutes Intravenous Every 24 hours 09/27/13 2345 10/01/13 1409        Objective: Filed Vitals:   10/02/13 0525  BP: 144/81  Pulse: 56  Temp: 98.2 F (36.8 C)  Resp: 18    Intake/Output Summary (Last 24 hours) at 10/02/13 8003 Last data filed at 10/01/13 2000  Gross per 24 hour  Intake    960 ml  Output      0 ml  Net    960 ml   There were no vitals filed for this visit.  Exam:   General:  Alert afebrile, comfortable  Cardiovascular: s1s2  Respiratory: ctab  Abdomen: soft non tender bs+  Musculoskeletal: no pedal edema.  Data Reviewed: Basic Metabolic Panel:  Recent Labs Lab 09/26/13 0952 09/27/13 1907 09/28/13 0550 09/29/13 0610 09/30/13 0630  NA 139 135* 136* 139 141  K 3.7 4.1 4.6 4.1 4.1  CL  --  96 99 103 102  CO2 _0 GLUCOSE 136 112* 148* 83 71  BUN 36.8* 35* 26* 19 17  CREATININE 1.4* 1.18 0.92 1.05 1.00  CALCIUM 9.9 8.8 8.5 8.6 8.8   Liver Function Tests:  Recent Labs Lab  09/26/13 0952 09/27/13 1907 09/28/13 0833 09/29/13 0610 09/30/13 0630  AST 103* 51* 59* 34 40*  ALT 205* 136* 130* 102* 109*  ALKPHOS 145 141* 130* 101 105  BILITOT 0.64 0.4 0.3 0.2* 0.3  PROT 9.0* 7.9 7.3 6.7 7.1  ALBUMIN 2.9* 2.8* 2.6* 2.4* 2.6*   No results found for this basename: LIPASE, AMYLASE,  in the last 168 hours No results found for this basename: AMMONIA,  in the last 168 hours CBC:  Recent Labs Lab 09/26/13 0951 09/27/13 1907 09/28/13 0550 09/29/13 0610 09/30/13 0630  WBC 13.2* 11.9* 11.0* 10.6* 9.7  NEUTROABS 9.3* 8.5*  --   --   --   HGB 14.6 13.1 12.6* 11.1* 12.0*  HCT 42.8 37.9* 37.5* 32.9* 36.0*  MCV 86.6 85.4 87.4 85.7 85.9  PLT 398 365 359 338 346   Cardiac Enzymes: No results found for this basename: CKTOTAL, CKMB, CKMBINDEX, TROPONINI,  in the last 168 hours BNP (last 3 results) No results found for this basename: PROBNP,  in the last 8760 hours CBG: No results found for this basename: GLUCAP,  in the last 168 hours  Recent Results (from the past 240 hour(s))  WOUND CULTURE     Status: None   Collection Time    09/24/13  1:51 PM      Result Value Ref Range Status   Specimen Description WOUND NECK   Final   Special Requests Normal   Final   Gram Stain     Final   Value: RARE WBC PRESENT, PREDOMINANTLY PMN     RARE SQUAMOUS EPITHELIAL CELLS PRESENT     NO ORGANISMS SEEN     Performed at [facility]   Culture     Final   Value: MODERATE SALMONELLA SPECIES     Note: CRITICAL RESULT CALLED TO, READ BACK BY AND VERIFIED WITH: LYNN MILLER 9/22 _1  BY REAMM FAXED TO [facility] ATT CONNIE WEANT 09/27/13 AT 515 PM BY PEAKY Referred to [facility] in [place], [place] for Serotyping.     Performed at [facility]   Report Status PENDING   Incomplete   Organism ID, Bacteria SALMONELLA SPECIES   Final  TECHNOLOGIST REVIEW     Status: None   Collection Time    09/26/13  9:51 AM      Result Value Ref  Range Status   Technologist Review Variant lymphs present   Final  CULTURE, BLOOD (ROUTINE X 2)     Status: None   Collection Time    09/27/13 11:43 PM      Result Value Ref Range Status   Specimen Description BLOOD RIGHT HAND   Final   Special Requests BOTTLES DRAWN AEROBIC ONLY 5CC   Final   Culture  Setup Time     Final  Value: 09/28/2013 08:37     Performed at [facility]   Culture     Final   Value:        BLOOD CULTURE RECEIVED NO GROWTH TO DATE CULTURE WILL BE HELD FOR 5 DAYS BEFORE ISSUING A FINAL NEGATIVE REPORT     Performed at [facility]   Report Status PENDING   Incomplete  CULTURE, BLOOD (ROUTINE X 2)     Status: None   Collection Time    09/27/13 11:46 PM      Result Value Ref Range Status   Specimen Description BLOOD LEFT ARM   Final   Special Requests BOTTLES DRAWN AEROBIC AND ANAEROBIC 5CC   Final   Culture  Setup Time     Final   Value: 09/28/2013 08:37     Performed at [facility]   Culture     Final   Value:        BLOOD CULTURE RECEIVED NO GROWTH TO DATE CULTURE WILL BE HELD FOR 5 DAYS BEFORE ISSUING A FINAL NEGATIVE REPORT     Performed at [facility]   Report Status PENDING   Incomplete     Studies: No results found.  Scheduled Meds: . allopurinol  300 mg Oral Daily  . bisacodyl  10 mg Oral Daily  . docusate sodium  200 mg Oral BID  . heparin  5,000 Units Subcutaneous 3 times per day  . levofloxacin  750 mg Oral Daily  . morphine  15 mg Oral Q12H  . morphine  15 mg Oral Q6H  . nystatin  5 mL Mouth/Throat QID  . polyethylene glycol  17 g Oral BID  . predniSONE  40 mg Oral Q breakfast  . sodium chloride  3 mL Intravenous Q12H   Continuous Infusions:    Principal Problem:   Cutaneous abscess of neck Active Problems:   At high risk of tumor lysis syndrome   Elevated liver enzymes   Lymphoma   Oral thrush    Time spent: 25 min    Norman [name] K  [facility] Pager [phone] If 7PM-7AM,  please contact night-coverage at [email or web address], password [facility] 10/02/2013, 9:03 AM  LOS: 5 days

## 2013-10-03 ENCOUNTER — Telehealth: Payer: Self-pay | Admitting: *Deleted

## 2013-10-03 LAB — TISSUE HYBRIDIZATION (BONE MARROW)-NCBH

## 2013-10-03 LAB — CHROMOSOME ANALYSIS, BONE MARROW

## 2013-10-03 MED ORDER — "DRESSING SPONGES 4""X4"" PADS": MEDICATED_PAD | Status: DC

## 2013-10-03 MED ORDER — HYDROCODONE-ACETAMINOPHEN 7.5-325 MG/15ML PO SOLN: 10.0000 mL | Freq: Four times a day (QID) | ORAL | Status: DC | PRN

## 2013-10-03 MED ORDER — LEVOFLOXACIN 750 MG PO TABS: 750.0000 mg | ORAL_TABLET | Freq: Every day | ORAL | Status: DC

## 2013-10-03 NOTE — Discharge Instructions (Signed)
Follow with Primary MD  in 7 days , Keep your Neck clean and dry at all times  Get CBC, CMP, 2 view Chest X ray checked  by Primary MD next visit.    Activity: As tolerated with Full fall precautions use walker/cane & assistance as needed   Disposition Home     Diet: Heart Healthy    For Heart failure patients - Check your Weight same time everyday, if you gain over 2 pounds, or you develop in leg swelling, experience more shortness of breath or chest pain, call your Primary MD immediately. Follow Cardiac Low Salt Diet and 1.8 lit/day fluid restriction.   On your next visit with her primary care physician please Get Medicines reviewed and adjusted.  Please request your Prim.MD to go over all Hospital Tests and Procedure/Radiological results at the follow up, please get all Hospital records sent to your Prim MD by signing hospital release before you go home.   If you experience worsening of your admission symptoms, develop shortness of breath, life threatening emergency, suicidal or homicidal thoughts you must seek medical attention immediately by calling 911 or calling your MD immediately  if symptoms less severe.  You Must read complete instructions/literature along with all the possible adverse reactions/side effects for all the Medicines you take and that have been prescribed to you. Take any new Medicines after you have completely understood and accpet all the possible adverse reactions/side effects.   Do not drive, operating heavy machinery, perform activities at heights, swimming or participation in water activities or provide baby sitting services if your were admitted for syncope or siezures until you have seen by Primary MD or a Neurologist and advised to do so again.  Do not drive when taking Pain medications.    Do not take more than prescribed Pain, Sleep and Anxiety Medications  Special Instructions: If you have smoked or chewed Tobacco  in the last 2 yrs please stop  smoking, stop any regular Alcohol  and or any Recreational drug use.  Wear Seat belts while driving.   Please note  You were cared for by a hospitalist during your hospital stay. If you have any questions about your discharge medications or the care you received while you were in the hospital after you are discharged, you can call the unit and asked to speak with the hospitalist on call if the hospitalist that took care of you is not available. Once you are discharged, your primary care physician will handle any further medical issues. Please note that NO REFILLS for any discharge medications will be authorized once you are discharged, as it is imperative that you return to your primary care physician (or establish a relationship with a primary care physician if you do not have one) for your aftercare needs so that they can reassess your need for medications and monitor your lab values.

## 2013-10-03 NOTE — Discharge Summary (Signed)
Norman [name], is a 63 y.o. male  DOB [birth year]  MRN [id number].  Admission date:  09/27/2013  Admitting Physician  Ivor Costa, MD  Discharge Date:  10/03/2013   Primary MD  No PCP Per Patient  Recommendations for primary care physician for things to follow:   Check CBC, BMP in a week. Monitor wound closely.  He must follow with ENT physician Dr. Aletha Halim the key in oncologist Dr. Alvy Bimler closely.   Admission Diagnosis  Cellulitis of neck [682.1] Lymphoma [202.80] At high risk of tumor lysis syndrome [V49.89]   Discharge Diagnosis  Cellulitis of neck [682.1] Lymphoma [202.80] At high risk of tumor lysis syndrome [V49.89]     Principal Problem:   Cutaneous abscess of neck Active Problems:   At high risk of tumor lysis syndrome   Elevated liver enzymes   Lymphoma   Oral thrush      Past Medical History  Diagnosis Date  . Arthritis   . Cataract   . Depression   . Cancer   . Hard of hearing   . Lymphoma     Past Surgical History  Procedure Laterality Date  . Mass biopsy Right 09/21/2013    Procedure: EXCISIONAL BIOPSY RIGHT NECK NODE;  Surgeon: [name], MD;  Location: [place];  Service: ENT;  Laterality: Right;  with frozen sections  . Tonsillectomy Right 09/21/2013    Procedure: RIGHT TONSILLECTOMY;  Surgeon: [name], MD;  Location: [place];  Service: ENT;  Laterality: Right;  . Tonsillectomy         History of present illness and  Hospital Course:     Kindly see H&P for history of present illness and admission details, please review complete Labs, Consult reports and Test reports for all details in brief  HPI  from the history and physical done on the day of admission   Norman [name] is a 63 y.o. male without significant past medical history until recently diagnosed lymphoma, who presents with  right neck pain.  The patient can not speak English and the history was obtained via translation by her daughter and daughter in law. According to patient's daughters, he was noticed to have lump in right side of neck in July. He was then evaluated in ED of [facility]. He was empirically treated with antibiotics and was given a referral to ENT. After he took the antibiotics, he felt better and did not followup with ENT.    His neck lump has been growing bigger. On 09/10/13, he came to visit her daughter in [place], who noticed that his neck lump was very big. He was taken to the urgent care, where he did not receive any treatment, but was given referral to [facility] hospital for admission. he was admitted to the hospital on 09/10/13 and discharged with an arrangement of follow up with ENT. He was seen by ENT on 09/12/13 and was told that he could have cancer and needed a biopsy. Patient was also found to have enlargement of  the right tonsil by ENT. On 09/21/13, he had a right tonsil removed by ENT due to the concern of air way obstruction. Biopsy was sent out, the report is pending. On 9/19, patient was evaluated by the radiologist in [facility] hospital, and was found to have possible infection over right neck. He was suggested to go to the emergency room, where he had I&D done to right neck. He was discharged home on antibiotics (clindamycine). He was seen by oncologist, Dr. Ernst Spell on 9/21 in the [facility] the hospital. Per Dr. 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 He was started with allopurinol. He is also on prednisone which was prescribed by ENT to prevent air obstruction. Because of an elevated creatine, he was told to stop taking clindamycin by Dr. Ernst Spell and instructed to take plenty amount of fluid at home.    Since patient has significant pain over his right neck and right side of throat, he could barely swallow  food or drink enough fluid. Her daughter took patient to the emergency room for further evaluation and treatment today. Patient was found to have infection over the right side of the neck and leukocytosis. He was admitted to the inpatient for further evaluation and treatment. Of note, the culture from I&D is positive for salmonella.     Hospital Course   Cutaneous abscess of neck Right side - post Cervical lymph node s/p biopsy- culture growing Salmonella- ID- Dr Johnnye Sima advised to start Levaquin. Seen by ENT physician and underwent incision and drainage on 8/46/9629 by Dr.Wolicki, discussed the case bedside with Dr. Delia Chimes on 52/84/1324, dressing changed wound looks better stable for discharge at home. He will see him again in 2 days.   Lymphoma - I spoke with patient's oncologist Dr Claretha Cooper today, she will call him for followup appointment in the next few days.He was on prednisone for lymph nodes compressing his airway, currently continued. Will defer tapering off to oncology. Echo prior to chemotherapy obtained as below.    Elevated liver enzymes lymphoma.  - Improving.    Thrush  - completed Rx with Nystatin    Chronic grade 1 diastolic CHF. EF 55-60%.  Compensated.   Echo   - Left ventricle: The cavity size was normal. Wall thickness was normal. Systolic function was normal. The estimated ejection fraction was in the range of 55% to 60%. Wall motion was normal; there were no regional wall motion abnormalities. Doppler parameters are consistent with abnormal left ventricular relaxation (grade 1 diastolic dysfunction).  Impressions:  Normal LV function; grade 1 diastolic dysfunction.     Discharge Condition: stable   Follow UP  Follow-up Information   Follow up with [facility], [place], MD. Schedule an appointment as soon as possible for a visit in 3 days.   Specialty:  Hematology and Oncology   Contact information:   [place] [id number] [phone]        Follow up with [name], MD. Schedule an appointment as soon as possible for a visit in 1 day.   Specialty:  Otolaryngology   Contact information:   [address] [place] [address] [phone]         Discharge Instructions  and  Discharge Medications      Discharge Instructions   Discharge instructions    Complete by:  As directed   Follow with Primary MD  in 7 days ,  Keep your Neck clean and dry at all times  Get CBC, CMP, 2 view Chest X ray checked  by Primary MD next visit.    Activity: As tolerated with Full fall precautions use walker/cane & assistance as needed   Disposition Home     Diet: Heart Healthy    For Heart failure patients - Check your Weight same time everyday, if you gain over 2 pounds, or you develop in leg swelling, experience more shortness of breath or chest pain, call your Primary MD immediately. Follow Cardiac Low Salt Diet and 1.8 lit/day fluid restriction.   On your next visit with her primary care physician please Get Medicines reviewed and adjusted.  Please request your Prim.MD to go over all Hospital Tests and Procedure/Radiological results at the follow up, please get all Hospital records sent to your Prim MD by signing hospital release before you go home.   If you experience worsening of your admission symptoms, develop shortness of breath, life threatening emergency, suicidal or homicidal thoughts you must seek medical attention immediately by calling 911 or calling your MD immediately  if symptoms less severe.  You Must read complete instructions/literature along with all the possible adverse reactions/side effects for all the Medicines you take and that have been prescribed to you. Take any new Medicines after you have completely understood and accpet all the possible adverse reactions/side effects.   Do not drive, operating heavy machinery, perform activities at heights, swimming or participation in water  activities or provide baby sitting services if your were admitted for syncope or siezures until you have seen by Primary MD or a Neurologist and advised to do so again.  Do not drive when taking Pain medications.    Do not take more than prescribed Pain, Sleep and Anxiety Medications  Special Instructions: If you have smoked or chewed Tobacco  in the last 2 yrs please stop smoking, stop any regular Alcohol  and or any Recreational drug use.  Wear Seat belts while driving.   Please note  You were cared for by a hospitalist during your hospital stay. If you have any questions about your discharge medications or the care you received while you were in the hospital after you are discharged, you can call the unit and asked to speak with the hospitalist on call if the hospitalist that took care of you is not available. Once you are discharged, your primary care physician will handle any further medical issues. Please note that NO REFILLS for any discharge medications will be authorized once you are discharged, as it is imperative that you return to your primary care physician (or establish a relationship with a primary care physician if you do not have one) for your aftercare needs so that they can reassess your need for medications and monitor your lab values.     Increase activity slowly    Complete by:  As directed             Medication List    STOP taking these medications       clindamycin 300 MG capsule  Commonly known as:  CLEOCIN     fentaNYL 25 MCG/HR patch  Commonly known as:  DURAGESIC - dosed mcg/hr      TAKE these medications       allopurinol 300 MG tablet  Commonly known as:  ZYLOPRIM  Take 1 tablet (300 mg total) by mouth daily.     Dressing Sponges 4"X4" Pads  - 4/4/ pads, 1  bottle of Normal saline, Gauge for Neck bandage (wrap) - daily dressing change, 10 day supply, 1 refill.  -   - Kerlix neck wrap daily and prn.  OK to clean around wound with 4x4's and  saline. Do not remove the interior packing.     HYDROcodone-acetaminophen 7.5-325 mg/15 ml solution  Commonly known as:  HYCET  Take 10 mLs by mouth every 6 (six) hours as needed for severe pain (or cough).     levofloxacin 750 MG tablet  Commonly known as:  LEVAQUIN  Take 1 tablet (750 mg total) by mouth daily.     lisinopril-hydrochlorothiazide 20-25 MG per tablet  Commonly known as:  PRINZIDE,ZESTORETIC  Take 1 tablet by mouth daily.     ondansetron 8 MG tablet  Commonly known as:  ZOFRAN  Take 1 tablet (8 mg total) by mouth every 8 (eight) hours as needed for nausea.     predniSONE 20 MG tablet  Commonly known as:  DELTASONE  Take 4 tablets (80 mg total) by mouth daily. Take on days 1-5 of chemotherapy.     prochlorperazine 10 MG tablet  Commonly known as:  COMPAZINE  Take 1 tablet (10 mg total) by mouth every 6 (six) hours as needed (Nausea or vomiting).          Diet and Activity recommendation: See Discharge Instructions above   Consults obtained - ENT, ID -phone , Oncology over the phone   Major procedures and Radiology Reports - PLEASE review detailed and final reports for all details, in brief -     9/4 FNA - negative results  9/16He underwent a right tonsillectomy and lymph node bipseis - biopsy results reveal above mentioned Lymphoma.  9/19 seen in ER as incision in right neck from biopsy is draining, red and painful- Placed on Clindamycin  9/21- office visit with Dr Alvy Bimler- unable to start chemo as now has elevated Cr and LFTS (suspected to be from [place])- [place] stopped- Bone marrow biopsy done in office- allopurinol started  9/22 seen in [facility] for second opinion and advised to continue to follow with oncologist at [facility]- culture from abscess reveals Salmonella, called to come back to ER  9/24 by ENT physician Dr. Erik Obey incision and drainage right neck wound infection    Echo   - Left ventricle: The cavity size was normal. Wall thickness was normal.  Systolic function was normal. The estimated ejection fraction was in the range of 55% to 60%. Wall motion was normal; there were no regional wall motion abnormalities. Doppler parameters are consistent with abnormal left ventricular relaxation (grade 1 diastolic dysfunction).  Impressions:  Normal LV function; grade 1 diastolic dysfunction.     Dg Chest 2 View  09/10/2013   CLINICAL DATA:  Neck mass.  EXAM: CHEST  2 VIEW  COMPARISON:  None.  FINDINGS: Cardiomediastinal silhouette is within normal limits. The lungs are normally to slightly hyperinflated. No airspace consolidation, edema, pleural effusion, or pneumothorax is seen. Subcentimeter, round densities in both lung bases are compatible with nipple shadows. No acute osseous abnormality is identified.  IMPRESSION: No active cardiopulmonary disease or evidence of lung mass or gross hilar lymphadenopathy.   Electronically Signed   By: Logan Bores   On: 09/10/2013 15:07   Ct Soft Tissue Neck W Contrast  09/10/2013   CLINICAL DATA:  Right neck and facial swelling/ mass. Trouble swallowing. Concern for malignancy.  EXAM: CT NECK WITH CONTRAST  CT MAXILLOFACIAL WITH CONTRAST  TECHNIQUE: Multidetector CT  imaging of the neck was performed using the standard protocol following the bolus administration of intravenous contrast. Multidetector CT imaging of the maxillofacial structures was performed with intravenous contrast. Multiplanar CT image reconstructions were also generated.  CONTRAST:  79m OMNIPAQUE IOHEXOL 300 MG/ML  SOLN  COMPARISON:  None.  FINDINGS: The visualized portion of the brain is unremarkable. Orbits are unremarkable. There is minimal left frontal and right maxillary sinus mucosal thickening. Mastoid air cells are clear.  Nasopharynx is unremarkable. There is masslike enlargement of the right palatine tonsil which projects medially into the oropharynx and measures approximately 2.9 x 2.0 cm with inferior extension to the level of epiglottis.  There is a rim enhancing low density mass/ collection deep to the right sternocleidomastoid extending from the angle of the mandible inferiorly to the level of the thyroid cartilage and measuring 3.8 x 3.7 x 5.8 cm (transverse by AP by craniocaudal), concerning for large, necrotic lymph node with extracapsular extension. There is soft tissue thickening/ inflammatory change surrounding. Posterior to this are bulky right level II lymph nodes measuring up to 3.7 x 2.4 cm. Multiple enlarged right level III, IV, and V lymph nodes are also present measuring up to 3.1 x 2.0 cm. Necrotic right level V nodal mass measures 3.1 x 2.1 cm (series 3, image 83). No enlarged left cervical lymph nodes are identified.  Right submandibular gland is displaced anteriorly by the necrotic mass/ fluid collection in the right neck. The left submandibular gland and parotid glands are unremarkable. Small thyroid nodules measure up to 1 cm in size. Right internal jugular vein is either severely compressed or occluded in the upper and mid neck but appears patent in the lower neck. Mild lower cervical spondylosis is noted.  IMPRESSION: 1. Masslike enlargement of the right palatine tonsil with extensive, bulky, necrotic lymphadenopathy throughout the right neck, including a 5.8 cm necrotic right-sided nodal mass with extracapsular extension. 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 2. Severely compressed versus occluded internal jugular vein in the mid and upper right neck.  These results were called by telephone at the time of interpretation on 09/10/2013 at 9:22 pm to nurse RGillis Ends who verbally acknowledged these results.   Electronically Signed   By: ALogan Bores  On: 09/10/2013 21:25   Nm Pet Image Initial (pi) Skull Base To Thigh  09/26/2013   CLINICAL DATA:  Initial treatment strategy for head and neck cancer.  EXAM: NUCLEAR MEDICINE PET SKULL BASE TO THIGH  TECHNIQUE: 9.9 mCi F-18 FDG was  injected intravenously. Full-ring PET imaging was performed from the skull base to thigh after the radiotracer. CT data was obtained and used for attenuation correction and anatomic localization.  FASTING BLOOD GLUCOSE:  Value: 127 mg/dl  COMPARISON:  09/10/2013  FINDINGS: NECK  Extensive hyper metabolic right cervical adenopathy identified. Index right level 2 lymph node measures 2.5 x 2.9 cm and has an SUV max equal to 8.2. Large, necrotic right cervical level 3 lymph node measures 4.2 x 4.6 cm and has an SUV max equal to 7.5. Low level 5 lymph node Measures 1.2 cm and has an SUV max equal to 6.7. No hypermetabolic left-sided cervical adenopathy. Asymmetric increased uptake within the enlarged right tonsil region has an SUV max equal to 6.5.  CHEST  Single right paratracheal lymph node measures 8 mm and has an SUV max equal to 3.8. No additional enlarged or hypermetabolic mediastinal or hilar lymph nodes identified. No hypermetabolic axillary  adenopathy. Left lower lobe pulmonary nodule is too small to characterize measuring 4 mm, image 33/series 6. Atelectasis noted within both posterior lung bases.  ABDOMEN/PELVIS  No abnormal hypermetabolic activity within the liver, pancreas, adrenal glands, or spleen. No hypermetabolic lymph nodes in the abdomen or pelvis.  SKELETON  No focal hypermetabolic activity to suggest skeletal metastasis.  IMPRESSION: 1. Extensive enlarged, necrotic hypermetabolic cervical adenopathy is identified. 2. Equivocal FDG uptake is identified associated with small right paratracheal lymph node. 3. Asymmetric increased uptake localizes to the enlarged right tonsillar region. 4. No evidence for malignant range FDG uptake within the abdomen or pelvis or skeleton.   Electronically Signed   By: Kerby Moors M.D.   On: 09/26/2013 10:10   Ct Maxillofacial W/cm  09/10/2013   CLINICAL DATA:  Right neck and facial swelling/ mass. Trouble swallowing. Concern for malignancy.  EXAM: CT NECK WITH  CONTRAST  CT MAXILLOFACIAL WITH CONTRAST  TECHNIQUE: Multidetector CT imaging of the neck was performed using the standard protocol following the bolus administration of intravenous contrast. Multidetector CT imaging of the maxillofacial structures was performed with intravenous contrast. Multiplanar CT image reconstructions were also generated.  CONTRAST:  33m OMNIPAQUE IOHEXOL 300 MG/ML  SOLN  COMPARISON:  None.  FINDINGS: The visualized portion of the brain is unremarkable. Orbits are unremarkable. There is minimal left frontal and right maxillary sinus mucosal thickening. Mastoid air cells are clear.  Nasopharynx is unremarkable. There is masslike enlargement of the right palatine tonsil which projects medially into the oropharynx and measures approximately 2.9 x 2.0 cm with inferior extension to the level of epiglottis. There is a rim enhancing low density mass/ collection deep to the right sternocleidomastoid extending from the angle of the mandible inferiorly to the level of the thyroid cartilage and measuring 3.8 x 3.7 x 5.8 cm (transverse by AP by craniocaudal), concerning for large, necrotic lymph node with extracapsular extension. There is soft tissue thickening/ inflammatory change surrounding. Posterior to this are bulky right level II lymph nodes measuring up to 3.7 x 2.4 cm. Multiple enlarged right level III, IV, and V lymph nodes are also present measuring up to 3.1 x 2.0 cm. Necrotic right level V nodal mass measures 3.1 x 2.1 cm (series 3, image 83). No enlarged left cervical lymph nodes are identified.  Right submandibular gland is displaced anteriorly by the necrotic mass/ fluid collection in the right neck. The left submandibular gland and parotid glands are unremarkable. Small thyroid nodules measure up to 1 cm in size. Right internal jugular vein is either severely compressed or occluded in the upper and mid neck but appears patent in the lower neck. Mild lower cervical spondylosis is noted.   IMPRESSION: 1. Masslike enlargement of the right palatine tonsil with extensive, bulky, necrotic lymphadenopathy throughout the right neck, including a 5.8 cm necrotic right-sided nodal mass with extracapsular extension. 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 2. Severely compressed versus occluded internal jugular vein in the mid and upper right neck.  These results were called by telephone at the time of interpretation on 09/10/2013 at 9:22 pm to nurse RGillis Ends who verbally acknowledged these results.   Electronically Signed   By: ALogan Bores  On: 09/10/2013 21:25    Micro Results      Recent Results (from the past 240 hour(s))  WOUND CULTURE     Status: None   Collection Time    09/24/13  1:51 PM  Result Value Ref Range Status   Specimen Description WOUND NECK   Final   Special Requests Normal   Final   Gram Stain     Final   Value: RARE WBC PRESENT, PREDOMINANTLY PMN     RARE SQUAMOUS EPITHELIAL CELLS PRESENT     NO ORGANISMS SEEN     Performed at [facility]   Culture     Final   Value: MODERATE SALMONELLA SPECIES     Note: CRITICAL RESULT CALLED TO, READ BACK BY AND VERIFIED WITH: LYNN MILLER 9/22 _0  BY REAMM FAXED TO [facility] ATT CONNIE WEANT 09/27/13 AT 515 PM BY PEAKY Referred to [facility] in [place], [place] for [place].     Performed at [facility]   Report Status PENDING   Incomplete   Organism ID, Bacteria SALMONELLA SPECIES   Final  TECHNOLOGIST REVIEW     Status: None   Collection Time    09/26/13  9:51 AM      Result Value Ref Range Status   Technologist Review Variant lymphs present   Final  CULTURE, BLOOD (ROUTINE X 2)     Status: None   Collection Time    09/27/13 11:43 PM      Result Value Ref Range Status   Specimen Description BLOOD RIGHT HAND   Final   Special Requests BOTTLES DRAWN AEROBIC ONLY 5CC   Final   Culture  Setup Time     Final   Value:  09/28/2013 08:37     Performed at [facility]   Culture     Final   Value:        BLOOD CULTURE RECEIVED NO GROWTH TO DATE CULTURE WILL BE HELD FOR 5 DAYS BEFORE ISSUING A FINAL NEGATIVE REPORT     Performed at [facility]   Report Status PENDING   Incomplete  CULTURE, BLOOD (ROUTINE X 2)     Status: None   Collection Time    09/27/13 11:46 PM      Result Value Ref Range Status   Specimen Description BLOOD LEFT ARM   Final   Special Requests BOTTLES DRAWN AEROBIC AND ANAEROBIC 5CC   Final   Culture  Setup Time     Final   Value: 09/28/2013 08:37     Performed at [facility]   Culture     Final   Value:        BLOOD CULTURE RECEIVED NO GROWTH TO DATE CULTURE WILL BE HELD FOR 5 DAYS BEFORE ISSUING A FINAL NEGATIVE REPORT     Performed at [facility]   Report Status PENDING   Incomplete       Today   Subjective:   Norman [name] today has no headache,no chest abdominal pain,no new weakness tingling or numbness, feels much better wants to go home today.    Objective:   Blood pressure 159/80, pulse 91, temperature 98.2 F (36.8 C), temperature source Oral, resp. rate 16, SpO2 96.00%.   Intake/Output Summary (Last 24 hours) at 10/03/13 1000 Last data filed at 10/03/13 0502  Gross per 24 hour  Intake   1120 ml  Output      0 ml  Net   1120 ml    Exam Awake Alert, Oriented x 3, No new F.N deficits, Normal affect [place].AT,PERRAL Supple Neck,No JVD, No cervical lymphadenopathy appriciated. Right neck wound appears improved under bandage, bandage changed by ENT physician bedside today. Symmetrical  Chest wall movement, Good air movement bilaterally, CTAB RRR,No Gallops,Rubs or new Murmurs, No Parasternal Heave +ve B.Sounds, Abd Soft, Non tender, No organomegaly appriciated, No rebound -guarding or rigidity. No Cyanosis, Clubbing or edema, No new Rash or bruise  Data Review   CBC w Diff: Lab Results  Component Value Date   WBC 9.7  09/30/2013   WBC 13.2* 09/26/2013   WBC 9.5 09/13/2013   HGB 12.0* 09/30/2013   HGB 14.6 09/26/2013   HGB 14.9 09/13/2013   HCT 36.0* 09/30/2013   HCT 42.8 09/26/2013   HCT 44.5 09/13/2013   PLT 346 09/30/2013   PLT 398 09/26/2013   LYMPHOPCT 17 09/27/2013   LYMPHOPCT 18.8 09/26/2013   MONOPCT 10 09/27/2013   MONOPCT 9.8 09/26/2013   EOSPCT 1 09/27/2013   EOSPCT 0.9 09/26/2013   BASOPCT 1 09/27/2013   BASOPCT 0.4 09/26/2013    CMP: Lab Results  Component Value Date   NA 141 09/30/2013   NA 139 09/26/2013   K 4.1 09/30/2013   K 3.7 09/26/2013   CL 102 09/30/2013   CO2 29 09/30/2013   CO2 26 09/26/2013   BUN 17 09/30/2013   BUN 36.8* 09/26/2013   CREATININE 1.00 09/30/2013   CREATININE 1.4* 09/26/2013   PROT 7.1 09/30/2013   PROT 9.0* 09/26/2013   ALBUMIN 2.6* 09/30/2013   ALBUMIN 2.9* 09/26/2013   BILITOT 0.3 09/30/2013   BILITOT 0.64 09/26/2013   ALKPHOS 105 09/30/2013   ALKPHOS 145 09/26/2013   AST 40* 09/30/2013   AST 103* 09/26/2013   ALT 109* 09/30/2013   ALT 205* 09/26/2013  .   Total Time in preparing paper work, data evaluation and todays exam - 35 minutes  Thurnell Lose M.D on 10/03/2013 at 10:00 AM  [facility] Office  [phone]   **Disclaimer: This note may have been dictated with voice recognition software. Similar sounding words can inadvertently be transcribed and this note may contain transcription errors which may not have been corrected upon publication of note.**

## 2013-10-03 NOTE — Progress Notes (Signed)
10/03/2013 9:48 AM  Mahan, Sloan [id number]  Hospital Day 7    Temp:  [97.5 F (36.4 C)-98.2 F (36.8 C)] 98.2 F (36.8 C) (09/28 0829) Pulse Rate:  [63-91] 91 (09/28 0829) Resp:  [16-18] 16 (09/28 0829) BP: (133-159)/(80-86) 159/80 mmHg (09/28 0829) SpO2:  [95 %-96 %] 96 % (09/28 0829),     Intake/Output Summary (Last 24 hours) at 10/03/13 0948 Last data filed at 10/03/13 0502  Gross per 24 hour  Intake   1120 ml  Output      0 ml  Net   1120 ml    No results found for this or any previous visit (from the past 24 hour(s)).  SUBJECTIVE:  Less pain.  Taking decent diet now.  OBJECTIVE:  Much less swelling.  No purulence today.  Repacked wound.  Still a mod lg cavity ant to SCM, no residual cavity posteriorly  IMPRESSION:  Satisfactory improvement.  PLAN:  Continue abx x 10 more days.  If you feel he is able to go home, then I will repack his wound on Wednesday. No reason for steroids from my standpoint.  Recheck Dr. Alvy Bimler when she feels he may be able to begin treatment.   [name]

## 2013-10-03 NOTE — Telephone Encounter (Signed)
Per staff message and POF I have scheduled appts. I have called and gave the daughter the appts. JMW

## 2013-10-03 NOTE — Care Management Note (Signed)
CARE MANAGEMENT NOTE 10/03/2013  Patient:  JEFRY, LESINSKI   Account Number:  [id number]  Date Initiated:  10/03/2013  Documentation initiated by:  Ricki Miller  Subjective/Objective Assessment:   63 yr old male admitted with right neck abscess.     Action/Plan:   Case manager spoke with patient concerning home health needs.Choice offered. Referral called to [place], [place]. patient states he has family to assist at discharge.   Anticipated DC Date:  10/03/2013   Anticipated DC Plan:  [place]  CM consult      [facility] Choice  HOME HEALTH   Choice offered to / List presented to:  C-1 Patient   DME arranged  NA        [place] arranged  HH-1 RN      [place].   Status of service:  Completed, signed off Medicare Important Message given?   (If response is "NO", the following Medicare IM given date fields will be blank) Date Medicare IM given:   Medicare IM given by:   Date Additional Medicare IM given:   Additional Medicare IM given by:    Discharge Disposition:  [place]  Per UR Regulation:  Reviewed for med. necessity/level of care/duration of stay

## 2013-10-04 LAB — CULTURE, BLOOD (ROUTINE X 2)
CULTURE: NO GROWTH
Culture: NO GROWTH

## 2013-10-05 NOTE — ED Provider Notes (Signed)
Medical screening examination/treatment/procedure(s) were performed by non-physician practitioner and as supervising physician I was immediately available for consultation/collaboration.   EKG Interpretation None       Babette Relic, MD 10/05/13 1314

## 2013-10-06 ENCOUNTER — Other Ambulatory Visit: Payer: BC Managed Care – PPO

## 2013-10-06 ENCOUNTER — Encounter: Payer: Self-pay | Admitting: *Deleted

## 2013-10-06 ENCOUNTER — Other Ambulatory Visit (HOSPITAL_BASED_OUTPATIENT_CLINIC_OR_DEPARTMENT_OTHER): Payer: BC Managed Care – PPO

## 2013-10-06 ENCOUNTER — Ambulatory Visit (HOSPITAL_BASED_OUTPATIENT_CLINIC_OR_DEPARTMENT_OTHER): Payer: BC Managed Care – PPO | Admitting: Hematology and Oncology

## 2013-10-06 ENCOUNTER — Encounter: Payer: Self-pay | Admitting: Hematology and Oncology

## 2013-10-06 VITALS — BP 138/69 | HR 97 | Temp 98.4°F | Resp 18 | Ht 61.0 in | Wt 179.6 lb

## 2013-10-06 DIAGNOSIS — T148XXA Other injury of unspecified body region, initial encounter: Secondary | ICD-10-CM

## 2013-10-06 DIAGNOSIS — R070 Pain in throat: Secondary | ICD-10-CM

## 2013-10-06 DIAGNOSIS — C770 Secondary and unspecified malignant neoplasm of lymph nodes of head, face and neck: Secondary | ICD-10-CM

## 2013-10-06 DIAGNOSIS — N179 Acute kidney failure, unspecified: Secondary | ICD-10-CM

## 2013-10-06 DIAGNOSIS — C8590 Non-Hodgkin lymphoma, unspecified, unspecified site: Secondary | ICD-10-CM

## 2013-10-06 DIAGNOSIS — R748 Abnormal levels of other serum enzymes: Secondary | ICD-10-CM

## 2013-10-06 DIAGNOSIS — C8331 Diffuse large B-cell lymphoma, lymph nodes of head, face, and neck: Secondary | ICD-10-CM

## 2013-10-06 DIAGNOSIS — E860 Dehydration: Secondary | ICD-10-CM

## 2013-10-06 LAB — CBC WITH DIFFERENTIAL/PLATELET
BASO%: 0.9 % (ref 0.0–2.0)
Basophils Absolute: 0.1 10*3/uL (ref 0.0–0.1)
EOS%: 0.3 % (ref 0.0–7.0)
Eosinophils Absolute: 0 10*3/uL (ref 0.0–0.5)
HCT: 45.5 % (ref 38.4–49.9)
HGB: 15 g/dL (ref 13.0–17.1)
LYMPH%: 15.5 % (ref 14.0–49.0)
MCH: 28.9 pg (ref 27.2–33.4)
MCHC: 32.9 g/dL (ref 32.0–36.0)
MCV: 87.9 fL (ref 79.3–98.0)
MONO#: 1.6 10*3/uL — ABNORMAL HIGH (ref 0.1–0.9)
MONO%: 10.5 % (ref 0.0–14.0)
NEUT%: 72.8 % (ref 39.0–75.0)
NEUTROS ABS: 11.2 10*3/uL — AB (ref 1.5–6.5)
Platelets: 334 10*3/uL (ref 140–400)
RBC: 5.17 10*6/uL (ref 4.20–5.82)
RDW: 14 % (ref 11.0–14.6)
WBC: 15.4 10*3/uL — ABNORMAL HIGH (ref 4.0–10.3)
lymph#: 2.4 10*3/uL (ref 0.9–3.3)

## 2013-10-06 LAB — COMPREHENSIVE METABOLIC PANEL (CC13)
ALBUMIN: 3 g/dL — AB (ref 3.5–5.0)
ALK PHOS: 105 U/L (ref 40–150)
ALT: 105 U/L — AB (ref 0–55)
AST: 28 U/L (ref 5–34)
Anion Gap: 8 mEq/L (ref 3–11)
BILIRUBIN TOTAL: 0.85 mg/dL (ref 0.20–1.20)
BUN: 24.8 mg/dL (ref 7.0–26.0)
CO2: 26 mEq/L (ref 22–29)
Calcium: 9.7 mg/dL (ref 8.4–10.4)
Chloride: 102 mEq/L (ref 98–109)
Creatinine: 1.4 mg/dL — ABNORMAL HIGH (ref 0.7–1.3)
GLUCOSE: 151 mg/dL — AB (ref 70–140)
POTASSIUM: 4.2 meq/L (ref 3.5–5.1)
SODIUM: 136 meq/L (ref 136–145)
TOTAL PROTEIN: 8 g/dL (ref 6.4–8.3)

## 2013-10-06 NOTE — Assessment & Plan Note (Signed)
This is due to the necrotic wound, not necessarily related to active infection. I recommend you continue someone dressing. I will monitor this very carefully.

## 2013-10-06 NOTE — Assessment & Plan Note (Signed)
The cause is unknown. I will reduce the first cycle of chemotherapy and we'll monitor this carefully.

## 2013-10-06 NOTE — Assessment & Plan Note (Signed)
I reinforced the importance of taking fentanyl patch and liquid morphine around the clock to control his pain.

## 2013-10-06 NOTE — Assessment & Plan Note (Signed)
He has progressive disease, currently at stage II based on PET scan and negative bone marrow biopsy. I reinforced the importance of compliance and followup. Until his cancer is treated, the patient will not get better. We discussed the role of chemotherapy. The intent is for cure.  We discussed some of the risks, benefits and side-effects of Rituximab,Cytoxan, Adriamycin,Vincristine and Solumedrol/Prednisone with intrathecal methotrexate.  Some of the short term side-effects included, though not limited to, risk of fatigue, weight loss, tumor lysis syndrome, risk of allergic reactions, pancytopenia, life-threatening infections, need for transfusions of blood products, nausea, vomiting, change in bowel habits, hair loss, risk of congestive heart failure, admission to hospital for various reasons, and risks of death.   Long term side-effects are also discussed including permanent damage to nerve function, chronic fatigue, and rare secondary malignancy including bone marrow disorders.   The patient is aware that the response rates discussed earlier is not guaranteed.    After a long discussion, patient made an informed decision to proceed with the prescribed plan of care and went ahead to sign the consent form today.   Patient education material was dispensed Because he has persistent elevated liver enzymes, I would reduce the dose of chemotherapy for the first cycle.

## 2013-10-06 NOTE — Progress Notes (Signed)
[place] OFFICE PROGRESS NOTE  Patient Care Team: No Pcp Per Patient as PCP - General (General Practice)  SUMMARY OF ONCOLOGIC HISTORY: His initial presentation was a lump in the right side of his neck around July of this year. He was hospitalized. He was referred to ENT and was prescribed antibiotics but the swelling was worse. 09/09/2013, CT scan show significant oropharyngeal mass and lymphadenopathy. He underwent fine-needle aspirate and biopsy which was inconclusive. His family members noted dysphagia since March of this year. He has lost 5 pounds in one week. He have anorexia. He complained of severe for pain, rating his pain is 10 out of 10 pain. The pain medication that was prescribed to him was not helping much. He denies any constipation. He had mild nausea. 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 Between September to October 2015, he had recurrent admissions the hospital due to persistent drainage from his right neck wound.  INTERVAL HISTORY: Please see below for problem oriented charting. He has poor appetite, regular drenching night sweats and complained of dizziness. He denies further fevers or chills. He has discontinue prednisone therapy recently as well as allopurinol. He denies dysphagia. His sore throat is well controlled with current pain medicine.  REVIEW OF SYSTEMS:   Constitutional: Denies fevers, chills Eyes: Denies blurriness of vision Respiratory: Denies cough, dyspnea or wheezes Cardiovascular: Denies palpitation, chest discomfort or lower extremity swelling Gastrointestinal:  Denies nausea, heartburn or change in bowel habits Skin: Denies abnormal skin rashes Lymphatics: Denies new lymphadenopathy or easy bruising Neurological:Denies numbness, tingling or new weaknesses Behavioral/Psych: Mood is stable, no new changes  All  other systems were reviewed with the patient and are negative.  I have reviewed the past medical history, past surgical history, social history and family history with the patient and they are unchanged from previous note.  ALLERGIES:  is allergic to penicillins.  MEDICATIONS:  Current Outpatient Prescriptions  Medication Sig Dispense Refill  . Gauze Pads & Dressings (DRESSING SPONGES) 4"X4" PADS 4/4/ pads, 1 bottle of Normal saline, Gauge for Neck bandage (wrap) - daily dressing change, 10 day supply, 1 refill.  Kerlix neck wrap daily and prn.  OK to clean around wound with 4x4's and saline. Do not remove the interior packing.  10 each  1  . HYDROcodone-acetaminophen (HYCET) 7.5-325 mg/15 ml solution Take 10 mLs by mouth every 6 (six) hours as needed for severe pain (or cough).  120 mL  0  . levofloxacin (LEVAQUIN) 750 MG tablet Take 1 tablet (750 mg total) by mouth daily.  7 tablet  0  . lisinopril-hydrochlorothiazide (PRINZIDE,ZESTORETIC) 20-25 MG per tablet Take 1 tablet by mouth daily.  30 tablet  3  . ondansetron (ZOFRAN) 8 MG tablet Take 1 tablet (8 mg total) by mouth every 8 (eight) hours as needed for nausea.  30 tablet  1  . prochlorperazine (COMPAZINE) 10 MG tablet Take 1 tablet (10 mg total) by mouth every 6 (six) hours as needed (Nausea or vomiting).  30 tablet  6  . allopurinol (ZYLOPRIM) 300 MG tablet Take 1 tablet (300 mg total) by mouth daily.  30 tablet  3  . predniSONE (DELTASONE) 20 MG tablet Take 4 tablets (80 mg total) by mouth daily. Take on days 1-5 of chemotherapy.  60 tablet  6   No current facility-administered medications for this visit.    PHYSICAL  EXAMINATION: ECOG PERFORMANCE STATUS: 2 - Symptomatic, <50% confined to bed  Filed Vitals:   10/06/13 1025  BP: 138/69  Pulse: 97  Temp: 98.4 F (36.9 C)  Resp: 18   Filed Weights   10/06/13 1025  Weight: 179 lb 9.6 oz (81.466 kg)    GENERAL:alert, no distress and comfortable. He is morbidly obese SKIN:  Noted persistent drainage from the right neck wound.  EYES: normal, Conjunctiva are pink and non-injected, sclera clear OROPHARYNX:no exudate, no erythema and lips, buccal mucosa, and tongue normal . He has persistent tonsillar mass, slightly reduced in size compared to his last visit NECK: His right neck is swollen LYMPH: Significant lymphadenopathy on the right side of the neck, none elsewhere  LUNGS: clear to auscultation and percussion with normal breathing effort HEART: regular rate & rhythm and no murmurs and no lower extremity edema ABDOMEN:abdomen soft, non-tender and normal bowel sounds Musculoskeletal:no cyanosis of digits and no clubbing  NEURO: alert & oriented x 3 with fluent speech, no focal motor/sensory deficits  LABORATORY DATA:  I have reviewed the data as listed    Component Value Date/Time   NA 136 10/06/2013 1007   NA 141 09/30/2013 0630   K 4.2 10/06/2013 1007   K 4.1 09/30/2013 0630   CL 102 09/30/2013 0630   CO2 26 10/06/2013 1007   CO2 29 09/30/2013 0630   GLUCOSE 151* 10/06/2013 1007   GLUCOSE 71 09/30/2013 0630   BUN 24.8 10/06/2013 1007   BUN 17 09/30/2013 0630   CREATININE 1.4* 10/06/2013 1007   CREATININE 1.00 09/30/2013 0630   CALCIUM 9.7 10/06/2013 1007   CALCIUM 8.8 09/30/2013 0630   PROT 8.0 10/06/2013 1007   PROT 7.1 09/30/2013 0630   ALBUMIN 3.0* 10/06/2013 1007   ALBUMIN 2.6* 09/30/2013 0630   AST 28 10/06/2013 1007   AST 40* 09/30/2013 0630   ALT 105* 10/06/2013 1007   ALT 109* 09/30/2013 0630   ALKPHOS 105 10/06/2013 1007   ALKPHOS 105 09/30/2013 0630   BILITOT 0.85 10/06/2013 1007   BILITOT 0.3 09/30/2013 0630   GFRNONAA 78* 09/30/2013 0630   GFRAA >90 09/30/2013 0630    No results found for this basename: SPEP,  UPEP,   kappa and lambda light chains    Lab Results  Component Value Date   WBC 15.4* 10/06/2013   NEUTROABS 11.2* 10/06/2013   HGB 15.0 10/06/2013   HCT 45.5 10/06/2013   MCV 87.9 10/06/2013   PLT 334 10/06/2013      Chemistry       Component Value Date/Time   NA 136 10/06/2013 1007   NA 141 09/30/2013 0630   K 4.2 10/06/2013 1007   K 4.1 09/30/2013 0630   CL 102 09/30/2013 0630   CO2 26 10/06/2013 1007   CO2 29 09/30/2013 0630   BUN 24.8 10/06/2013 1007   BUN 17 09/30/2013 0630   CREATININE 1.4* 10/06/2013 1007   CREATININE 1.00 09/30/2013 0630      Component Value Date/Time   CALCIUM 9.7 10/06/2013 1007   CALCIUM 8.8 09/30/2013 0630   ALKPHOS 105 10/06/2013 1007   ALKPHOS 105 09/30/2013 0630   AST 28 10/06/2013 1007   AST 40* 09/30/2013 0630   ALT 105* 10/06/2013 1007   ALT 109* 09/30/2013 0630   BILITOT 0.85 10/06/2013 1007   BILITOT 0.3 09/30/2013 0630      ASSESSMENT & PLAN:  Lymphoma of lymph nodes He has progressive disease, currently at stage II based on PET scan  and negative bone marrow biopsy. I reinforced the importance of compliance and followup. Until his cancer is treated, the patient will not get better. We discussed the role of chemotherapy. The intent is for cure.  We discussed some of the risks, benefits and side-effects of Rituximab,Cytoxan, Adriamycin,Vincristine and Solumedrol/Prednisone with intrathecal methotrexate.  Some of the short term side-effects included, though not limited to, risk of fatigue, weight loss, tumor lysis syndrome, risk of allergic reactions, pancytopenia, life-threatening infections, need for transfusions of blood products, nausea, vomiting, change in bowel habits, hair loss, risk of congestive heart failure, admission to hospital for various reasons, and risks of death.   Long term side-effects are also discussed including permanent damage to nerve function, chronic fatigue, and rare secondary malignancy including bone marrow disorders.   The patient is aware that the response rates discussed earlier is not guaranteed.    After a long discussion, patient made an informed decision to proceed with the prescribed plan of care and went ahead to sign the consent form today.    Patient education material was dispensed Because he has persistent elevated liver enzymes, I would reduce the dose of chemotherapy for the first cycle.     Throat pain I reinforced the importance of taking fentanyl patch and liquid morphine around the clock to control his pain.  Elevated liver enzymes The cause is unknown. I will reduce the first cycle of chemotherapy and we'll monitor this carefully.  Wound discharge This is due to the necrotic wound, not necessarily related to active infection. I recommend you continue someone dressing. I will monitor this very carefully.  Acute renal failure This is related to dehydration. I will adjust and reduce the dose of chemotherapy. I recommend holding of his blood pressure medication for now.    No orders of the defined types were placed in this encounter.   All questions were answered. The patient knows to call the clinic with any problems, questions or concerns. No barriers to learning was detected. I spent 40 minutes counseling the patient face to face. The total time spent in the appointment was 60 minutes and more than 50% was on counseling and review of test results     [facility], [place], MD 10/06/2013 7:51 PM

## 2013-10-06 NOTE — Assessment & Plan Note (Addendum)
This is related to dehydration. I will adjust and reduce the dose of chemotherapy. I recommend holding of his blood pressure medication for now.

## 2013-10-07 ENCOUNTER — Other Ambulatory Visit: Payer: Self-pay | Admitting: Hematology and Oncology

## 2013-10-07 ENCOUNTER — Telehealth: Payer: Self-pay | Admitting: *Deleted

## 2013-10-07 ENCOUNTER — Ambulatory Visit (HOSPITAL_BASED_OUTPATIENT_CLINIC_OR_DEPARTMENT_OTHER): Payer: BC Managed Care – PPO

## 2013-10-07 VITALS — BP 119/60 | HR 91 | Temp 98.2°F | Resp 18

## 2013-10-07 DIAGNOSIS — Z5111 Encounter for antineoplastic chemotherapy: Secondary | ICD-10-CM

## 2013-10-07 DIAGNOSIS — Z5112 Encounter for antineoplastic immunotherapy: Secondary | ICD-10-CM

## 2013-10-07 DIAGNOSIS — C8590 Non-Hodgkin lymphoma, unspecified, unspecified site: Secondary | ICD-10-CM

## 2013-10-07 DIAGNOSIS — C8331 Diffuse large B-cell lymphoma, lymph nodes of head, face, and neck: Secondary | ICD-10-CM

## 2013-10-07 MED ORDER — ACETAMINOPHEN 325 MG PO TABS
650.0000 mg | ORAL_TABLET | Freq: Once | ORAL | Status: AC
  Administered 2013-10-07: 650 mg via ORAL

## 2013-10-07 MED ORDER — ONDANSETRON 16 MG/50ML IVPB (CHCC)
INTRAVENOUS | Status: AC
  Filled 2013-10-07: qty 16

## 2013-10-07 MED ORDER — ACETAMINOPHEN 325 MG PO TABS
ORAL_TABLET | ORAL | Status: AC
Start: 2013-10-07 — End: 2013-10-07
  Filled 2013-10-07: qty 2

## 2013-10-07 MED ORDER — SODIUM CHLORIDE 0.9 % IV SOLN
375.0000 mg/m2 | Freq: Once | INTRAVENOUS | Status: AC
  Administered 2013-10-07: 700 mg via INTRAVENOUS
  Filled 2013-10-07: qty 70

## 2013-10-07 MED ORDER — CYCLOPHOSPHAMIDE CHEMO INJECTION 1 GM
562.5000 mg/m2 | Freq: Once | INTRAMUSCULAR | Status: AC
  Administered 2013-10-07: 1080 mg via INTRAVENOUS
  Filled 2013-10-07: qty 54

## 2013-10-07 MED ORDER — DEXAMETHASONE SODIUM PHOSPHATE 20 MG/5ML IJ SOLN
INTRAMUSCULAR | Status: AC
  Filled 2013-10-07: qty 5

## 2013-10-07 MED ORDER — SODIUM CHLORIDE 0.9 % IV SOLN
Freq: Once | INTRAVENOUS | Status: AC
  Administered 2013-10-07: 08:00:00 via INTRAVENOUS

## 2013-10-07 MED ORDER — ONDANSETRON 16 MG/50ML IVPB (CHCC)
16.0000 mg | Freq: Once | INTRAVENOUS | Status: AC
  Administered 2013-10-07: 16 mg via INTRAVENOUS

## 2013-10-07 MED ORDER — VINCRISTINE SULFATE CHEMO INJECTION 1 MG/ML
1.0000 mg | Freq: Once | INTRAVENOUS | Status: AC
  Administered 2013-10-07: 1 mg via INTRAVENOUS
  Filled 2013-10-07: qty 1

## 2013-10-07 MED ORDER — DIPHENHYDRAMINE HCL 25 MG PO CAPS
50.0000 mg | ORAL_CAPSULE | Freq: Once | ORAL | Status: AC
  Administered 2013-10-07: 50 mg via ORAL

## 2013-10-07 MED ORDER — DEXAMETHASONE SODIUM PHOSPHATE 20 MG/5ML IJ SOLN
20.0000 mg | Freq: Once | INTRAMUSCULAR | Status: AC
  Administered 2013-10-07: 20 mg via INTRAVENOUS

## 2013-10-07 MED ORDER — DIPHENHYDRAMINE HCL 25 MG PO CAPS
ORAL_CAPSULE | ORAL | Status: AC
  Filled 2013-10-07: qty 2

## 2013-10-07 MED ORDER — DOXORUBICIN HCL CHEMO IV INJECTION 2 MG/ML
25.0000 mg/m2 | Freq: Once | INTRAVENOUS | Status: AC
  Administered 2013-10-07: 48 mg via INTRAVENOUS
  Filled 2013-10-07: qty 24

## 2013-10-07 NOTE — Telephone Encounter (Signed)
Message copied by Cathlean Cower on Fri Oct 07, 2013  8:59 AM ------      Message from: [facility], NI      Created: Thu Oct 06, 2013  7:50 PM      Regarding: Stop his BP meds       Please let him and daughter know he has to stop his zestoretic pill ------

## 2013-10-07 NOTE — Patient Instructions (Addendum)
[place] Discharge Instructions for Patients Receiving Chemotherapy  Today you received the following chemotherapy agents Adriamycin, Vincristine, Cytoxan and Rituxan.  STOP YOUR BLOOD PRESSURE MEDICINE UNTIL FURTHER INSTRUCTIONS BY DR [facility]. THIS IS DUE TO YOUR KIDNEY FUNCTION LABS.   To help prevent nausea and vomiting after your treatment, we encourage you to take your nausea medication.   If you develop nausea and vomiting that is not controlled by your nausea medication, call the clinic.   BELOW ARE SYMPTOMS THAT SHOULD BE REPORTED IMMEDIATELY:  *FEVER GREATER THAN 100.5 F  *CHILLS WITH OR WITHOUT FEVER  NAUSEA AND VOMITING THAT IS NOT CONTROLLED WITH YOUR NAUSEA MEDICATION  *UNUSUAL SHORTNESS OF BREATH  *UNUSUAL BRUISING OR BLEEDING  TENDERNESS IN MOUTH AND THROAT WITH OR WITHOUT PRESENCE OF ULCERS  *URINARY PROBLEMS  *BOWEL PROBLEMS  UNUSUAL RASH Items with * indicate a potential emergency and should be followed up as soon as possible.  Feel free to call the clinic you have any questions or concerns. The clinic phone number is [phone].

## 2013-10-07 NOTE — Telephone Encounter (Signed)
Asked Infusion RN,  Ailene Ravel to instruct family/pt to stop his Zestoretic Pill per Dr. Alvy Bimler.  Pt currently in Infusion room for chemotherapy.  She agreed to give them instructions.

## 2013-10-10 ENCOUNTER — Other Ambulatory Visit: Payer: Self-pay | Admitting: *Deleted

## 2013-10-10 ENCOUNTER — Ambulatory Visit (HOSPITAL_BASED_OUTPATIENT_CLINIC_OR_DEPARTMENT_OTHER): Payer: BC Managed Care – PPO | Admitting: Hematology and Oncology

## 2013-10-10 ENCOUNTER — Other Ambulatory Visit: Payer: Self-pay | Admitting: Lab

## 2013-10-10 ENCOUNTER — Other Ambulatory Visit: Payer: Self-pay | Admitting: Hematology and Oncology

## 2013-10-10 ENCOUNTER — Ambulatory Visit: Payer: BC Managed Care – PPO | Admitting: Nutrition

## 2013-10-10 ENCOUNTER — Other Ambulatory Visit (HOSPITAL_COMMUNITY)
Admission: RE | Admit: 2013-10-10 | Discharge: 2013-10-10 | Disposition: A | Discharge status: Home / Self Care | Payer: BC Managed Care – PPO | Source: Ambulatory Visit | Attending: Hematology and Oncology | Admitting: Hematology and Oncology

## 2013-10-10 ENCOUNTER — Other Ambulatory Visit: Payer: BC Managed Care – PPO | Admitting: Hematology and Oncology

## 2013-10-10 VITALS — BP 150/69 | HR 98 | Temp 98.0°F

## 2013-10-10 DIAGNOSIS — Z5111 Encounter for antineoplastic chemotherapy: Secondary | ICD-10-CM

## 2013-10-10 DIAGNOSIS — C8590 Non-Hodgkin lymphoma, unspecified, unspecified site: Secondary | ICD-10-CM

## 2013-10-10 DIAGNOSIS — Z08 Encounter for follow-up examination after completed treatment for malignant neoplasm: Secondary | ICD-10-CM

## 2013-10-10 DIAGNOSIS — C8331 Diffuse large B-cell lymphoma, lymph nodes of head, face, and neck: Secondary | ICD-10-CM

## 2013-10-10 DIAGNOSIS — Z9221 Personal history of antineoplastic chemotherapy: Secondary | ICD-10-CM | POA: Diagnosis not present

## 2013-10-10 MED ORDER — PEGFILGRASTIM INJECTION 6 MG/0.6ML
6.0000 mg | Freq: Once | SUBCUTANEOUS | Status: AC
  Administered 2013-10-10: 6 mg via SUBCUTANEOUS
  Filled 2013-10-10: qty 0.6

## 2013-10-10 MED ORDER — SODIUM CHLORIDE 0.9 % IJ SOLN
Freq: Once | INTRAMUSCULAR | Status: AC
  Administered 2013-10-10: 13:00:00 via INTRATHECAL
  Filled 2013-10-10: qty 0.48

## 2013-10-10 MED ORDER — SODIUM CHLORIDE 0.9 % IV SOLN
INTRAVENOUS | Status: AC
  Administered 2013-10-10: 11:00:00 via INTRAVENOUS

## 2013-10-10 NOTE — Progress Notes (Signed)
He feels better since his last chemotherapy. His pain has resolved. The neck swelling has improved. He has no side effects from chemotherapy recently. He presented today for diagnostic lumbar puncture for intrathecal chemotherapy administration. Interpreter was present throughout the entire procedure. Total contact time 30 minutes.  Diagnostic Lumbar Puncture and Intrathecal administration of chemotherapy Procedure Note   Informed consent was obtained and potential risks including bleeding, infection and pain were reviewed with the patient.  The patient's name, date of birth, identification, consent and allergies were verified prior to the start of procedure and time out was performed.  The skin was prepped with Betadine solution.   2 cc of 1% lidocaine was used to provide local anaesthesia.   The L3/L4 intrathecal space was chosen as the site of procedure.  5 cc of clear CSF was obtained.  Next, 5 ml/12 mg methotrexate was administered into the intrathecal space.  The procedure was tolerated well and there were no complications.  The patient was stable at the end of the procedure.  Specimens sent for cell count, protein and cytology to rule out lymphoma.

## 2013-10-10 NOTE — Patient Instructions (Addendum)
[facility] Discharge Instructions for Patients Receiving Chemotherapy  Today you received the following chemotherapy agents Methotrexate/Neulasta.  To help prevent nausea and vomiting after your treatment, we encourage you to take your nausea medication as prescribed.   If you develop nausea and vomiting that is not controlled by your nausea medication, call the clinic.   BELOW ARE SYMPTOMS THAT SHOULD BE REPORTED IMMEDIATELY:  *FEVER GREATER THAN 100.5 F  *CHILLS WITH OR WITHOUT FEVER  NAUSEA AND VOMITING THAT IS NOT CONTROLLED WITH YOUR NAUSEA MEDICATION  *UNUSUAL SHORTNESS OF BREATH  *UNUSUAL BRUISING OR BLEEDING  TENDERNESS IN MOUTH AND THROAT WITH OR WITHOUT PRESENCE OF ULCERS  *URINARY PROBLEMS  *BOWEL PROBLEMS  UNUSUAL RASH Items with * indicate a potential emergency and should be followed up as soon as possible.  Feel free to call the clinic you have any questions or concerns. The clinic phone number is [phone].   Puncin lumbar (Lumbar Puncture) La puncin lumbar es un procedimiento mediante el cual se extrae y examina una pequea cantidad del lquido que rodea el cerebro y la mdula espinal. Este lquido se llama lquido cefalorraqudeo. Este procedimiento se Optometrist para lo siguiente:   Ayudar a Insurance claims handler, como la meningitis, la encefalitis, la esclerosis mltiple y [place].  Drenar lquido y Human resources officer presin que se presenta con ciertos tipos de cefaleas.  Detectar hemorragias en las zonas del cerebro y la mdula espinal (sistema nervioso central).  Administrar medicamentos en el lquido cefalorraqudeo. INFORME A SU MDICO:  Cualquier alergia que tenga.  Todos los [employer], incluidos vitaminas, hierbas, gotas oftlmicas, cremas y medicamentos de venta libre.  Problemas previos que usted o los [employer] de su familia hayan tenido con el uso de anestsicos.  Enfermedades de [employer].  Cirugas previas.  Enfermedades. RIESGOS Y COMPLICACIONES En general, se trata de un procedimiento seguro. Sin embargo, Games developer procedimiento, pueden surgir complicaciones. Las complicaciones posibles son:   Cefalea por puncin lumbar. Es una cefalea grave que se presenta cuando hay prdida de lquido cefalorraqudeo. Una cefalea por puncin lumbar provoca malestar pero no es peligrosa. Si persiste, se puede realizar otro procedimiento para tratar la cefalea.  Hemorragias. Generalmente ocurre en personas con trastornos hemorrgicos. Se trata de enfermedades en las que la sangre no coagula normalmente.  Infeccin en el lugar de la insercin que se puede extender hasta el hueso o el lquido cefalorraqudeo.  Formacin de un tumor en la mdula espinal (poco frecuente).  Hernia cerebral o desplazamiento del cerebro a la mdula espinal (poco frecuente).  Imposibilidad para moverse (extremadamente raro). ANTES DEL PROCEDIMIENTO  Posiblemente deba realizarse anlisis de [place]. Estos anlisis pueden ayudar a Advertising account executive con la que estn funcionando los riones y Engineer, maintenance (IT). Tambin pueden determinar la eficacia con la que coagula la [place].  Si toma anticoagulantes, pregntele al mdico si debe dejar de tomarlos y cundo.  El mdico puede indicarle que se realice una tomografa computarizada (TC) del cerebro.  Pdale a alguna persona que lo lleve a su casa despus del procedimiento. PROCEDIMIENTO  Se lo colocar en una posicin que permita que los espacios entre los huesos de la columna (vrtebras) se separen todo lo posible. Esta posicin facilitar el pasaje de la aguja hasta el canal espinal.  Segn su edad y tamao, puede yacer de costado, con las rodillas cerca del mentn. O bien, puede sentarse con la cabeza sobre una almohada colocada a la altura de la  cintura.  Se limpiar la piel que recubre la parte inferior de la espalda (o regin  lumbar).  Posiblemente se adormezca la piel con medicamentos.  Probablemente le administren un medicamento para el dolor o para ayudarlo a relajarse (sedante).  Luego se inserta una pequea aguja, hasta que ingresa en el espacio que contiene el lquido cefalorraqudeo. La aguja no ingresar a la mdula espinal.  El lquido cefalorraqudeo se Field seismologist en tubos.  Se retirar la aguja y se colocar un apsito en la zona. DESPUS DEL PROCEDIMIENTO  Permanecer recostado durante 1hora o durante el tiempo que sugiera el [place] cefalorraqudeo se enviar a un laboratorio para que sea examinado. Los [employer] del examen posiblemente estn listos antes de que se vaya a su casa.  Si el mdico cree que tiene una infeccin, se Company secretary del lquido cefalorraqudeo para hacer una prueba llamada cultivo. Si se extrajo un cultivo para exmenes, los resultados generalmente estarn listos en un par [employer]. Document Released: 10/02/2004 Document Revised: 10/13/2012 [facility] Patient Information 2015 [place]. This information is not intended to replace advice given to you by your health care provider. Make sure you discuss any questions you have with your health care provider.

## 2013-10-10 NOTE — Progress Notes (Signed)
63 year old male diagnosed with B-cell Lymphoma.  He is a patient of Dr. Alvy Bimler.  PMH includes arthritis, HOH, and depression.  Medications include zofran, prednisone, compazine.  Labs include glucose of 151, Creatinine 1.4, and Alb 3.0.  Height 5 feet 1 inches. Weight 179.6 pounds October 1. Usual body weight: 193 pounds September 15. BMI: 33.95.  Spoke with patient during chemotherapy via translator.  Patient reports he does have nausea however, it is controlled with medications.  He has utilized oral nutrition supplements and prefers boost.  Patient denies other nutritional issues.  Noted patient has necrotic wound on neck, increasing nutritional needs.  Patient meets criteria for severe malnutrition in the context of chronic illness secondary to 7% weight loss over 2 weeks and depletion of body fat and muscle mass.  Nutrition diagnosis: Inadequate oral intake related to new diagnosis of cancer as evidenced by 13.4 pound weight loss.  Intervention: Educated patient on strategies for increasing calories and protein.  Recommended 6 small meals or snacks. Recommend high-calorie, oral nutrition supplements 3 times a day between meals. Provided one complementary case of Ensure Plus along with coupons. Provided Spanish education sheets on increasing calories and protein, and a list of high protein foods. Questions were answered and teach back method used. Provided prescription to the mobile [employer] for [employer].  Monitoring, evaluation, goals: Patient will tolerate increased calories and protein to minimize further weight loss.  Next visit: To be scheduled as needed.  **Disclaimer: This note was dictated with voice recognition software. Similar sounding words can inadvertently be transcribed and this note may contain transcription errors which may not have been corrected upon publication of note.**

## 2013-10-10 NOTE — Addendum Note (Signed)
Addended byAlvy Bimler, Troi Florendo on: 10/10/2013 02:10 PM   Modules accepted: Level of Service

## 2013-10-11 ENCOUNTER — Telehealth: Payer: Self-pay | Admitting: Hematology and Oncology

## 2013-10-11 ENCOUNTER — Telehealth: Payer: Self-pay | Admitting: *Deleted

## 2013-10-11 NOTE — Telephone Encounter (Signed)
lvm for pt regarding to OCT appt ...mailed pt appt sched/avs and letter °

## 2013-10-11 NOTE — Telephone Encounter (Signed)
Called [facility] [name] for chemotherapy F/U.  Spoke with daughter-n-law Judson Roch who speaks [place].  Judson Roch says he is great after chemotherapy.  He was there this morning and has left at this time.  She helped him with morning ADL's.  Patient is doing well.  Denies n/v.  Denies any new side effects or symptoms.  Bowel and bladder is functioning well.  Eating and drinking well and I instructed to drink 64 oz minimum daily or at least the day before, of and after treatment.  Denies questions at this time and encouraged to call if he reports anything to her as needed.  Reviewed how to call after hours in the case of an emergency.

## 2013-10-11 NOTE — Telephone Encounter (Signed)
Message copied by Cherylynn Ridges on Tue Oct 11, 2013  9:29 AM ------      Message from: Jaci Carrel A      Created: Fri Oct 07, 2013  1:01 PM      Regarding: chemo follow up call       First time Adriamycin, Vincristine, Cytoxan and Rituxan            Patient is spanish speaking FYI ------

## 2013-10-12 ENCOUNTER — Telehealth: Payer: Self-pay | Admitting: *Deleted

## 2013-10-12 LAB — CSF CELL COUNT WITH DIFFERENTIAL
Eosinophils, CSF: 0 % (ref 0–1)
LYMPHS CSF: 0 % — AB (ref 40–80)
Monocyte/Macrophage: 0 % — ABNORMAL LOW (ref 15–45)
RBC COUNT CSF: 10 uL — AB
Segmented Neutrophils-CSF: 0 % (ref 0–6)
WBC CSF: 2 uL (ref 0–5)

## 2013-10-12 LAB — CYTOLOGY, CSF

## 2013-10-12 NOTE — Telephone Encounter (Signed)
Daughter in law, Judson Roch, called to report pt's daughter states pt is not feeling well past few days w/ increase c/o pain.  She says he is taking morphine but it is not helping.  She asks if Dr. Alvy Bimler can prescribe something other than morphine? Judson Roch says pt is not wearing fentanyl patch and she is unsure how much of the liquid morphine he has been taking.  Asked her to go to pt's home and have him put on fentanyl patch.  It takes 12 hrs for patch to work and pt needs to leave on continuously changing every 72 hrs.  Please look at morphine and find out how much pt has been taking.  If pt is taking maximum dose and wearing fentanyl patch and his pain still not managed then Dr. Alvy Bimler can increase his dose.   We need to know how much pt is actually taking. She agreed, will go to pt's house and call nurse back.

## 2013-10-14 ENCOUNTER — Encounter: Payer: BC Managed Care – PPO | Admitting: Nutrition

## 2013-10-20 ENCOUNTER — Other Ambulatory Visit: Payer: BC Managed Care – PPO

## 2013-10-20 ENCOUNTER — Telehealth: Payer: Self-pay | Admitting: Hematology and Oncology

## 2013-10-20 ENCOUNTER — Telehealth: Payer: Self-pay | Admitting: *Deleted

## 2013-10-20 ENCOUNTER — Ambulatory Visit (HOSPITAL_BASED_OUTPATIENT_CLINIC_OR_DEPARTMENT_OTHER): Payer: BC Managed Care – PPO | Admitting: Hematology and Oncology

## 2013-10-20 ENCOUNTER — Other Ambulatory Visit (HOSPITAL_BASED_OUTPATIENT_CLINIC_OR_DEPARTMENT_OTHER): Payer: BC Managed Care – PPO

## 2013-10-20 VITALS — BP 134/69 | HR 95 | Temp 98.4°F | Resp 20 | Ht 61.0 in | Wt 186.3 lb

## 2013-10-20 DIAGNOSIS — E876 Hypokalemia: Secondary | ICD-10-CM

## 2013-10-20 DIAGNOSIS — T148XXA Other injury of unspecified body region, initial encounter: Secondary | ICD-10-CM

## 2013-10-20 DIAGNOSIS — C770 Secondary and unspecified malignant neoplasm of lymph nodes of head, face and neck: Secondary | ICD-10-CM

## 2013-10-20 DIAGNOSIS — R070 Pain in throat: Secondary | ICD-10-CM

## 2013-10-20 DIAGNOSIS — C8330 Diffuse large B-cell lymphoma, unspecified site: Secondary | ICD-10-CM

## 2013-10-20 LAB — COMPREHENSIVE METABOLIC PANEL (CC13)
ALBUMIN: 3.2 g/dL — AB (ref 3.5–5.0)
ALT: 37 U/L (ref 0–55)
AST: 19 U/L (ref 5–34)
Alkaline Phosphatase: 148 U/L (ref 40–150)
Anion Gap: 11 mEq/L (ref 3–11)
BUN: 27.2 mg/dL — AB (ref 7.0–26.0)
CALCIUM: 9.6 mg/dL (ref 8.4–10.4)
CHLORIDE: 104 meq/L (ref 98–109)
CO2: 24 mEq/L (ref 22–29)
CREATININE: 1.1 mg/dL (ref 0.7–1.3)
GLUCOSE: 111 mg/dL (ref 70–140)
POTASSIUM: 3 meq/L — AB (ref 3.5–5.1)
Sodium: 138 mEq/L (ref 136–145)
Total Bilirubin: 0.26 mg/dL (ref 0.20–1.20)
Total Protein: 7 g/dL (ref 6.4–8.3)

## 2013-10-20 LAB — CBC WITH DIFFERENTIAL/PLATELET
BASO%: 0.4 % (ref 0.0–2.0)
BASOS ABS: 0.1 10*3/uL (ref 0.0–0.1)
EOS%: 0.4 % (ref 0.0–7.0)
Eosinophils Absolute: 0.1 10*3/uL (ref 0.0–0.5)
HEMATOCRIT: 42.1 % (ref 38.4–49.9)
HEMOGLOBIN: 14 g/dL (ref 13.0–17.1)
LYMPH#: 1.9 10*3/uL (ref 0.9–3.3)
LYMPH%: 8.4 % — AB (ref 14.0–49.0)
MCH: 28.9 pg (ref 27.2–33.4)
MCHC: 33.3 g/dL (ref 32.0–36.0)
MCV: 86.7 fL (ref 79.3–98.0)
MONO#: 1.8 10*3/uL — ABNORMAL HIGH (ref 0.1–0.9)
MONO%: 8 % (ref 0.0–14.0)
NEUT#: 18.5 10*3/uL — ABNORMAL HIGH (ref 1.5–6.5)
NEUT%: 82.8 % — AB (ref 39.0–75.0)
Platelets: 191 10*3/uL (ref 140–400)
RBC: 4.86 10*6/uL (ref 4.20–5.82)
RDW: 14.8 % — ABNORMAL HIGH (ref 11.0–14.6)
WBC: 22.4 10*3/uL — ABNORMAL HIGH (ref 4.0–10.3)
nRBC: 1 % — ABNORMAL HIGH (ref 0–0)

## 2013-10-20 NOTE — Assessment & Plan Note (Signed)
Recommend he increase potassium rich diet.

## 2013-10-20 NOTE — Telephone Encounter (Signed)
Per staff message and POF I have scheduled appts. Advised scheduler of appts. JMW  

## 2013-10-20 NOTE — Assessment & Plan Note (Signed)
Is improving. Continue pain medicine as needed.

## 2013-10-20 NOTE — Telephone Encounter (Signed)
Pt confirmed labs/ov per 10/15 POF, sent msg to add chemo IT  gave pt AVS..... KJ

## 2013-10-20 NOTE — Assessment & Plan Note (Signed)
This is improving. Continue wound dressing

## 2013-10-20 NOTE — Assessment & Plan Note (Signed)
He tolerated cycle one of chemotherapy very well with clinical improvement of his cancer. I plan to increase his next cycle chemotherapy to full dose if his repeat blood work next week confirmed good kidney and liver function test.

## 2013-10-20 NOTE — Progress Notes (Signed)
[place] [facility] OFFICE PROGRESS NOTE  Patient Care Team: No Pcp Per Patient as PCP - General (General Practice)  SUMMARY OF ONCOLOGIC HISTORY: Oncology History   Diffuse large B cell lymphoma   Primary site: Lymphoid Neoplasms   Staging method: AJCC 6th Edition   Clinical: Stage II signed by [name], MD on 09/23/2013  5:06 PM   Summary: Stage II       Diffuse large B cell lymphoma   09/10/2013 Imaging CT scan of the neck confirmed abnormal tonsil region with regional lymphadenopathy   09/13/2013 Procedure Accession: [id number] consult biopsy showed atypical cells   09/21/2013 Surgery He underwent tonsil biopsy and right lymph node excisional biopsy.   09/21/2013 Pathology Results Accession: [id number] biopsy from tonsil confirmed diffuse large B-cell lymphoma   09/24/2013 Imaging PET CT scan confirmed abnormal oropharyngeal mass with regional lymphadenopathy   09/26/2013 Bone Marrow Biopsy Bone marrow biopsy showed no involvement of lymphoma   10/07/2013 -  Chemotherapy He received cycle 1 of R. CHOP chemotherapy with dose adjustment due to abnormal liver function test along with prophylactic intrathecal chemotherapy with methotrexate    INTERVAL HISTORY: Please see below for problem oriented charting. Patient is feeling much better. The size of his neck mass is smaller and he swallowed better. He is hungry. Denies any nausea. He has mild headaches after intrathecal chemotherapy, resolved.  REVIEW OF SYSTEMS:   Constitutional: Denies fevers, chills or abnormal weight loss Eyes: Denies blurriness of vision Ears, nose, mouth, throat, and face: Denies mucositis or sore throat Respiratory: Denies cough, dyspnea or wheezes Cardiovascular: Denies palpitation, chest discomfort or lower extremity swelling Gastrointestinal:  Denies nausea, heartburn or change in bowel habits Skin: Denies abnormal skin rashes Lymphatics: Denies new lymphadenopathy or easy  bruising Neurological:Denies numbness, tingling or new weaknesses Behavioral/Psych: Mood is stable, no new changes  All other systems were reviewed with the patient and are negative.  I have reviewed the past medical history, past surgical history, social history and family history with the patient and they are unchanged from previous note.  ALLERGIES:  is allergic to penicillins.  MEDICATIONS:  Current Outpatient Prescriptions  Medication Sig Dispense Refill  . allopurinol (ZYLOPRIM) 300 MG tablet Take 1 tablet (300 mg total) by mouth daily.  30 tablet  3  . Gauze Pads & Dressings (DRESSING SPONGES) 4"X4" PADS 4/4/ pads, 1 bottle of Normal saline, Gauge for Neck bandage (wrap) - daily dressing change, 10 day supply, 1 refill.  Kerlix neck wrap daily and prn.  OK to clean around wound with 4x4's and saline. Do not remove the interior packing.  10 each  1  . HYDROcodone-acetaminophen (HYCET) 7.5-325 mg/15 ml solution Take 10 mLs by mouth every 6 (six) hours as needed for severe pain (or cough).  120 mL  0  . ondansetron (ZOFRAN) 8 MG tablet Take 1 tablet (8 mg total) by mouth every 8 (eight) hours as needed for nausea.  30 tablet  1  . predniSONE (DELTASONE) 20 MG tablet Take 4 tablets (80 mg total) by mouth daily. Take on days 1-5 of chemotherapy.  60 tablet  6  . prochlorperazine (COMPAZINE) 10 MG tablet Take 1 tablet (10 mg total) by mouth every 6 (six) hours as needed (Nausea or vomiting).  30 tablet  6   No current facility-administered medications for this visit.    PHYSICAL EXAMINATION: ECOG PERFORMANCE STATUS: 1 - Symptomatic but completely ambulatory  Filed Vitals:   10/20/13 1210  BP: 134/69  Pulse: 95  Temp: 98.4 F (36.9 C)  Resp: 20   Filed Weights   10/20/13 1210  Weight: 186 lb 4.8 oz (84.505 kg)    GENERAL:alert, no distress and comfortable SKIN: skin color, texture, turgor are normal, no rashes or significant lesions EYES: normal, Conjunctiva are pink and  non-injected, sclera clear OROPHARYNX:no exudate, no erythema and lips, buccal mucosa, and tongue normal . His tonsil mass is regressed in size NECK: The neck mass has improved in size LYMPH:  no palpable lymphadenopathy in the cervical, axillary or inguinal LUNGS: clear to auscultation and percussion with normal breathing effort HEART: regular rate & rhythm and no murmurs and no lower extremity edema ABDOMEN:abdomen soft, non-tender and normal bowel sounds Musculoskeletal:no cyanosis of digits and no clubbing  NEURO: alert & oriented x 3 with fluent speech, no focal motor/sensory deficits  LABORATORY DATA:  I have reviewed the data as listed    Component Value Date/Time   NA 138 10/20/2013 1159   NA 141 09/30/2013 0630   K 3.0* 10/20/2013 1159   K 4.1 09/30/2013 0630   CL 102 09/30/2013 0630   CO2 24 10/20/2013 1159   CO2 29 09/30/2013 0630   GLUCOSE 111 10/20/2013 1159   GLUCOSE 71 09/30/2013 0630   BUN 27.2* 10/20/2013 1159   BUN 17 09/30/2013 0630   CREATININE 1.1 10/20/2013 1159   CREATININE 1.00 09/30/2013 0630   CALCIUM 9.6 10/20/2013 1159   CALCIUM 8.8 09/30/2013 0630   PROT 7.0 10/20/2013 1159   PROT 7.1 09/30/2013 0630   ALBUMIN 3.2* 10/20/2013 1159   ALBUMIN 2.6* 09/30/2013 0630   AST 19 10/20/2013 1159   AST 40* 09/30/2013 0630   ALT 37 10/20/2013 1159   ALT 109* 09/30/2013 0630   ALKPHOS 148 10/20/2013 1159   ALKPHOS 105 09/30/2013 0630   BILITOT 0.26 10/20/2013 1159   BILITOT 0.3 09/30/2013 0630   GFRNONAA 78* 09/30/2013 0630   GFRAA >90 09/30/2013 0630    No results found for this basename: SPEP, UPEP,  kappa and lambda light chains    Lab Results  Component Value Date   WBC 22.4* 10/20/2013   NEUTROABS 18.5* 10/20/2013   HGB 14.0 10/20/2013   HCT 42.1 10/20/2013   MCV 86.7 10/20/2013   PLT 191 10/20/2013      Chemistry      Component Value Date/Time   NA 138 10/20/2013 1159   NA 141 09/30/2013 0630   K 3.0* 10/20/2013 1159   K 4.1 09/30/2013 0630   CL  102 09/30/2013 0630   CO2 24 10/20/2013 1159   CO2 29 09/30/2013 0630   BUN 27.2* 10/20/2013 1159   BUN 17 09/30/2013 0630   CREATININE 1.1 10/20/2013 1159   CREATININE 1.00 09/30/2013 0630      Component Value Date/Time   CALCIUM 9.6 10/20/2013 1159   CALCIUM 8.8 09/30/2013 0630   ALKPHOS 148 10/20/2013 1159   ALKPHOS 105 09/30/2013 0630   AST 19 10/20/2013 1159   AST 40* 09/30/2013 0630   ALT 37 10/20/2013 1159   ALT 109* 09/30/2013 0630   BILITOT 0.26 10/20/2013 1159   BILITOT 0.3 09/30/2013 0630      ASSESSMENT & PLAN:  Diffuse large B cell lymphoma He tolerated cycle one of chemotherapy very well with clinical improvement of his cancer. I plan to increase his next cycle chemotherapy to full dose if his repeat blood work next week confirmed good kidney and liver function test.  Wound discharge This is  improving. Continue wound dressing  Throat pain Is improving. Continue pain medicine as needed.  Hypokalemia Recommend he increase potassium rich diet.  All questions were answered. The patient knows to call the clinic with any problems, questions or concerns. No barriers to learning was detected. I spent 30 minutes counseling the patient face to face. The total time spent in the appointment was 40 minutes and more than 50% was on counseling and review of test results     [facility], [place], MD 10/20/2013 8:55 PM

## 2013-10-21 ENCOUNTER — Ambulatory Visit: Payer: BC Managed Care – PPO

## 2013-10-21 ENCOUNTER — Encounter: Payer: Self-pay | Admitting: Nurse Practitioner

## 2013-10-21 NOTE — Progress Notes (Signed)
Per request, current medication list faxed to case manager at [facility], RN on 10/21/13

## 2013-10-26 ENCOUNTER — Encounter (HOSPITAL_COMMUNITY): Payer: Self-pay

## 2013-10-27 ENCOUNTER — Other Ambulatory Visit: Payer: Self-pay | Admitting: Hematology and Oncology

## 2013-10-27 ENCOUNTER — Other Ambulatory Visit (HOSPITAL_BASED_OUTPATIENT_CLINIC_OR_DEPARTMENT_OTHER): Payer: BC Managed Care – PPO

## 2013-10-27 ENCOUNTER — Ambulatory Visit: Payer: BC Managed Care – PPO

## 2013-10-27 ENCOUNTER — Ambulatory Visit: Payer: BC Managed Care – PPO | Admitting: Nutrition

## 2013-10-27 ENCOUNTER — Ambulatory Visit (HOSPITAL_BASED_OUTPATIENT_CLINIC_OR_DEPARTMENT_OTHER): Payer: BC Managed Care – PPO

## 2013-10-27 VITALS — BP 129/59 | HR 87 | Temp 97.9°F | Resp 20

## 2013-10-27 DIAGNOSIS — C8330 Diffuse large B-cell lymphoma, unspecified site: Secondary | ICD-10-CM

## 2013-10-27 DIAGNOSIS — Z5112 Encounter for antineoplastic immunotherapy: Secondary | ICD-10-CM

## 2013-10-27 DIAGNOSIS — C8331 Diffuse large B-cell lymphoma, lymph nodes of head, face, and neck: Secondary | ICD-10-CM

## 2013-10-27 DIAGNOSIS — Z5111 Encounter for antineoplastic chemotherapy: Secondary | ICD-10-CM

## 2013-10-27 DIAGNOSIS — C770 Secondary and unspecified malignant neoplasm of lymph nodes of head, face and neck: Secondary | ICD-10-CM

## 2013-10-27 LAB — COMPREHENSIVE METABOLIC PANEL (CC13)
ALK PHOS: 123 U/L (ref 40–150)
ALT: 35 U/L (ref 0–55)
ANION GAP: 12 meq/L — AB (ref 3–11)
AST: 15 U/L (ref 5–34)
Albumin: 3.5 g/dL (ref 3.5–5.0)
BUN: 19.1 mg/dL (ref 7.0–26.0)
CO2: 22 meq/L (ref 22–29)
Calcium: 9.7 mg/dL (ref 8.4–10.4)
Chloride: 106 mEq/L (ref 98–109)
Creatinine: 1 mg/dL (ref 0.7–1.3)
GLUCOSE: 207 mg/dL — AB (ref 70–140)
POTASSIUM: 3.6 meq/L (ref 3.5–5.1)
Sodium: 140 mEq/L (ref 136–145)
TOTAL PROTEIN: 7.7 g/dL (ref 6.4–8.3)
Total Bilirubin: 0.45 mg/dL (ref 0.20–1.20)

## 2013-10-27 LAB — CBC WITH DIFFERENTIAL/PLATELET
BASO%: 1.2 % (ref 0.0–2.0)
Basophils Absolute: 0.2 10*3/uL — ABNORMAL HIGH (ref 0.0–0.1)
EOS ABS: 0 10*3/uL (ref 0.0–0.5)
EOS%: 0 % (ref 0.0–7.0)
HCT: 43.7 % (ref 38.4–49.9)
HGB: 14.5 g/dL (ref 13.0–17.1)
LYMPH%: 7.2 % — ABNORMAL LOW (ref 14.0–49.0)
MCH: 29 pg (ref 27.2–33.4)
MCHC: 33.1 g/dL (ref 32.0–36.0)
MCV: 87.4 fL (ref 79.3–98.0)
MONO#: 0.5 10*3/uL (ref 0.1–0.9)
MONO%: 2.5 % (ref 0.0–14.0)
NEUT%: 89.1 % — ABNORMAL HIGH (ref 39.0–75.0)
NEUTROS ABS: 18.1 10*3/uL — AB (ref 1.5–6.5)
Platelets: 213 10*3/uL (ref 140–400)
RBC: 5 10*6/uL (ref 4.20–5.82)
RDW: 15 % — AB (ref 11.0–14.6)
WBC: 20.3 10*3/uL — AB (ref 4.0–10.3)
lymph#: 1.5 10*3/uL (ref 0.9–3.3)

## 2013-10-27 MED ORDER — ONDANSETRON 16 MG/50ML IVPB (CHCC)
INTRAVENOUS | Status: AC
  Filled 2013-10-27: qty 16

## 2013-10-27 MED ORDER — DEXAMETHASONE SODIUM PHOSPHATE 20 MG/5ML IJ SOLN
INTRAMUSCULAR | Status: AC
  Filled 2013-10-27: qty 5

## 2013-10-27 MED ORDER — SODIUM CHLORIDE 0.9 % IV SOLN
750.0000 mg/m2 | Freq: Once | INTRAVENOUS | Status: AC
  Administered 2013-10-27: 1440 mg via INTRAVENOUS
  Filled 2013-10-27: qty 72

## 2013-10-27 MED ORDER — DIPHENHYDRAMINE HCL 25 MG PO CAPS
ORAL_CAPSULE | ORAL | Status: AC
  Filled 2013-10-27: qty 2

## 2013-10-27 MED ORDER — DIPHENHYDRAMINE HCL 25 MG PO CAPS
50.0000 mg | ORAL_CAPSULE | Freq: Once | ORAL | Status: AC
  Administered 2013-10-27: 50 mg via ORAL

## 2013-10-27 MED ORDER — SODIUM CHLORIDE 0.9 % IV SOLN
375.0000 mg/m2 | Freq: Once | INTRAVENOUS | Status: AC
  Administered 2013-10-27: 700 mg via INTRAVENOUS
  Filled 2013-10-27: qty 70

## 2013-10-27 MED ORDER — SODIUM CHLORIDE 0.9 % IV SOLN
Freq: Once | INTRAVENOUS | Status: AC
  Administered 2013-10-27: 10:00:00 via INTRAVENOUS

## 2013-10-27 MED ORDER — ACETAMINOPHEN 325 MG PO TABS
ORAL_TABLET | ORAL | Status: AC
  Filled 2013-10-27: qty 2

## 2013-10-27 MED ORDER — VINCRISTINE SULFATE CHEMO INJECTION 1 MG/ML
2.0000 mg | Freq: Once | INTRAVENOUS | Status: AC
  Administered 2013-10-27: 2 mg via INTRAVENOUS
  Filled 2013-10-27: qty 2

## 2013-10-27 MED ORDER — ACETAMINOPHEN 325 MG PO TABS
650.0000 mg | ORAL_TABLET | Freq: Once | ORAL | Status: AC
  Administered 2013-10-27: 650 mg via ORAL

## 2013-10-27 MED ORDER — DOXORUBICIN HCL CHEMO IV INJECTION 2 MG/ML
50.0000 mg/m2 | Freq: Once | INTRAVENOUS | Status: AC
  Administered 2013-10-27: 96 mg via INTRAVENOUS
  Filled 2013-10-27: qty 48

## 2013-10-27 MED ORDER — ONDANSETRON 16 MG/50ML IVPB (CHCC)
16.0000 mg | Freq: Once | INTRAVENOUS | Status: AC
  Administered 2013-10-27: 16 mg via INTRAVENOUS

## 2013-10-27 MED ORDER — DEXAMETHASONE SODIUM PHOSPHATE 20 MG/5ML IJ SOLN
20.0000 mg | Freq: Once | INTRAMUSCULAR | Status: AC
  Administered 2013-10-27: 20 mg via INTRAVENOUS

## 2013-10-27 NOTE — Progress Notes (Signed)
Patient reports he feels well.  His appetite has increased and he is eating much better.  Weight has increased was documented as 186.8 pounds October 15, up from 179.6 pounds October 1.  Patient reports nausea only occurs on the days around chemotherapy.  Medications improve nausea.  His swallowing is better.  Labs noted, potassium 3.6 in improved.  Glucose elevated at 207.  Nutrition diagnosis: Inadequate oral intake resolved.  Patient to continue diet as tolerated along with oral nutrition supplements if needed to promote weight stabilization.  Patient encouraged to contact me with questions or concerns.  He has my contact information.  No Followup scheduled at this time, however, I will continue to be available to patient as needed.  **Disclaimer: This note was dictated with voice recognition software. Similar sounding words can inadvertently be transcribed and this note may contain transcription errors which may not have been corrected upon publication of note.**

## 2013-10-27 NOTE — Patient Instructions (Signed)
[facility] Discharge Instructions for Patients Receiving Chemotherapy  Today you received the following chemotherapy agents Adriamycin, Vincristine, Cytoxan and Rituxan.  STOP YOUR BLOOD PRESSURE MEDICINE UNTIL FURTHER INSTRUCTIONS BY DR [facility]. THIS IS DUE TO YOUR KIDNEY FUNCTION LABS.   To help prevent nausea and vomiting after your treatment, we encourage you to take your nausea medication.   If you develop nausea and vomiting that is not controlled by your nausea medication, call the clinic.   BELOW ARE SYMPTOMS THAT SHOULD BE REPORTED IMMEDIATELY:  *FEVER GREATER THAN 100.5 F  *CHILLS WITH OR WITHOUT FEVER  NAUSEA AND VOMITING THAT IS NOT CONTROLLED WITH YOUR NAUSEA MEDICATION  *UNUSUAL SHORTNESS OF BREATH  *UNUSUAL BRUISING OR BLEEDING  TENDERNESS IN MOUTH AND THROAT WITH OR WITHOUT PRESENCE OF ULCERS  *URINARY PROBLEMS  *BOWEL PROBLEMS  UNUSUAL RASH Items with * indicate a potential emergency and should be followed up as soon as possible.  Feel free to call the clinic you have any questions or concerns. The clinic phone number is [phone].

## 2013-11-01 ENCOUNTER — Ambulatory Visit (HOSPITAL_BASED_OUTPATIENT_CLINIC_OR_DEPARTMENT_OTHER): Payer: BC Managed Care – PPO

## 2013-11-01 ENCOUNTER — Ambulatory Visit (HOSPITAL_BASED_OUTPATIENT_CLINIC_OR_DEPARTMENT_OTHER): Payer: BC Managed Care – PPO | Admitting: Hematology and Oncology

## 2013-11-01 ENCOUNTER — Telehealth: Payer: Self-pay | Admitting: Hematology and Oncology

## 2013-11-01 ENCOUNTER — Telehealth: Payer: Self-pay | Admitting: *Deleted

## 2013-11-01 ENCOUNTER — Ambulatory Visit: Payer: BC Managed Care – PPO

## 2013-11-01 ENCOUNTER — Encounter: Payer: Self-pay | Admitting: Hematology and Oncology

## 2013-11-01 VITALS — BP 146/76 | HR 87 | Temp 97.9°F | Resp 20

## 2013-11-01 DIAGNOSIS — C8330 Diffuse large B-cell lymphoma, unspecified site: Secondary | ICD-10-CM

## 2013-11-01 DIAGNOSIS — C8331 Diffuse large B-cell lymphoma, lymph nodes of head, face, and neck: Secondary | ICD-10-CM

## 2013-11-01 DIAGNOSIS — Z5189 Encounter for other specified aftercare: Secondary | ICD-10-CM

## 2013-11-01 MED ORDER — SODIUM CHLORIDE 0.9 % IV SOLN
INTRAVENOUS | Status: AC
  Administered 2013-11-01: 10:00:00 via INTRAVENOUS

## 2013-11-01 MED ORDER — TRAZODONE HCL 100 MG PO TABS: 100.0000 mg | ORAL_TABLET | Freq: Every day | ORAL | Status: DC

## 2013-11-01 MED ORDER — HYDROCODONE-ACETAMINOPHEN 7.5-325 MG/15ML PO SOLN: 10.0000 mL | Freq: Four times a day (QID) | ORAL | Status: DC | PRN

## 2013-11-01 MED ORDER — PEGFILGRASTIM INJECTION 6 MG/0.6ML
6.0000 mg | Freq: Once | SUBCUTANEOUS | Status: AC
  Administered 2013-11-01: 6 mg via SUBCUTANEOUS
  Filled 2013-11-01: qty 0.6

## 2013-11-01 MED ORDER — SODIUM CHLORIDE 0.9 % IJ SOLN
Freq: Once | INTRAMUSCULAR | Status: DC
  Filled 2013-11-01: qty 0.48

## 2013-11-01 MED ORDER — TRAZODONE HCL 100 MG PO TABS
100.0000 mg | ORAL_TABLET | Freq: Every day | ORAL | Status: DC
Start: 2013-11-01 — End: 2013-12-16

## 2013-11-01 MED ORDER — OXYCODONE HCL 10 MG PO TABS: 10.0000 mg | ORAL_TABLET | ORAL | Status: DC | PRN

## 2013-11-01 NOTE — Progress Notes (Signed)
The patient tolerated last cycle chemotherapy well. He complained of some persistent throat pain and requests a prescription of oxycodone. He did not sleep well at night.  On exam, his neck wound has completely resolved.  Diagnostic Lumbar Puncture and Intrathecal administration of chemotherapy Procedure Note   Informed consent was obtained and potential risks including bleeding, infection and pain were reviewed with the patient.  The patient's name, date of birth, identification, consent and allergies were verified prior to the start of procedure and time out was performed.  The skin was prepped with Betadine solution.   3 cc of 1% lidocaine was used to provide local anaesthesia.   The L3/L4 intrathecal space was chosen as the site of procedure.  1 cc of clear CSF was noted.  Next, 5 ml/12 mg methotrexate was administered into the intrathecal space.  The procedure was tolerated well and there were no complications.  The patient was stable at the end of the procedure.  I went and prescribe trazodone and oxycodone to address his symptoms. Total time spent with the patient is 30 minutes all on counseling and procedure.

## 2013-11-01 NOTE — Telephone Encounter (Signed)
Per staff message and POF I have scheduled appts. Advised scheduler of appts. JMW  

## 2013-11-01 NOTE — Patient Instructions (Signed)
Inyeccin espinal, Cuidados posteriores (Spinal Injection, Care After) Estas instrucciones le dan informacin sobre cmo cuidarse despus del procedimiento. Su mdico tambin puede darle instrucciones especficas. Comunquese con el profesional si tiene algn problema o surgen preguntas. CUIDADOS EN EL HOGAR  Evite conducir vehculos o [place] 24 horas (1 da).  No realice actividad fsica extenuante en las prximas 24 horas.  No utilice almohadillas trmicas ni coloque toallas calientes en el lugar donde le aplicaron la vacuna (inyeccin) durante las prximas 24 horas.  Puede tomar Coll[place] Mares. No tome baos de inmersin, ni se sumerja en el agua, ni practique natacin durante las prximas 24 horas.  Puede volver a su dieta habitual.  Puede quitarse los vendajes a la maana siguiente.  Comunquese para solicitar una cita de control para dentro de 4 a 6 semanas. EFECTOS SECUNDARIOS QUE PUEDEN OCURRIR HASTA 4 HORAS DESPUS DE LA INYECCIN  Si siente debilidad o entumecimiento en las piernas, pida ayuda para caminar. Si el problema no desaparece, o empeora, concurra a la sala de emergencias.  Si se siente mareado, acustese de inmediato. Esto suele ser de [place]. Incorprese lentamente, y luego, cuando haya estado sentado durante 10 minutos, pngase de pie.  Si tiene Clinical cytogeneticist de cabeza leve , beba lquidos (sobre todo bebidas con cafena) y tome los medicamentos. Tome los medicamentos tal como se los prescribi el mdico. Informe a su mdico o enfermero si el dolor de cabeza no se va, o si empeora.  Cuando el medicamento que se [place] para Tourist information centre manager piel (anestesia) desaparece, podr sentir algo de dolor en el sitio de la inyeccin. Por lo general slo dura unos pocos das. Puede aplicar hielo Dance movement psychotherapist donde le aplicaron la inyeccin. Colquese una toalla entre la piel y la bolsa de hielo. Esto proteger su piel. Aplique el hielo durante 20 minutos cada  vez, y deje pasar por lo menos 2 horas entre las aplicaciones de hielo.  Puede sentir un leve dolor de espalda y rigidez en el sitio de la inyeccin. Llame a su mdico si el dolor empeora o no mejora. Podr tener nuseas o vomitar algunas horas despus de la inyeccin. Si esto sucede, trate de tomar pequeas cantidades de lquidos claros hasta que se sienta mejor. Si contina teniendo nuseas o vomitando, busque ayuda de inmediato. El corticoides que ha recibido Microbiologist, por lo tanto el dolor puede continuar durante ese [place]. La inyeccin generalmente es ms efectiva para el dolor de piernas que para el dolor de espalda. No solucionar la causa del dolor, pero lo [place]. Este alivio del dolor puede durar entre 2 semanas y 40 meses. SOLICITE AYUDA DE INMEDIATO SI:   Siente un dolor muy intenso, le duele la cabeza o siente dolor o rigidez en el cuello.  Se modifica la visin (tiene visin doble o borrosa).  Siente escalofros o fiebre.  La zona de la inyeccin se pone roja o se hincha.  Se siente dbil.  No puede controlar la orina o las heces.  No puede orinar, aunque siente ganas. ASEGRESE DE QUE:  Comprende estas instrucciones.  Controlar su enfermedad.  Solicitar ayuda de inmediato si no mejora o empeora. Document Released: 01/25/2010 Document Revised: 03/17/2011 [facility] Patient Information 2015 [place]. This information is not intended to replace advice given to you by your health care provider. Make sure you discuss any questions you have with your health care provider.

## 2013-11-01 NOTE — Telephone Encounter (Signed)
Gave avs & cal for NOV. Sent MW mess to add tx.

## 2013-11-01 NOTE — Telephone Encounter (Signed)
Confirm d/t for appt 11/16/13 & 11/17/13.

## 2013-11-04 ENCOUNTER — Telehealth: Payer: Self-pay | Admitting: *Deleted

## 2013-11-04 DIAGNOSIS — C8590 Non-Hodgkin lymphoma, unspecified, unspecified site: Secondary | ICD-10-CM

## 2013-11-04 MED ORDER — PREDNISONE 20 MG PO TABS: 60.0000 mg | ORAL_TABLET | Freq: Every day | ORAL | Status: DC

## 2013-11-04 NOTE — Telephone Encounter (Signed)
No 60 mg X 4 days after each cycle of chemo, not daily. Please prescribe enough only for 4 days

## 2013-11-04 NOTE — Telephone Encounter (Signed)
Rx sent for Prednisone and instructed daughter in law on pt taking 3 tabs daily for four days after each chemo cycle.  She verbalized understanding.

## 2013-11-04 NOTE — Telephone Encounter (Signed)
Pt needs new rx prednisone sent to [facility].  States dose was changed to three tablets per day every day?

## 2013-11-08 ENCOUNTER — Telehealth: Payer: Self-pay | Admitting: *Deleted

## 2013-11-08 NOTE — Telephone Encounter (Signed)
Daughter in law left VM states the incision where pt had biopsy done by Dr. Erik Obey is red and swollen.  Called her back and instructed per Dr. Alvy Bimler to call Dr. Erik Obey to assess and treat.  Asked her to please call back to let us know what happens.

## 2013-11-09 ENCOUNTER — Telehealth: Payer: Self-pay | Admitting: *Deleted

## 2013-11-09 ENCOUNTER — Encounter: Payer: Self-pay | Admitting: *Deleted

## 2013-11-09 NOTE — Telephone Encounter (Signed)
Daughter in law reports Dr. Erik Obey prescribed two antibiotics for pt and wants to make sure ok for pt to take.  Instructed ok w/ Dr. Alvy Bimler for pt to take antibiotics as prescribed.  She verbalized understanding.

## 2013-11-09 NOTE — Telephone Encounter (Signed)
Family of Norman [name] in lobby with questions about medications picked up from pharmacy.  Have Cipro 500 mg and Clindamycin 152 mg prescribed by Dr. Erik Obey.  Currently uses Trazodone at bedtime.  Interpreter in lobby assisted them.  "Taking antibiotics and a sleeping medicine and afraid of reaction of both medicines taken at the same time."    No cardiac history noted in problem list.  Assured them of no reaction with these antibiotics and trazodone.

## 2013-11-10 ENCOUNTER — Other Ambulatory Visit: Payer: Self-pay | Admitting: Otolaryngology

## 2013-11-10 DIAGNOSIS — T814XXD Infection following a procedure, subsequent encounter: Principal | ICD-10-CM

## 2013-11-10 DIAGNOSIS — IMO0001 Reserved for inherently not codable concepts without codable children: Secondary | ICD-10-CM

## 2013-11-15 ENCOUNTER — Telehealth: Payer: Self-pay | Admitting: *Deleted

## 2013-11-15 ENCOUNTER — Ambulatory Visit
Admission: RE | Admit: 2013-11-15 | Discharge: 2013-11-15 | Disposition: A | Discharge status: Home / Self Care | Payer: BC Managed Care – PPO | Source: Ambulatory Visit | Attending: Otolaryngology | Admitting: Otolaryngology

## 2013-11-15 DIAGNOSIS — IMO0001 Reserved for inherently not codable concepts without codable children: Secondary | ICD-10-CM

## 2013-11-15 DIAGNOSIS — T814XXD Infection following a procedure, subsequent encounter: Principal | ICD-10-CM

## 2013-11-15 MED ORDER — IOHEXOL 300 MG/ML  SOLN
75.0000 mL | Freq: Once | INTRAMUSCULAR | Status: AC | PRN
  Administered 2013-11-15: 75 mL via INTRAVENOUS

## 2013-11-15 NOTE — Telephone Encounter (Signed)
I have called and moved 11/12 appt to a later time. Daughter aware

## 2013-11-15 NOTE — Telephone Encounter (Signed)
Daughter in law called to confirm pt's appts tomorrow and Thursday.

## 2013-11-16 ENCOUNTER — Telehealth: Payer: Self-pay | Admitting: Hematology and Oncology

## 2013-11-16 ENCOUNTER — Other Ambulatory Visit (HOSPITAL_BASED_OUTPATIENT_CLINIC_OR_DEPARTMENT_OTHER): Payer: BC Managed Care – PPO

## 2013-11-16 ENCOUNTER — Ambulatory Visit (HOSPITAL_BASED_OUTPATIENT_CLINIC_OR_DEPARTMENT_OTHER): Payer: BC Managed Care – PPO | Admitting: Hematology and Oncology

## 2013-11-16 VITALS — BP 124/69 | HR 108 | Temp 97.9°F | Resp 18 | Ht 61.0 in | Wt 194.5 lb

## 2013-11-16 DIAGNOSIS — C8330 Diffuse large B-cell lymphoma, unspecified site: Secondary | ICD-10-CM

## 2013-11-16 DIAGNOSIS — T148 Other injury of unspecified body region: Secondary | ICD-10-CM

## 2013-11-16 DIAGNOSIS — R070 Pain in throat: Secondary | ICD-10-CM

## 2013-11-16 DIAGNOSIS — C770 Secondary and unspecified malignant neoplasm of lymph nodes of head, face and neck: Secondary | ICD-10-CM

## 2013-11-16 DIAGNOSIS — N179 Acute kidney failure, unspecified: Secondary | ICD-10-CM

## 2013-11-16 DIAGNOSIS — T148XXA Other injury of unspecified body region, initial encounter: Secondary | ICD-10-CM

## 2013-11-16 LAB — COMPREHENSIVE METABOLIC PANEL (CC13)
ALT: 27 U/L (ref 0–55)
AST: 20 U/L (ref 5–34)
Albumin: 3.6 g/dL (ref 3.5–5.0)
Alkaline Phosphatase: 112 U/L (ref 40–150)
Anion Gap: 7 mEq/L (ref 3–11)
BUN: 12.7 mg/dL (ref 7.0–26.0)
CALCIUM: 9.7 mg/dL (ref 8.4–10.4)
CHLORIDE: 103 meq/L (ref 98–109)
CO2: 28 mEq/L (ref 22–29)
CREATININE: 1.4 mg/dL — AB (ref 0.7–1.3)
Glucose: 257 mg/dl — ABNORMAL HIGH (ref 70–140)
Potassium: 4.1 mEq/L (ref 3.5–5.1)
SODIUM: 139 meq/L (ref 136–145)
TOTAL PROTEIN: 7.5 g/dL (ref 6.4–8.3)
Total Bilirubin: 0.33 mg/dL (ref 0.20–1.20)

## 2013-11-16 LAB — CBC WITH DIFFERENTIAL/PLATELET
BASO%: 1 % (ref 0.0–2.0)
Basophils Absolute: 0.1 10*3/uL (ref 0.0–0.1)
EOS%: 0.7 % (ref 0.0–7.0)
Eosinophils Absolute: 0.1 10*3/uL (ref 0.0–0.5)
HEMATOCRIT: 41.7 % (ref 38.4–49.9)
HGB: 13.7 g/dL (ref 13.0–17.1)
LYMPH%: 16.2 % (ref 14.0–49.0)
MCH: 28.9 pg (ref 27.2–33.4)
MCHC: 32.9 g/dL (ref 32.0–36.0)
MCV: 87.9 fL (ref 79.3–98.0)
MONO#: 1.4 10*3/uL — AB (ref 0.1–0.9)
MONO%: 10.4 % (ref 0.0–14.0)
NEUT%: 71.7 % (ref 39.0–75.0)
NEUTROS ABS: 9.8 10*3/uL — AB (ref 1.5–6.5)
PLATELETS: 388 10*3/uL (ref 140–400)
RBC: 4.75 10*6/uL (ref 4.20–5.82)
RDW: 16.4 % — ABNORMAL HIGH (ref 11.0–14.6)
WBC: 13.6 10*3/uL — AB (ref 4.0–10.3)
lymph#: 2.2 10*3/uL (ref 0.9–3.3)

## 2013-11-16 MED ORDER — OXYCODONE HCL 30 MG PO TABS: 30.0000 mg | ORAL_TABLET | Freq: Four times a day (QID) | ORAL | Status: DC | PRN

## 2013-11-16 NOTE — Assessment & Plan Note (Signed)
He has intermittent elevated kidney function. We will continue on slight dose adjustment for treatment.

## 2013-11-16 NOTE — Telephone Encounter (Signed)
Confirm appt d/t. Pt can pick new sch up at appt.

## 2013-11-16 NOTE — Assessment & Plan Note (Signed)
Clinically, his wound is improving. I recommend continue antibiotic therapy and wound dressing change.

## 2013-11-16 NOTE — Assessment & Plan Note (Signed)
He tolerated cchemotherapy very well with clinical improvement of his cancer. I plan to continue treatment. I will restaging with PET CT scan after next cycle. He will get intrathecal chemotherapy tomorrow. Recent CT scan suggests good response to treatment. Even though he has minor skin infection, I will proceed without delay.

## 2013-11-16 NOTE — Assessment & Plan Note (Signed)
His neck pain is better but he has recent bone pain from Neulasta injection. He requests a higher dose of pain medicine and I will prescribe oxycodone 30 mg to take as needed for pain.

## 2013-11-16 NOTE — Progress Notes (Signed)
[facility] OFFICE PROGRESS NOTE  Patient Care Team: No Pcp Per Patient as PCP - General (General Practice)  SUMMARY OF ONCOLOGIC HISTORY: Oncology History   Diffuse large B cell lymphoma   Primary site: Lymphoid Neoplasms   Staging method: AJCC 6th Edition   Clinical: Stage II signed by [name], MD on 09/23/2013  5:06 PM   Summary: Stage II       Diffuse large B cell lymphoma   09/10/2013 Imaging CT scan of the neck confirmed abnormal tonsil region with regional lymphadenopathy   09/13/2013 Procedure Accession: [id number] consult biopsy showed atypical cells   09/21/2013 Surgery He underwent tonsil biopsy and right lymph node excisional biopsy.   09/21/2013 Pathology Results Accession: [id number] biopsy from tonsil confirmed diffuse large B-cell lymphoma   09/24/2013 Imaging PET CT scan confirmed abnormal oropharyngeal mass with regional lymphadenopathy   09/26/2013 Bone Marrow Biopsy Bone marrow biopsy showed no involvement of lymphoma   10/07/2013 -  Chemotherapy He received cycle 1 of R. CHOP chemotherapy with dose adjustment due to abnormal liver function test along with prophylactic intrathecal chemotherapy with methotrexate   11/15/2013 Imaging CT scan of the neck show no evidence of abscess. Prior lymphadenopathy has reduced in size.    INTERVAL HISTORY: Please see below for problem oriented charting. He is seen prior to cycle 3 of therapy. He complained of body aches and pain after recent chemotherapy, which I suspect was due to Neulasta. He continues to have persistent wound drainage and was placed on antibiotic therapy with aggressing wound dressing changes.  REVIEW OF SYSTEMS:   Constitutional: Denies fevers, chills or abnormal weight loss Eyes: Denies blurriness of vision Ears, nose, mouth, throat, and face: Denies mucositis or sore throat Respiratory: Denies cough, dyspnea or wheezes Cardiovascular: Denies palpitation, chest discomfort or lower extremity  swelling Gastrointestinal:  Denies nausea, heartburn or change in bowel habits Skin: Denies abnormal skin rashes Lymphatics: Denies new lymphadenopathy or easy bruising Neurological:Denies numbness, tingling or new weaknesses Behavioral/Psych: Mood is stable, no new changes  All other systems were reviewed with the patient and are negative.  I have reviewed the past medical history, past surgical history, social history and family history with the patient and they are unchanged from previous note.  ALLERGIES:  is allergic to penicillins.  MEDICATIONS:  Current Outpatient Prescriptions  Medication Sig Dispense Refill  . allopurinol (ZYLOPRIM) 300 MG tablet Take 1 tablet (300 mg total) by mouth daily. 30 tablet 3  . ciprofloxacin (CIPRO) 500 MG tablet Take 500 mg by mouth 2 (two) times daily.    . clindamycin (CLEOCIN) 150 MG capsule Take 150 mg by mouth 3 (three) times daily.    . Gauze Pads & Dressings (DRESSING SPONGES) 4"X4" PADS 4/4/ pads, 1 bottle of Normal saline, Gauge for Neck bandage (wrap) - daily dressing change, 10 day supply, 1 refill.  Kerlix neck wrap daily and prn.  OK to clean around wound with 4x4's and saline. Do not remove the interior packing. 10 each 1  . ondansetron (ZOFRAN) 8 MG tablet Take 1 tablet (8 mg total) by mouth every 8 (eight) hours as needed for nausea. 30 tablet 1  . oxyCODONE 10 MG TABS Take 1 tablet (10 mg total) by mouth every 4 (four) hours as needed for severe pain. 60 tablet 0  . predniSONE (DELTASONE) 20 MG tablet Take 3 tablets (60 mg total) by mouth daily. Take daily for four days after each cycle of chemotherapy. 12 tablet 5  .  prochlorperazine (COMPAZINE) 10 MG tablet Take 1 tablet (10 mg total) by mouth every 6 (six) hours as needed (Nausea or vomiting). 30 tablet 6  . traZODone (DESYREL) 100 MG tablet Take 1 tablet (100 mg total) by mouth at bedtime. 30 tablet 3  . oxyCODONE (ROXICODONE) 30 MG immediate release tablet Take 1 tablet (30 mg  total) by mouth every 6 (six) hours as needed for severe pain. 60 tablet 0   No current facility-administered medications for this visit.    PHYSICAL EXAMINATION: ECOG PERFORMANCE STATUS: 0 - Asymptomatic  Filed Vitals:   11/16/13 1056  BP: 124/69  Pulse: 108  Temp: 97.9 F (36.6 C)  Resp: 18   Filed Weights   11/16/13 1056  Weight: 194 lb 8 oz (88.225 kg)    GENERAL:alert, no distress and comfortable. He is morbidly obese SKIN: I changed the wound dressing on his neck. No evidence of cellulitis. This is much improved compared to before. EYES: normal, Conjunctiva are pink and non-injected, sclera clear OROPHARYNX:no exudate, no erythema and lips, buccal mucosa, and tongue normal . The oropharyngeal/tonsillar mass has resolved. NECK: supple, thyroid normal size, non-tender, without nodularity LYMPH:  He has persistent lymphadenopathy on the right side of his neck. LUNGS: clear to auscultation and percussion with normal breathing effort HEART: regular rate & rhythm and no murmurs and no lower extremity edema ABDOMEN:abdomen soft, non-tender and normal bowel sounds Musculoskeletal:no cyanosis of digits and no clubbing  NEURO: alert & oriented x 3 with fluent speech, no focal motor/sensory deficits  LABORATORY DATA:  I have reviewed the data as listed    Component Value Date/Time   NA 139 11/16/2013 1044   NA 141 09/30/2013 0630   K 4.1 11/16/2013 1044   K 4.1 09/30/2013 0630   CL 102 09/30/2013 0630   CO2 28 11/16/2013 1044   CO2 29 09/30/2013 0630   GLUCOSE 257* 11/16/2013 1044   GLUCOSE 71 09/30/2013 0630   BUN 12.7 11/16/2013 1044   BUN 17 09/30/2013 0630   CREATININE 1.4* 11/16/2013 1044   CREATININE 1.00 09/30/2013 0630   CALCIUM 9.7 11/16/2013 1044   CALCIUM 8.8 09/30/2013 0630   PROT 7.5 11/16/2013 1044   PROT 7.1 09/30/2013 0630   ALBUMIN 3.6 11/16/2013 1044   ALBUMIN 2.6* 09/30/2013 0630   AST 20 11/16/2013 1044   AST 40* 09/30/2013 0630   ALT 27  11/16/2013 1044   ALT 109* 09/30/2013 0630   ALKPHOS 112 11/16/2013 1044   ALKPHOS 105 09/30/2013 0630   BILITOT 0.33 11/16/2013 1044   BILITOT 0.3 09/30/2013 0630   GFRNONAA 78* 09/30/2013 0630   GFRAA >90 09/30/2013 0630    No results found for: SPEP, UPEP  Lab Results  Component Value Date   WBC 13.6* 11/16/2013   NEUTROABS 9.8* 11/16/2013   HGB 13.7 11/16/2013   HCT 41.7 11/16/2013   MCV 87.9 11/16/2013   PLT 388 11/16/2013      Chemistry      Component Value Date/Time   NA 139 11/16/2013 1044   NA 141 09/30/2013 0630   K 4.1 11/16/2013 1044   K 4.1 09/30/2013 0630   CL 102 09/30/2013 0630   CO2 28 11/16/2013 1044   CO2 29 09/30/2013 0630   BUN 12.7 11/16/2013 1044   BUN 17 09/30/2013 0630   CREATININE 1.4* 11/16/2013 1044   CREATININE 1.00 09/30/2013 0630      Component Value Date/Time   CALCIUM 9.7 11/16/2013 1044   CALCIUM 8.8 09/30/2013  0630   ALKPHOS 112 11/16/2013 1044   ALKPHOS 105 09/30/2013 0630   AST 20 11/16/2013 1044   AST 40* 09/30/2013 0630   ALT 27 11/16/2013 1044   ALT 109* 09/30/2013 0630   BILITOT 0.33 11/16/2013 1044   BILITOT 0.3 09/30/2013 0630       RADIOGRAPHIC STUDIES: I have personally reviewed the radiological images as listed and agreed with the findings in the report. Ct Soft Tissue Neck W Contrast  11/15/2013   CLINICAL DATA:  Postoperative wound infection, subsequent encounter. Large B-cell lymphoma involving the right neck and right tonsil.  EXAM: CT NECK WITH CONTRAST  TECHNIQUE: Multidetector CT imaging of the neck was performed using the standard protocol following the bolus administration of intravenous contrast.  CONTRAST:  60m OMNIPAQUE IOHEXOL 300 MG/ML  SOLN  COMPARISON:  CT neck 09/10/2013  FINDINGS: Patient is status post right tonsillectomy. Previously seen mass lesion is resolved. The large necrotic right level 2 lymph node is no longer present.  No focal mucosal or submucosal lesions are present within the  supraglottic neck. The tongue base is within normal limits. The epiglottis is normal.  The vocal cords are midline and symmetric. Laryngeal cartilages are normal.  The trachea and esophagus are within normal limits. The thyroid is unremarkable.  No significant left-sided adenopathy is present. A posterior right level 2 lymph node is markedly decreased in size, now measuring 11 mm.  There is matted soft tissue lateral to the right carotid sheath at the level of the hyoid. The right internal jugular vein Ms. compressed.  Right level 3 lymph nodes for cysts but are significantly decreased in size compared to the prior exam. The largest node measures 19 x 11 mm on the axial images. These residual nodes are hypodense.  A posterior right level 3 lymph node measures 13 mm, significantly decreased in size from the prior study.  The right supraclavicular and level 4 lymph nodes are now sub cm.  No new nodes are present.  There is no discrete abscess.  Multilevel endplate degenerative changes are most pronounced at C5-6 with uncovertebral spurring and foraminal narrowing bilaterally.  The lung apices are clear.  IMPRESSION: 1. Status post right tonsillectomy. 2. Decreased size of lymph nodes at the right level 2, level 3, and level 4 stations. 3. Low-density nodes are present on the right without evidence for abscess. 4. There is matted soft tissue on the right at the level of the high delayed without a discrete abscess. 5. The right into no scratch the of the right internal jugular vein is compressed, as on the prior study.   Electronically Signed   By: CLawrence SantiagoM.D.   On: 11/15/2013 16:11     ASSESSMENT & PLAN:  Diffuse large B cell lymphoma He tolerated cchemotherapy very well with clinical improvement of his cancer. I plan to continue treatment. I will restaging with PET CT scan after next cycle. He will get intrathecal chemotherapy tomorrow. Recent CT scan suggests good response to treatment. Even though he  has minor skin infection, I will proceed without delay.   Throat pain His neck pain is better but he has recent bone pain from Neulasta injection. He requests a higher dose of pain medicine and I will prescribe oxycodone 30 mg to take as needed for pain.  Wound discharge Clinically, his wound is improving. I recommend continue antibiotic therapy and wound dressing change.  Acute renal failure He has intermittent elevated kidney function. We will continue on  slight dose adjustment for treatment.   Orders Placed This Encounter  Procedures  . NM PET Image Restag (PS) Skull Base To Thigh    Standing Status: Future     Number of Occurrences:      Standing Expiration Date: 01/16/2015    Order Specific Question:  Reason for Exam (SYMPTOM  OR DIAGNOSIS REQUIRED)    Answer:  staging lymphoma    Order Specific Question:  Preferred imaging location?    Answer:  [facility]   All questions were answered. The patient knows to call the clinic with any problems, questions or concerns. No barriers to learning was detected. I spent 30 minutes counseling the patient face to face. The total time spent in the appointment was 40 minutes and more than 50% was on counseling and review of test results     [facility], [place], MD 11/16/2013 3:00 PM

## 2013-11-17 ENCOUNTER — Other Ambulatory Visit: Payer: BC Managed Care – PPO

## 2013-11-17 ENCOUNTER — Ambulatory Visit (HOSPITAL_BASED_OUTPATIENT_CLINIC_OR_DEPARTMENT_OTHER): Payer: BC Managed Care – PPO

## 2013-11-17 ENCOUNTER — Ambulatory Visit: Payer: BC Managed Care – PPO | Admitting: Hematology and Oncology

## 2013-11-17 ENCOUNTER — Other Ambulatory Visit: Payer: Self-pay | Admitting: Hematology and Oncology

## 2013-11-17 ENCOUNTER — Encounter: Payer: Self-pay | Admitting: Hematology and Oncology

## 2013-11-17 DIAGNOSIS — Z5112 Encounter for antineoplastic immunotherapy: Secondary | ICD-10-CM

## 2013-11-17 DIAGNOSIS — C8330 Diffuse large B-cell lymphoma, unspecified site: Secondary | ICD-10-CM

## 2013-11-17 DIAGNOSIS — Z5111 Encounter for antineoplastic chemotherapy: Secondary | ICD-10-CM

## 2013-11-17 LAB — WOUND CULTURE: SPECIAL REQUESTS: NORMAL

## 2013-11-17 MED ORDER — SODIUM CHLORIDE 0.9 % IV SOLN
750.0000 mg/m2 | Freq: Once | INTRAVENOUS | Status: AC
  Administered 2013-11-17: 1440 mg via INTRAVENOUS
  Filled 2013-11-17: qty 72

## 2013-11-17 MED ORDER — DIPHENHYDRAMINE HCL 25 MG PO CAPS
50.0000 mg | ORAL_CAPSULE | Freq: Once | ORAL | Status: AC
  Administered 2013-11-17: 50 mg via ORAL

## 2013-11-17 MED ORDER — DEXAMETHASONE SODIUM PHOSPHATE 20 MG/5ML IJ SOLN
20.0000 mg | Freq: Once | INTRAMUSCULAR | Status: AC
  Administered 2013-11-17: 20 mg via INTRAVENOUS

## 2013-11-17 MED ORDER — DIPHENHYDRAMINE HCL 25 MG PO CAPS
ORAL_CAPSULE | ORAL | Status: AC
  Filled 2013-11-17: qty 2

## 2013-11-17 MED ORDER — DEXAMETHASONE SODIUM PHOSPHATE 20 MG/5ML IJ SOLN
INTRAMUSCULAR | Status: AC
  Filled 2013-11-17: qty 5

## 2013-11-17 MED ORDER — SODIUM CHLORIDE 0.9 % IV SOLN
375.0000 mg/m2 | Freq: Once | INTRAVENOUS | Status: AC
  Administered 2013-11-17: 700 mg via INTRAVENOUS
  Filled 2013-11-17: qty 70

## 2013-11-17 MED ORDER — ACETAMINOPHEN 325 MG PO TABS
ORAL_TABLET | ORAL | Status: AC
  Filled 2013-11-17: qty 2

## 2013-11-17 MED ORDER — ONDANSETRON 16 MG/50ML IVPB (CHCC)
INTRAVENOUS | Status: AC
  Filled 2013-11-17: qty 16

## 2013-11-17 MED ORDER — ONDANSETRON 16 MG/50ML IVPB (CHCC)
16.0000 mg | Freq: Once | INTRAVENOUS | Status: AC
  Administered 2013-11-17: 16 mg via INTRAVENOUS

## 2013-11-17 MED ORDER — SODIUM CHLORIDE 0.9 % IV SOLN
Freq: Once | INTRAVENOUS | Status: AC
  Administered 2013-11-17: 14:00:00 via INTRAVENOUS

## 2013-11-17 MED ORDER — DOXORUBICIN HCL CHEMO IV INJECTION 2 MG/ML
50.0000 mg/m2 | Freq: Once | INTRAVENOUS | Status: AC
  Administered 2013-11-17: 96 mg via INTRAVENOUS
  Filled 2013-11-17: qty 48

## 2013-11-17 MED ORDER — ACETAMINOPHEN 325 MG PO TABS
650.0000 mg | ORAL_TABLET | Freq: Once | ORAL | Status: AC
  Administered 2013-11-17: 650 mg via ORAL

## 2013-11-17 MED ORDER — VINCRISTINE SULFATE CHEMO INJECTION 1 MG/ML
2.0000 mg | Freq: Once | INTRAVENOUS | Status: AC
  Administered 2013-11-17: 2 mg via INTRAVENOUS
  Filled 2013-11-17: qty 2

## 2013-11-17 MED ORDER — SODIUM CHLORIDE 0.9 % IV SOLN
Freq: Once | INTRAVENOUS | Status: AC
  Administered 2013-11-17: 10:00:00 via INTRAVENOUS

## 2013-11-17 MED ORDER — SODIUM CHLORIDE 0.9 % IJ SOLN
Freq: Once | INTRAMUSCULAR | Status: AC
  Administered 2013-11-17: 15:00:00 via INTRATHECAL
  Filled 2013-11-17: qty 0.48

## 2013-11-17 NOTE — Patient Instructions (Addendum)
[facility] Discharge Instructions for Patients Receiving Chemotherapy  Today you received the following chemotherapy agents:  Adriamycin, Cytoxan, Vincristine and Rituxan  To help prevent nausea and vomiting after your treatment, we encourage you to take your nausea medication as ordered per MD.   If you develop nausea and vomiting that is not controlled by your nausea medication, call the clinic.   BELOW ARE SYMPTOMS THAT SHOULD BE REPORTED IMMEDIATELY:  *FEVER GREATER THAN 100.5 F  *CHILLS WITH OR WITHOUT FEVER  NAUSEA AND VOMITING THAT IS NOT CONTROLLED WITH YOUR NAUSEA MEDICATION  *UNUSUAL SHORTNESS OF BREATH  *UNUSUAL BRUISING OR BLEEDING  TENDERNESS IN MOUTH AND THROAT WITH OR WITHOUT PRESENCE OF ULCERS  *URINARY PROBLEMS  *BOWEL PROBLEMS  UNUSUAL RASH Items with * indicate a potential emergency and should be followed up as soon as possible.  Feel free to call the clinic you have any questions or concerns. The clinic phone number is [phone].   Puncin lumbar, cuidados posteriores (Lumbar Puncture, Care After) Siga estas indicaciones durante las prximas semanas. Estas indicaciones le proporcionan informacin general acerca de cmo deber cuidarse despus del procedimiento. El mdico tambin podr darle indicaciones ms especficas. El tratamiento se ha planificado de acuerdo a las prcticas mdicas actuales, pero a veces pueden ocurrir problemas. Comunquese con el mdico si tiene algn problema o tiene dudas despus del procedimiento. QU ESPERAR DESPUS DEL PROCEDIMIENTO Despus del procedimiento, es normal tener: 9. Dolor o Publishing copy de insercin. 10. Dolor de cabeza leve que se alivia con analgsicos. INSTRUCCIONES PARA EL CUIDADO EN EL HOGAR 2. Evite levantar cualquier objeto que pese ms de 10lb (4,5kg) durante al menos 12horas despus del procedimiento. 3. Beba suficiente lquido para mantener la orina clara o de color  amarillo plido. SOLICITE ATENCIN MDICA SI:  Tiene fiebre o escalofros.  Tiene nuseas o vmitos.  Tiene dolor de cabeza que dura ms de 2das. SOLICITE ATENCIN MDICA DE INMEDIATO SI:  Tiene adormecimiento u hormigueo en las piernas.  Pierde el control de la vejiga o de los intestinos.  Tiene sangrado o hinchazn en la espalda, en el lugar de la insercin.  Se siente mareado o se desmaya. Document Released: 12/28/2012 [facility] Patient Information 2015 [place]. This information is not intended to replace advice given to you by your health care provider. Make sure you discuss any questions you have with your health care provider.

## 2013-11-17 NOTE — Progress Notes (Signed)
He feels well. Denies neck pain. He is ready to proceed with final IT chemo today  Intrathecal administration of chemotherapy Procedure Note   Informed consent was obtained and potential risks including bleeding, infection and pain were reviewed with the patient.  The patient's name, date of birth, identification, consent and allergies were verified prior to the start of procedure and time out was performed.  The skin was prepped with Betadine solution.   2 cc of 1% lidocaine was used to provide local anaesthesia.   The L3/L4 intrathecal space was chosen as the site of procedure.  Clear CSF was obtained.  Next, 5 ml/12 mg methotrexate was administered into the intrathecal space.  The procedure was tolerated well and there were no complications.  The patient was stable at the end of the procedure.  Spent 30 minutes on the procedure today

## 2013-11-17 NOTE — Progress Notes (Signed)
1530-Lumbar puncture and administration of methotrexate complete per Dr. Alvy Bimler without difficulties.  Pt tolerated with no complaints.  Band-aid applied and site clean, dry and intact.  Pt to stay flat on back for 15-20 minutes and then may be discharged to home.  1545-Pt will be traveling to [place] on 11/21-approximately 12/6.  Pt asking if this is OK with Dr. Alvy Bimler.  Dr. Alvy Bimler notified of patient's travel plans and OK for pt to travel.  1615-Band aid clean, dry and intact to lower back.  Pt is without complaints at this time.  Discharge instructions reviewed with pt via interpreter.  Pt has no questions at this time.

## 2013-11-18 ENCOUNTER — Ambulatory Visit (HOSPITAL_BASED_OUTPATIENT_CLINIC_OR_DEPARTMENT_OTHER): Payer: BC Managed Care – PPO

## 2013-11-18 ENCOUNTER — Telehealth: Payer: Self-pay | Admitting: Hematology and Oncology

## 2013-11-18 ENCOUNTER — Ambulatory Visit: Payer: BC Managed Care – PPO

## 2013-11-18 DIAGNOSIS — C8330 Diffuse large B-cell lymphoma, unspecified site: Secondary | ICD-10-CM

## 2013-11-18 DIAGNOSIS — Z5189 Encounter for other specified aftercare: Secondary | ICD-10-CM

## 2013-11-18 MED ORDER — PEGFILGRASTIM INJECTION 6 MG/0.6ML ~~LOC~~
6.0000 mg | PREFILLED_SYRINGE | Freq: Once | SUBCUTANEOUS | Status: AC
  Administered 2013-11-18: 6 mg via SUBCUTANEOUS
  Filled 2013-11-18: qty 0.6

## 2013-11-18 NOTE — Telephone Encounter (Signed)
s.w. pt and interpreter [phone] and adjusted inj for today

## 2013-11-18 NOTE — Patient Instructions (Signed)
Pegfilgrastim injection Qu es este medicamento? El PEGFILGRASTIM es un factor estimulante de colonias de granulocitos de accin prolongada que estimula el crecimiento de los neutrfilos, un tipo de glbulo blanco importante en la lucha del cuerpo contra la infeccin. Se utiliza para reducir la incidencia de la fiebre y la infeccin en pacientes con ciertos tipos de cncer que reciben quimioterapia que afecta la mdula sea. Este medicamento puede ser utilizado para otros usos; si tiene alguna pregunta consulte con su proveedor de atencin mdica o con su farmacutico. MARCAS COMERCIALES DISPONIBLES: Neulasta Qu le debo informar a mi profesional de la salud antes de tomar este medicamento? Necesita saber si usted presenta alguno de los siguientes problemas o situaciones: -alergia al ltex -si actualmente recibe radioterapia -anemia drepanoctica -reacciones cutneas a Therapist, music acrlicos (On-Body Injector solamente, su nombre en ingls) -una reaccin alrgica o inusual al pegfilgrastim, al filgrastim, a otros medicamentos, alimentos, colorantes o conservantes -si est embarazada o buscando quedar embarazada -si est amamantando a un beb Cmo debo utilizar este medicamento? Este medicamento se administra mediante inyeccin por debajo de la piel. Si recibe [employer] en su domicilio, le ensearn cmo preparar y IT sales professional jeringa prellenada o como usar Biomedical scientist en el cuerpo (su nombre en ingls, Academic librarian). Consulte las instrucciones de uso para para obtener instrucciones detalladas. selo exactamente como se le indique. Use su medicamento a intervalos regulares. No use su medicamento con una frecuencia mayor a la indicada. Es importante que deseche las agujas y las jeringas usadas en un recipiente resistente a los pinchazos. No las deseche en una basura. Si no tiene un recipiente resistente a los pinchazos, consulte a Midwife o su proveedor de atencin para  obtenerlo. Hable con su pediatra para informarse acerca del uso de este medicamento en nios. Puede requerir atencin especial. Sobredosis: Pngase en contacto inmediatamente con un centro toxicolgico o una sala de urgencia si usted cree que haya tomado demasiado medicamento. ATENCIN: [employer] es solo para usted. No comparta este medicamento con nadie. Qu sucede si me olvido de una dosis? Es importante no olvidar ninguna dosis. Comunquese con su mdico o profesional de la salud si se olvida una dosis. Si se olvida una dosis debido a un fallo del Journalist, newspaper cuerpo (su nombre en ingls, On-body Injector). o fugas, una nueva dosis debe ser administrada tan pronto como sea posible usando una sola [place] prellenada para uso manual. Qu puede interactuar con este medicamento? No se han estudiado las interacciones. Puede ser que esta lista no menciona todas las posibles interacciones. Informe a su profesional de [employer]	[place] de [employer] productos a base de hierbas, medicamentos de [place] o suplementos nutritivos que est tomando. Si usted fuma, consume bebidas alcohlicas o si utiliza drogas ilegales, indqueselo tambin a su profesional de [employer]	[place]. Algunas sustancias pueden interactuar con su medicamento. A qu debo estar atento al usar [employer]? Usted podr Psychologist, prison and probation services de sangre mientras est usando este medicamento. Si va a someterse a un IRM (MRI), tomografa computarizada u otro procedimiento, informe a su mdico que est usando este medicamento (su nombre en ingls, On-body Injector solamente). Qu efectos secundarios puedo tener al [employer] este medicamento? Efectos secundarios que debe informar a su mdico o a Barrister's clerk de la salud tan pronto como sea posible: -Chief of Staff como erupcin cutnea, picazn o urticarias, hinchazn de la cara, labios o lengua -mareos -fiebre -dolor, enrojecimiento o irritacinen la zona de la  inyeccin -puntos rojos en la  piel -falta de aliento o problemas respiratorios -dolor de estmago, dolor en un costado o dolor en el hombro -hinchazn -cansancio -dificultad para orinar Efectos secundarios que, por lo general, no requieren atencin mdica (debe informarlos a su mdico o a Barrister's clerk de la salud si persisten o si son molestos): -dolor de huesos -dolor de msculos Puede ser que esta lista no menciona todos los posibles efectos secundarios. Comunquese a su mdico por asesoramiento mdico [employer]. Usted puede informar los efectos secundarios a la FDA por telfono al 1-800-FDA-1088. Dnde debo guardar mi medicina? Mantngala fuera del alcance de los nios. Guarde las jeringas prellenadas en un refrigerador, a una temperatura de Guys 2 y 43 grados C (23 y 39 grados F). No las congele. Mantngalas en el cartn para proteger contra la luz. Si este medicamento se queda fuera del refrigerador durante ms de 48 horas, debe desecharlo. Deseche todo el medicamento que no haya utilizado, despus de la fecha de vencimiento. ATENCIN: Este folleto es un resumen. Puede ser que no cubra toda la posible informacin. Si usted tiene preguntas acerca de esta medicina, consulte con su mdico, su farmacutico o su profesional de Technical sales engineer.  2015, Elsevier/Gold Standard. (2013-04-08 12:11:42)

## 2013-11-21 ENCOUNTER — Other Ambulatory Visit: Payer: Self-pay | Admitting: *Deleted

## 2013-11-21 DIAGNOSIS — C8330 Diffuse large B-cell lymphoma, unspecified site: Secondary | ICD-10-CM

## 2013-11-21 MED ORDER — OXYCODONE HCL 10 MG PO TABS: ORAL_TABLET | ORAL | Status: DC

## 2013-11-21 NOTE — Telephone Encounter (Signed)
Dr Alvy Bimler wrote a refill on 10 mg tablets. Pt states the 30 mg tablets are giving him a severe headache and causing nausea. Refusing to take 30 mg tabs- informed family that pharmacy might not refill this soon and he might run out while in [place]. She is giving # 90 tablets to take 3 tablets every 4 hours prn pain

## 2013-12-07 ENCOUNTER — Telehealth: Payer: Self-pay | Admitting: *Deleted

## 2013-12-07 NOTE — Telephone Encounter (Signed)
S/w Daughter, Mickel Baas, via [employer].  She states pt currently in [place].  He is concerned because he is having some dark skin color changes under his nailbeds on fingers and toes. He is not having any pain, but he thinks it looks like bruising on his fingers and toes.   Informed daughter of skin changes including darkening of nail beds can be normal side effect of chemotherapy.  Tell pt to not worry about it as long as it is not painful.  She verbalized understanding.  Reminded her of upcoming appt lab/PET on 12/10 and MD on 12/11.  She verbalized understanding and states pt is aware of these appts and will be back from [place] for the appointments.

## 2013-12-08 ENCOUNTER — Other Ambulatory Visit: Payer: Self-pay | Admitting: *Deleted

## 2013-12-08 ENCOUNTER — Telehealth: Payer: Self-pay | Admitting: *Deleted

## 2013-12-08 ENCOUNTER — Other Ambulatory Visit: Payer: Self-pay | Admitting: Hematology and Oncology

## 2013-12-08 DIAGNOSIS — C8330 Diffuse large B-cell lymphoma, unspecified site: Secondary | ICD-10-CM

## 2013-12-08 MED ORDER — OXYCODONE HCL 30 MG PO TABS: 30.0000 mg | ORAL_TABLET | Freq: Four times a day (QID) | ORAL | Status: DC | PRN

## 2013-12-08 MED ORDER — OXYCODONE HCL 10 MG PO TABS: ORAL_TABLET | ORAL | Status: DC

## 2013-12-08 NOTE — Telephone Encounter (Signed)
Informed dau in law of Rx ready to pick up.

## 2013-12-08 NOTE — Telephone Encounter (Signed)
Daughter in law states that they got rx filled for the 30 mg oxycodone but did not realize it was 30 mg until it was filled.  She says pt insists on having the 10 mg and says he will not take the 30 mg.  Dr. Alvy Bimler suggested pt cut the pills in half but Judson Roch says pt pretty adamant that they made him sick and unwilling to cut them in half.  He only wants the 10 mg.  Dr. Alvy Bimler signed new rx for the 10 mg.  Informed Sarah the new rx ready to pick up.  Instructed her to return the unopened bottle of oxycodone to pharmacy to get the new rx filled.  She verbalized understanding.

## 2013-12-08 NOTE — Telephone Encounter (Signed)
Daughter in law states pt needs refill on his oxycodone.   Pt currently in [place] and his son is traveling to [place] tomorrow and can take Rx to pt..  Pt will back back in [place] next week.

## 2013-12-13 DIAGNOSIS — Z0271 Encounter for disability determination: Secondary | ICD-10-CM

## 2013-12-15 ENCOUNTER — Ambulatory Visit (HOSPITAL_COMMUNITY)
Admission: RE | Admit: 2013-12-15 | Discharge: 2013-12-15 | Disposition: A | Discharge status: Home / Self Care | Payer: BC Managed Care – PPO | Source: Ambulatory Visit | Attending: Hematology and Oncology | Admitting: Hematology and Oncology

## 2013-12-15 ENCOUNTER — Other Ambulatory Visit (HOSPITAL_BASED_OUTPATIENT_CLINIC_OR_DEPARTMENT_OTHER): Payer: BC Managed Care – PPO

## 2013-12-15 ENCOUNTER — Encounter (HOSPITAL_COMMUNITY): Payer: Self-pay

## 2013-12-15 DIAGNOSIS — C770 Secondary and unspecified malignant neoplasm of lymph nodes of head, face and neck: Secondary | ICD-10-CM

## 2013-12-15 DIAGNOSIS — C8330 Diffuse large B-cell lymphoma, unspecified site: Secondary | ICD-10-CM | POA: Diagnosis present

## 2013-12-15 LAB — COMPREHENSIVE METABOLIC PANEL (CC13)
ALT: 32 U/L (ref 0–55)
AST: 22 U/L (ref 5–34)
Albumin: 3.5 g/dL (ref 3.5–5.0)
Alkaline Phosphatase: 81 U/L (ref 40–150)
Anion Gap: 10 mEq/L (ref 3–11)
BUN: 12.9 mg/dL (ref 7.0–26.0)
CALCIUM: 9 mg/dL (ref 8.4–10.4)
CO2: 25 mEq/L (ref 22–29)
Chloride: 108 mEq/L (ref 98–109)
Creatinine: 1.1 mg/dL (ref 0.7–1.3)
EGFR: 72 mL/min/{1.73_m2} — ABNORMAL LOW (ref >90)
Glucose: 95 mg/dl (ref 70–140)
Potassium: 3.9 mEq/L (ref 3.5–5.1)
SODIUM: 143 meq/L (ref 136–145)
TOTAL PROTEIN: 6.8 g/dL (ref 6.4–8.3)
Total Bilirubin: 0.42 mg/dL (ref 0.20–1.20)

## 2013-12-15 LAB — CBC WITH DIFFERENTIAL/PLATELET
BASO%: 0.7 % (ref 0.0–2.0)
Basophils Absolute: 0.1 10*3/uL (ref 0.0–0.1)
EOS%: 3.1 % (ref 0.0–7.0)
Eosinophils Absolute: 0.3 10*3/uL (ref 0.0–0.5)
HCT: 36 % — ABNORMAL LOW (ref 38.4–49.9)
HGB: 12.2 g/dL — ABNORMAL LOW (ref 13.0–17.1)
LYMPH#: 1.8 10*3/uL (ref 0.9–3.3)
LYMPH%: 22.7 % (ref 14.0–49.0)
MCH: 29.7 pg (ref 27.2–33.4)
MCHC: 33.9 g/dL (ref 32.0–36.0)
MCV: 87.6 fL (ref 79.3–98.0)
MONO#: 1.1 10*3/uL — AB (ref 0.1–0.9)
MONO%: 13.2 % (ref 0.0–14.0)
NEUT#: 4.9 10*3/uL (ref 1.5–6.5)
NEUT%: 60.3 % (ref 39.0–75.0)
Platelets: 252 10*3/uL (ref 140–400)
RBC: 4.11 10*6/uL — ABNORMAL LOW (ref 4.20–5.82)
RDW: 16.8 % — AB (ref 11.0–14.6)
WBC: 8.1 10*3/uL (ref 4.0–10.3)

## 2013-12-15 LAB — GLUCOSE, CAPILLARY: Glucose-Capillary: 89 mg/dL (ref 70–99)

## 2013-12-15 MED ORDER — FLUDEOXYGLUCOSE F - 18 (FDG) INJECTION
11.2000 | Freq: Once | INTRAVENOUS | Status: AC | PRN
Start: 2013-12-15 — End: 2013-12-15
  Administered 2013-12-15: 11.2 via INTRAVENOUS

## 2013-12-16 ENCOUNTER — Telehealth: Payer: Self-pay | Admitting: Hematology and Oncology

## 2013-12-16 ENCOUNTER — Ambulatory Visit (HOSPITAL_BASED_OUTPATIENT_CLINIC_OR_DEPARTMENT_OTHER): Payer: BC Managed Care – PPO | Admitting: Hematology and Oncology

## 2013-12-16 VITALS — BP 145/71 | HR 80 | Temp 97.4°F | Resp 20 | Ht 61.0 in | Wt 200.7 lb

## 2013-12-16 DIAGNOSIS — C8330 Diffuse large B-cell lymphoma, unspecified site: Secondary | ICD-10-CM

## 2013-12-16 DIAGNOSIS — IMO0001 Reserved for inherently not codable concepts without codable children: Secondary | ICD-10-CM

## 2013-12-16 DIAGNOSIS — C8331 Diffuse large B-cell lymphoma, lymph nodes of head, face, and neck: Secondary | ICD-10-CM

## 2013-12-16 DIAGNOSIS — R030 Elevated blood-pressure reading, without diagnosis of hypertension: Secondary | ICD-10-CM

## 2013-12-16 DIAGNOSIS — R070 Pain in throat: Secondary | ICD-10-CM

## 2013-12-16 MED ORDER — HYDROCHLOROTHIAZIDE 12.5 MG PO TABS: 12.5000 mg | ORAL_TABLET | Freq: Every day | ORAL | Status: DC

## 2013-12-16 MED ORDER — OXYCODONE HCL 5 MG PO TABS: 5.0000 mg | ORAL_TABLET | Freq: Four times a day (QID) | ORAL | Status: DC | PRN

## 2013-12-16 NOTE — Telephone Encounter (Signed)
Gave avs & cal for Jan. °

## 2013-12-18 NOTE — Assessment & Plan Note (Signed)
He is prescribed a diuretic that would help his leg swelling. The patient is recommended to reduce salt intake.
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
## 2013-12-18 NOTE — Assessment & Plan Note (Signed)
The patient has near complete response to treatment. I recommend consolidation treatment with radiation therapy and will refer him to radiation oncologist for further management. I will see him back a month from now for supportive care.

## 2013-12-21 NOTE — Progress Notes (Signed)
Mr. Paulsen has received:   10/07/2013 - 11/17/2013 Chemotherapy He received 3 cycles of R. CHOP chemotherapy with dose adjustment due to abnormal liver function test along with prophylactic intrathecal chemotherapy with methotrexate     11/15/2013 Imaging CT scan of the neck show no evidence of abscess. Prior lymphadenopathy has reduced in size.    12/15/2013 Imaging PET/CT scan showed near complete response to treatment.     Denies any pain.  Note swelling in the bilateral jaw with redeness.  He denies any pain on swallowing.  Reports "great" appetite.

## 2013-12-23 ENCOUNTER — Ambulatory Visit
Admission: RE | Admit: 2013-12-23 | Discharge: 2013-12-23 | Disposition: A | Discharge status: Home / Self Care | Payer: BLUE CROSS/BLUE SHIELD | Source: Ambulatory Visit | Attending: Radiation Oncology | Admitting: Radiation Oncology

## 2013-12-23 ENCOUNTER — Encounter: Payer: Self-pay | Admitting: Radiation Oncology

## 2013-12-23 ENCOUNTER — Encounter: Payer: Self-pay | Admitting: *Deleted

## 2013-12-23 VITALS — BP 137/84 | HR 71 | Temp 98.1°F | Ht 61.0 in | Wt 204.6 lb

## 2013-12-23 DIAGNOSIS — Z510 Encounter for antineoplastic radiation therapy: Secondary | ICD-10-CM | POA: Insufficient documentation

## 2013-12-23 DIAGNOSIS — C8581 Other specified types of non-Hodgkin lymphoma, lymph nodes of head, face, and neck: Secondary | ICD-10-CM | POA: Insufficient documentation

## 2013-12-23 DIAGNOSIS — Z9221 Personal history of antineoplastic chemotherapy: Secondary | ICD-10-CM | POA: Diagnosis not present

## 2013-12-23 DIAGNOSIS — R634 Abnormal weight loss: Secondary | ICD-10-CM

## 2013-12-23 DIAGNOSIS — C8599 Non-Hodgkin lymphoma, unspecified, extranodal and solid organ sites: Secondary | ICD-10-CM

## 2013-12-23 NOTE — Progress Notes (Signed)
Met with patient and his family during initial consult with Dr. Squire.  Julie, Spanish interpreter, present. 1. Introduced myself as their Navigator, explained my role as a member of the Care Team, provided contact information, encouraged them to contact me with questions/concerns as treatments/procedures begin. 2. Provided New Patient Information packet:  Contact information for physician and navigator  Fall Prevention Patient Safety Plan  [facility] campus map with highlight of [facility] 3. Provided introductory explanation of radiation treatment including SIM planning and fitting of head mask, showed them example of mask.   4. Established password '[place]' for telephone inquiries.  Documented in Demographics. 5. Provided a tour of SIM and Tomo areas, explained treatment and arrival procedures.   They verbalized understanding of information provided.    Rick , RN, BSN, CHPN Head & Neck Oncology Navigator  [facility] [phone]   

## 2013-12-23 NOTE — Progress Notes (Signed)
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 Non-Hodgkin's lymphoma of tonsil 202.81 C85.91      HISTORY OF PRESENT ILLNESS:Norman [name] is a 63 y.o. male who presented with Right neck and facial swelling/ mass. Sore throat. Trouble swallowing.  Weight loss from 204 to 165lb. Also, drenching night sweats.  CT of neck on 09-10-13 showed  Right tonsil mass and bulky cervical nodes. 9-16 biopsy of right tonsil and right neck node showed high grade diffuse large B cell lymphoma. 9-21 bone marrow bx negative.   PET on 09-26-13 showed hypermetabolic activity in the right tonsil and right enlarged, necrotic neck nodes, and equivocal uptake in a small right paratracheal node.  Left neck was negative.  No abdominal pelvic or bone metastases.    After 3 cycles of R-CHOP, with prophylactic intrathecal chemotherapy with MTX, all of which was completed on 11/17/13, PET on 12-15-13 revealed significant partial response. A right level II node, 1cm, has SUV max of 4.9 and is concerning for residual disease.    No more chemotherapy planned. He had abnormal liver function tests during therapy.  He reports no pain.  Nausea during chemotherapy has resolved.  He feels relatively well.  Interpreter is here today, with his son and daughter in law.  PREVIOUS RADIATION THERAPY: No  PAST MEDICAL HISTORY:  has a past medical history of Arthritis; Cataract; Depression; Cancer; Hard of hearing; and Lymphoma.    PAST SURGICAL HISTORY: Past Surgical History  Procedure Laterality Date  . Mass biopsy Right 09/21/2013    Procedure: EXCISIONAL BIOPSY RIGHT NECK NODE;  Surgeon: [name], MD;  Location: [facility];   Service: ENT;  Laterality: Right;  with frozen sections  . Tonsillectomy Right 09/21/2013    Procedure: RIGHT TONSILLECTOMY;  Surgeon: [name], MD;  Location: [facility];  Service: ENT;  Laterality: Right;  . Tonsillectomy      FAMILY HISTORY: family history includes Cancer in his mother; Diabetes in his sister.  SOCIAL HISTORY:  reports that he has never smoked. He has never used smokeless tobacco. He reports that he does not drink alcohol or use illicit drugs.  ALLERGIES: Penicillins  MEDICATIONS:  Current Outpatient Prescriptions  Medication Sig Dispense Refill  . hydrochlorothiazide (HYDRODIURIL) 12.5 MG tablet Take 1 tablet (12.5 mg total) by mouth daily. (Patient not taking: Reported on 12/23/2013) 30 tablet 1  . predniSONE (DELTASONE) 20 MG tablet Take 3 tablets (60 mg total) by mouth daily. Take daily for four days after each cycle of chemotherapy. (Patient not taking: Reported on 12/16/2013) 12 tablet 5   No current facility-administered medications for this encounter.    REVIEW OF SYSTEMS:  Notable for that above.   PHYSICAL EXAM:  height is 5\' 1"  (1.549 m) and weight is 204 lb 9.6 oz (92.806 kg). His temperature is 98.1 F (36.7 C). His blood pressure is 137/84 and his pulse is 71. His oxygen saturation is 98%.   General: Alert and oriented, in no acute distress HEENT: Head is alopecic, normocephalic. Extraocular movements are intact. Oropharynx is clear but there is less palatal elevation on right less. No mass. Teeth in good repair, no excessive metal work. Neck: Neck is thick  and relatively short, no palpable cervical or supraclavicular lymphadenopathy. Heart: Regular in rate and rhythm with no murmurs, rubs, or gallops. Chest: Clear to auscultation bilaterally, with no rhonchi, wheezes, or rales. Abdomen: Soft, nontender, nondistended, with no rigidity or guarding. Extremities: No cyanosis or edema. Lymphatics: see Neck Exam Skin: No concerning  lesions. Musculoskeletal: symmetric strength and muscle tone throughout. Neurologic: Cranial nerves II through XII are grossly intact. No obvious focalities. Speech is fluent. Coordination is intact. Psychiatric: Judgment and insight are intact. Affect is appropriate.    ECOG = 0  0 - Asymptomatic (Fully active, able to carry on all predisease activities without restriction)  1 - Symptomatic but completely ambulatory (Restricted in physically strenuous activity but ambulatory and able to carry out work of a light or sedentary nature. For example, light housework, office work)  2 - Symptomatic, <50% in bed during the day (Ambulatory and capable of all self care but unable to carry out any work activities. Up and about more than 50% of waking hours)  3 - Symptomatic, >50% in bed, but not bedbound (Capable of only limited self-care, confined to bed or chair 50% or more of waking hours)  4 - Bedbound (Completely disabled. Cannot carry on any self-care. Totally confined to bed or chair)  5 - Death   Eustace Pen MM, Creech RH, Tormey DC, et al. [phone]). "Toxicity and response criteria of the [facility]". [place] Oncol. 5 (6): 649-55   LABORATORY DATA:  Lab Results  Component Value Date   WBC 8.1 12/15/2013   HGB 12.2* 12/15/2013   HCT 36.0* 12/15/2013   MCV 87.6 12/15/2013   PLT 252 12/15/2013   CMP     Component Value Date/Time   NA 143 12/15/2013 0807   NA 141 09/30/2013 0630   K 3.9 12/15/2013 0807   K 4.1 09/30/2013 0630   CL 102 09/30/2013 0630   CO2 25 12/15/2013 0807   CO2 29 09/30/2013 0630   GLUCOSE 95 12/15/2013 0807   GLUCOSE 71 09/30/2013 0630   BUN 12.9 12/15/2013 0807   BUN 17 09/30/2013 0630   CREATININE 1.1 12/15/2013 0807   CREATININE 1.00 09/30/2013 0630   CALCIUM 9.0 12/15/2013 0807   CALCIUM 8.8 09/30/2013 0630   PROT 6.8 12/15/2013 0807   PROT 7.1 09/30/2013 0630   ALBUMIN 3.5 12/15/2013 0807   ALBUMIN 2.6* 09/30/2013 0630    AST 22 12/15/2013 0807   AST 40* 09/30/2013 0630   ALT 32 12/15/2013 0807   ALT 109* 09/30/2013 0630   ALKPHOS 81 12/15/2013 0807   ALKPHOS 105 09/30/2013 0630   BILITOT 0.42 12/15/2013 0807   BILITOT 0.3 09/30/2013 0630   GFRNONAA 78* 09/30/2013 0630   GFRAA >90 09/30/2013 0630   No results found for: TSH       RADIOGRAPHY: Nm Pet Image Restag (ps) Skull Base To Thigh  12/15/2013   CLINICAL DATA:  Restaging treatment strategy for lymphoma.  EXAM: NUCLEAR MEDICINE PET SKULL BASE TO THIGH  TECHNIQUE: 11.2 mCi F-18 FDG was injected intravenously. Full-ring PET imaging was performed from the skull base to thigh after the radiotracer. CT data was obtained and used for attenuation correction and anatomic localization.  FASTING BLOOD GLUCOSE:  Value: 89 mg/dl  COMPARISON:  09/24/2013  FINDINGS: NECK  Right level 2 lymph node has an SUV max equal to 4.9 and measures 1 cm. Previously this measured 2.5 cm and had an SUV max equal to 8.2. Large necrotic lymph node  mass in the level 3 and level 4 area has resolved in the interval.  CHEST  No hypermetabolic mediastinal or hilar nodes. No suspicious pulmonary nodules on the CT scan.  ABDOMEN/PELVIS  No abnormal hypermetabolic activity within the liver, pancreas, adrenal glands, or spleen. No hypermetabolic lymph nodes in the abdomen or pelvis. Prostate gland enlargement noted.  SKELETON  No focal hypermetabolic activity to suggest skeletal metastasis. Spondylosis identified within the thoracic and lumbar spine.  IMPRESSION: 1. Interval response to therapy. There has been marked interval resolution of hypermetabolic cervical adenopathy. 2. Single, persistent right level 2 lymph node has an SUV max equal to 4.9 and measures 1 cm and is concerning for residual tumor.   Electronically Signed   By: Kerby Moors M.D.   On: 12/15/2013 11:23      IMPRESSION/PLAN:  Today, I talked to the patient about the findings and work-up and treatment thus far. We discussed  the patient's diagnosis of diffuse large B cell lymphoma of the right tonsil and right neck and general treatment for this, highlighting the role of radiotherapy in the management for local regional control. We discussed the available radiation techniques, and focused on the details of logistics and delivery.    We discussed the risks, benefits, and side effects of radiotherapy. Side effects may include but not necessarily be limited to: taste changes, dry mouth, fatigue, skin irritation, regional hair loss.  No guarantees of treatment were given. A consent form was signed and placed in the patient's medical record. Anticipate 4-5 weeks of involved site radiotherapy.  The patient was encouraged to ask questions that I answered to the best of my ability.   Proceed with simulation next week.  Check baseline TSH due to future neck radiotherapy. __________________________________________   [name], MD

## 2013-12-24 DIAGNOSIS — C8599 Non-Hodgkin lymphoma, unspecified, extranodal and solid organ sites: Secondary | ICD-10-CM | POA: Insufficient documentation

## 2013-12-26 ENCOUNTER — Telehealth: Payer: Self-pay | Admitting: *Deleted

## 2013-12-26 NOTE — Telephone Encounter (Signed)
CALLED PATIENT TO INFORM OF LAB ON 12-28-13, NO ANSWER, MAILED APPT. CARD

## 2013-12-28 ENCOUNTER — Ambulatory Visit
Admission: RE | Admit: 2013-12-28 | Discharge: 2013-12-28 | Disposition: A | Discharge status: Home / Self Care | Payer: BC Managed Care – PPO | Source: Ambulatory Visit | Attending: Radiation Oncology | Admitting: Radiation Oncology

## 2013-12-28 ENCOUNTER — Encounter: Payer: Self-pay | Admitting: *Deleted

## 2013-12-28 ENCOUNTER — Ambulatory Visit
Admission: RE | Admit: 2013-12-28 | Discharge: 2013-12-28 | Disposition: A | Discharge status: Home / Self Care | Payer: BLUE CROSS/BLUE SHIELD | Source: Ambulatory Visit | Attending: Radiation Oncology | Admitting: Radiation Oncology

## 2013-12-28 VITALS — BP 132/79 | HR 79 | Temp 97.7°F | Ht 61.0 in | Wt 203.3 lb

## 2013-12-28 DIAGNOSIS — C8599 Non-Hodgkin lymphoma, unspecified, extranodal and solid organ sites: Secondary | ICD-10-CM

## 2013-12-28 DIAGNOSIS — C770 Secondary and unspecified malignant neoplasm of lymph nodes of head, face and neck: Secondary | ICD-10-CM

## 2013-12-28 DIAGNOSIS — Z510 Encounter for antineoplastic radiation therapy: Secondary | ICD-10-CM | POA: Diagnosis not present

## 2013-12-28 DIAGNOSIS — C8330 Diffuse large B-cell lymphoma, unspecified site: Secondary | ICD-10-CM

## 2013-12-28 DIAGNOSIS — R634 Abnormal weight loss: Secondary | ICD-10-CM

## 2013-12-28 LAB — CBC WITH DIFFERENTIAL/PLATELET
BASO%: 0.5 % (ref 0.0–2.0)
Basophils Absolute: 0 10*3/uL (ref 0.0–0.1)
EOS ABS: 0.3 10*3/uL (ref 0.0–0.5)
EOS%: 3.8 % (ref 0.0–7.0)
HEMATOCRIT: 38.7 % (ref 38.4–49.9)
HGB: 13.2 g/dL (ref 13.0–17.1)
LYMPH%: 24.8 % (ref 14.0–49.0)
MCH: 30.1 pg (ref 27.2–33.4)
MCHC: 34.1 g/dL (ref 32.0–36.0)
MCV: 88.4 fL (ref 79.3–98.0)
MONO#: 0.9 10*3/uL (ref 0.1–0.9)
MONO%: 12.2 % (ref 0.0–14.0)
NEUT#: 4.5 10*3/uL (ref 1.5–6.5)
NEUT%: 58.7 % (ref 39.0–75.0)
PLATELETS: 212 10*3/uL (ref 140–400)
RBC: 4.38 10*6/uL (ref 4.20–5.82)
RDW: 15.6 % — ABNORMAL HIGH (ref 11.0–14.6)
WBC: 7.7 10*3/uL (ref 4.0–10.3)
lymph#: 1.9 10*3/uL (ref 0.9–3.3)

## 2013-12-28 LAB — COMPREHENSIVE METABOLIC PANEL (CC13)
ALT: 31 U/L (ref 0–55)
ANION GAP: 10 meq/L (ref 3–11)
AST: 22 U/L (ref 5–34)
Albumin: 3.7 g/dL (ref 3.5–5.0)
Alkaline Phosphatase: 85 U/L (ref 40–150)
BILIRUBIN TOTAL: 0.48 mg/dL (ref 0.20–1.20)
BUN: 13.1 mg/dL (ref 7.0–26.0)
CO2: 28 meq/L (ref 22–29)
Calcium: 9.3 mg/dL (ref 8.4–10.4)
Chloride: 103 mEq/L (ref 98–109)
Creatinine: 1.2 mg/dL (ref 0.7–1.3)
EGFR: 67 mL/min/{1.73_m2} — AB (ref >90)
GLUCOSE: 171 mg/dL — AB (ref 70–140)
Potassium: 3.9 mEq/L (ref 3.5–5.1)
Sodium: 141 mEq/L (ref 136–145)
Total Protein: 7.2 g/dL (ref 6.4–8.3)

## 2013-12-28 LAB — TSH CHCC: TSH: 2.089 m(IU)/L (ref 0.320–4.118)

## 2013-12-28 MED ORDER — SODIUM CHLORIDE 0.9 % IJ SOLN
10.0000 mL | Freq: Once | INTRAMUSCULAR | Status: AC
  Administered 2013-12-28: 10 mL via INTRAVENOUS

## 2013-12-28 NOTE — Progress Notes (Addendum)
Simulation, IMRT treatment planning note   Outpatient  Diagnosis:    ICD-9-CM ICD-10-CM   1. Non-Hodgkin's lymphoma of tonsil 202.81 C85.91      The patient was taken to the CT simulator and laid in the supine position on the table. An Aquaplast head and shoulder mask was custom fitted to the patient's anatomy. High-resolution CT axial imaging was obtained of the head and neck with contrast. I verified that the quality of the imaging is good for treatment planning. 1 Medically Necessary Treatment Device was fabricated and supervised by me: Aquaplast mask.   Treatment planning note I plan to treat the patient with helical Tomotherapy, IMRT. I plan to treat the patient's prior tumor regions in the oropharynx and right neck. I plan to cover the equivocal right paratracheal nodal region that was mildly PET avid at diagnosis.  I plan to treat to a total dose of 46 Gray in 23  Fractions to high risk areas and 41.4Gy in 23 fractions to intermediate risk areas. Dose calculation was ordered from dosimetry.  IMRT planning Note  IMRT is an important modality to deliver adequate dose to the patient's at risk tissues while sparing the patient's normal structures, including the: esophagus, parotid tissue,  brain stem, spinal cord, oral cavity, lungs, thyroid, mandible/tooth roots.  This justifies the use of IMRT in the patient's treatment.     -----------------------------------  [name], MD

## 2013-12-28 NOTE — Progress Notes (Addendum)
IV inserted at 1100 with a 22 gauge in the right medial forearm.  Brisk blood return and Norman [name] with no voiced concerns.  He verified that he was not a diabetic, nor had ever experienced a negative reaction to contrast media.  Accompanied by family and Spanish Interpreter, Norman [name].

## 2013-12-28 NOTE — Progress Notes (Signed)
To provide support and encouragement, care continuity and to assess for needs, met with patient and his family along with interpreter [facility] during [facility] [place] SIM.  After SIM, showed them Tomo tmt area, explained arrival and preparation procedures.  In Radiation Waiting, explained the monitor and how he is notified to come to tmt area.  I printed an Epic appt calendar to clarify appts.  Patient indicated understanding of information provided, understands he can contact me with questions/concerns.  Gayleen Orem, RN, BSN, [facility] [place] [phone]

## 2013-12-29 DIAGNOSIS — Z9221 Personal history of antineoplastic chemotherapy: Secondary | ICD-10-CM | POA: Diagnosis not present

## 2013-12-29 DIAGNOSIS — C8581 Other specified types of non-Hodgkin lymphoma, lymph nodes of head, face, and neck: Secondary | ICD-10-CM | POA: Diagnosis not present

## 2013-12-29 DIAGNOSIS — Z510 Encounter for antineoplastic radiation therapy: Secondary | ICD-10-CM | POA: Diagnosis present

## 2013-12-29 NOTE — Addendum Note (Signed)
Encounter addended by: [name], MD on: 12/29/2013 10:30 AM<BR>     Documentation filed: Notes Section

## 2014-01-09 DIAGNOSIS — Z510 Encounter for antineoplastic radiation therapy: Secondary | ICD-10-CM | POA: Diagnosis not present

## 2014-01-11 ENCOUNTER — Ambulatory Visit
Admission: RE | Admit: 2014-01-11 | Discharge: 2014-01-11 | Disposition: A | Discharge status: Home / Self Care | Payer: BLUE CROSS/BLUE SHIELD | Source: Ambulatory Visit | Attending: Radiation Oncology | Admitting: Radiation Oncology

## 2014-01-11 DIAGNOSIS — Z510 Encounter for antineoplastic radiation therapy: Secondary | ICD-10-CM | POA: Diagnosis not present

## 2014-01-11 DIAGNOSIS — C8599 Non-Hodgkin lymphoma, unspecified, extranodal and solid organ sites: Secondary | ICD-10-CM

## 2014-01-11 NOTE — Progress Notes (Signed)
IMRT Device Note    ICD-9-CM ICD-10-CM   1. Non-Hodgkin's lymphoma of tonsil 202.81 C85.91     9.0 delivered field widths represent one set of IMRT treatment devices. The code is [phone].  -----------------------------------  [name], MD

## 2014-01-12 ENCOUNTER — Ambulatory Visit
Admission: RE | Admit: 2014-01-12 | Discharge: 2014-01-12 | Disposition: A | Discharge status: Home / Self Care | Payer: BLUE CROSS/BLUE SHIELD | Source: Ambulatory Visit | Attending: Radiation Oncology | Admitting: Radiation Oncology

## 2014-01-12 DIAGNOSIS — Z510 Encounter for antineoplastic radiation therapy: Secondary | ICD-10-CM | POA: Diagnosis not present

## 2014-01-12 DIAGNOSIS — C8599 Non-Hodgkin lymphoma, unspecified, extranodal and solid organ sites: Secondary | ICD-10-CM

## 2014-01-12 MED ORDER — BIAFINE EX EMUL
Freq: Two times a day (BID) | CUTANEOUS | Status: DC
  Administered 2014-01-12: 15:00:00 via TOPICAL

## 2014-01-12 NOTE — Progress Notes (Signed)
Patient education completed with patient; Spanish interpreter, Sula Rumple present. Patient does not speak nor read English, and no one in his home reads [place]. Reviewed temporary side effects from radiation with patient, re: fatigue, hair loss/care, mouth irritation/care, nausea/management, skin irritation/care, throat irritation/management, nutrition, pain. Gave pt Biafine with instructions for proper use. Teach back method used. Patient is scheduled to have interpreter with him every day he receives radiation treatments. Informed him to be sure to let staff know if he has any questions/problems. Patient verbalized understanding.

## 2014-01-13 ENCOUNTER — Encounter: Payer: Self-pay | Admitting: *Deleted

## 2014-01-13 ENCOUNTER — Ambulatory Visit
Admission: RE | Admit: 2014-01-13 | Discharge: 2014-01-13 | Disposition: A | Discharge status: Home / Self Care | Payer: BLUE CROSS/BLUE SHIELD | Source: Ambulatory Visit | Attending: Radiation Oncology | Admitting: Radiation Oncology

## 2014-01-13 DIAGNOSIS — Z510 Encounter for antineoplastic radiation therapy: Secondary | ICD-10-CM | POA: Diagnosis not present

## 2014-01-13 NOTE — Progress Notes (Signed)
Met with patient and interpreter during [place].  He reports: 1. Increased R neck swelling, denies discomfort.  Understands he is seeing Dr. Isidore Moos next Monday and she will evaluate. 2. Is applying Biafine BID; after tmt and before bed. 3. Is tolerating tmts without difficulty.  Gayleen Orem, RN, BSN, [facility] [place] [phone]

## 2014-01-16 ENCOUNTER — Encounter: Payer: Self-pay | Admitting: Hematology and Oncology

## 2014-01-16 ENCOUNTER — Other Ambulatory Visit (HOSPITAL_BASED_OUTPATIENT_CLINIC_OR_DEPARTMENT_OTHER): Payer: BLUE CROSS/BLUE SHIELD

## 2014-01-16 ENCOUNTER — Encounter: Payer: Self-pay | Admitting: *Deleted

## 2014-01-16 ENCOUNTER — Ambulatory Visit
Admission: RE | Admit: 2014-01-16 | Discharge: 2014-01-16 | Disposition: A | Discharge status: Home / Self Care | Payer: BLUE CROSS/BLUE SHIELD | Source: Ambulatory Visit | Attending: Radiation Oncology | Admitting: Radiation Oncology

## 2014-01-16 ENCOUNTER — Encounter: Payer: Self-pay | Admitting: Radiation Oncology

## 2014-01-16 ENCOUNTER — Telehealth: Payer: Self-pay | Admitting: Hematology and Oncology

## 2014-01-16 ENCOUNTER — Ambulatory Visit (HOSPITAL_BASED_OUTPATIENT_CLINIC_OR_DEPARTMENT_OTHER): Payer: BLUE CROSS/BLUE SHIELD | Admitting: Hematology and Oncology

## 2014-01-16 VITALS — BP 178/82 | HR 70 | Temp 98.2°F | Resp 22 | Ht 61.0 in | Wt 206.1 lb

## 2014-01-16 VITALS — BP 176/94 | HR 75 | Temp 98.3°F | Resp 20 | Wt 207.8 lb

## 2014-01-16 DIAGNOSIS — C8330 Diffuse large B-cell lymphoma, unspecified site: Secondary | ICD-10-CM

## 2014-01-16 DIAGNOSIS — C770 Secondary and unspecified malignant neoplasm of lymph nodes of head, face and neck: Secondary | ICD-10-CM

## 2014-01-16 DIAGNOSIS — Z510 Encounter for antineoplastic radiation therapy: Secondary | ICD-10-CM | POA: Diagnosis not present

## 2014-01-16 DIAGNOSIS — R030 Elevated blood-pressure reading, without diagnosis of hypertension: Secondary | ICD-10-CM

## 2014-01-16 DIAGNOSIS — IMO0001 Reserved for inherently not codable concepts without codable children: Secondary | ICD-10-CM

## 2014-01-16 DIAGNOSIS — I10 Essential (primary) hypertension: Secondary | ICD-10-CM

## 2014-01-16 DIAGNOSIS — C8599 Non-Hodgkin lymphoma, unspecified, extranodal and solid organ sites: Secondary | ICD-10-CM

## 2014-01-16 DIAGNOSIS — R070 Pain in throat: Secondary | ICD-10-CM

## 2014-01-16 LAB — COMPREHENSIVE METABOLIC PANEL (CC13)
ALT: 36 U/L (ref 0–55)
ANION GAP: 8 meq/L (ref 3–11)
AST: 25 U/L (ref 5–34)
Albumin: 3.9 g/dL (ref 3.5–5.0)
Alkaline Phosphatase: 90 U/L (ref 40–150)
BUN: 14.9 mg/dL (ref 7.0–26.0)
CHLORIDE: 103 meq/L (ref 98–109)
CO2: 30 mEq/L — ABNORMAL HIGH (ref 22–29)
Calcium: 9.4 mg/dL (ref 8.4–10.4)
Creatinine: 1.1 mg/dL (ref 0.7–1.3)
EGFR: 75 mL/min/{1.73_m2} — ABNORMAL LOW (ref >90)
GLUCOSE: 91 mg/dL (ref 70–140)
Potassium: 3.9 mEq/L (ref 3.5–5.1)
Sodium: 141 mEq/L (ref 136–145)
TOTAL PROTEIN: 7.5 g/dL (ref 6.4–8.3)
Total Bilirubin: 0.43 mg/dL (ref 0.20–1.20)

## 2014-01-16 LAB — CBC WITH DIFFERENTIAL/PLATELET
BASO%: 0.6 % (ref 0.0–2.0)
BASOS ABS: 0 10*3/uL (ref 0.0–0.1)
EOS%: 2.1 % (ref 0.0–7.0)
Eosinophils Absolute: 0.2 10*3/uL (ref 0.0–0.5)
HCT: 41.8 % (ref 38.4–49.9)
HGB: 14 g/dL (ref 13.0–17.1)
LYMPH%: 22.4 % (ref 14.0–49.0)
MCH: 30 pg (ref 27.2–33.4)
MCHC: 33.6 g/dL (ref 32.0–36.0)
MCV: 89.4 fL (ref 79.3–98.0)
MONO#: 1.1 10*3/uL — ABNORMAL HIGH (ref 0.1–0.9)
MONO%: 12.8 % (ref 0.0–14.0)
NEUT#: 5.3 10*3/uL (ref 1.5–6.5)
NEUT%: 62.1 % (ref 39.0–75.0)
Platelets: 248 10*3/uL (ref 140–400)
RBC: 4.68 10*6/uL (ref 4.20–5.82)
RDW: 14.5 % (ref 11.0–14.6)
WBC: 8.6 10*3/uL (ref 4.0–10.3)
lymph#: 1.9 10*3/uL (ref 0.9–3.3)

## 2014-01-16 MED ORDER — HYDROCHLOROTHIAZIDE 25 MG PO TABS: 25.0000 mg | ORAL_TABLET | Freq: Every day | ORAL | Status: DC

## 2014-01-16 MED ORDER — LISINOPRIL 10 MG PO TABS: 10.0000 mg | ORAL_TABLET | Freq: Every day | ORAL | Status: DC

## 2014-01-16 NOTE — Progress Notes (Signed)
Interpreter, Norman [name] present today. Patient denies pain, loss of appetite, loss of tastes. He states he was fatigued on Sat but other days he is walking 1/2 mile. He reports that he has noticed decreased vision out of his right eye x 3 days, and he noticed increased swelling of his right neck x 1 day. Patient is applying Biafine to right neck treatment area. Re: high BP today; pt takes HCTZ nightly; he is on no other BP medications. He denies dizziness, headache.

## 2014-01-16 NOTE — Progress Notes (Signed)
[facility] OFFICE PROGRESS NOTE  Patient Care Team: No Pcp Per Patient as PCP - General (General Practice) [name], MD as Consulting Physician (Hematology and Oncology) [name], MD as Attending Physician (Radiation Oncology) Brooks Sailors, RN as Oncology Nurse Navigator  SUMMARY OF ONCOLOGIC HISTORY: Oncology History   Diffuse large B cell lymphoma   Primary site: Lymphoid Neoplasms   Staging method: AJCC 6th Edition   Clinical: Stage II signed by [name], MD on 09/23/2013  5:06 PM   Summary: Stage II       Diffuse large B cell lymphoma   09/10/2013 Imaging CT scan of the neck confirmed abnormal tonsil region with regional lymphadenopathy   09/13/2013 Procedure Accession: [id number] consult biopsy showed atypical cells   09/21/2013 Surgery He underwent tonsil biopsy and right lymph node excisional biopsy.   09/21/2013 Pathology Results Accession: [id number] biopsy from tonsil confirmed diffuse large B-cell lymphoma   09/24/2013 Imaging PET CT scan confirmed abnormal oropharyngeal mass with regional lymphadenopathy   09/26/2013 Bone Marrow Biopsy Bone marrow biopsy showed no involvement of lymphoma   10/07/2013 - 11/17/2013 Chemotherapy He received 3 cycles of R. CHOP chemotherapy with dose adjustment due to abnormal liver function test along with prophylactic intrathecal chemotherapy with methotrexate   11/15/2013 Imaging CT scan of the neck show no evidence of abscess. Prior lymphadenopathy has reduced in size.   12/15/2013 Imaging PET/CT scan showed near complete response to treatment.   01/11/2014 -  Radiation Therapy The patient received radiation treatment    INTERVAL HISTORY: Please see below for problem oriented charting. He is seen for supportive care visit. He denies recent infection. No further drainage from the neck. He denies any swallowing difficulties.  REVIEW OF SYSTEMS:   Constitutional: Denies fevers, chills or abnormal weight loss Eyes:  Denies blurriness of vision Ears, nose, mouth, throat, and face: Denies mucositis or sore throat Respiratory: Denies cough, dyspnea or wheezes Cardiovascular: Denies palpitation, chest discomfort or lower extremity swelling Gastrointestinal:  Denies nausea, heartburn or change in bowel habits Skin: Denies abnormal skin rashes Lymphatics: Denies new lymphadenopathy or easy bruising Neurological:Denies numbness, tingling or new weaknesses Behavioral/Psych: Mood is stable, no new changes  All other systems were reviewed with the patient and are negative.  I have reviewed the past medical history, past surgical history, social history and family history with the patient and they are unchanged from previous note.  ALLERGIES:  is allergic to penicillins.  MEDICATIONS:  Current Outpatient Prescriptions  Medication Sig Dispense Refill  . emollient (BIAFINE) cream Apply topically 2 (two) times daily.    . hydrochlorothiazide (HYDRODIURIL) 25 MG tablet Take 1 tablet (25 mg total) by mouth daily. 30 tablet 3  . lisinopril (PRINIVIL,ZESTRIL) 10 MG tablet Take 1 tablet (10 mg total) by mouth daily. 30 tablet 3   No current facility-administered medications for this visit.    PHYSICAL EXAMINATION: ECOG PERFORMANCE STATUS: 0 - Asymptomatic  Filed Vitals:   01/16/14 1224  BP: 178/82  Pulse: 70  Temp: 98.2 F (36.8 C)  Resp: 22   Filed Weights   01/16/14 1224  Weight: 206 lb 1.6 oz (93.486 kg)    GENERAL:alert, no distress and comfortable SKIN: skin color, texture, turgor are normal, no rashes or significant lesions EYES: normal, Conjunctiva are pink and non-injected, sclera clear OROPHARYNX:no exudate, no erythema and lips, buccal mucosa, and tongue normal  NECK: supple, thyroid normal size, non-tender, without nodularity LYMPH:  no palpable lymphadenopathy in the cervical, axillary  or inguinal LUNGS: clear to auscultation and percussion with normal breathing effort HEART: regular  rate & rhythm and no murmurs and no lower extremity edema ABDOMEN:abdomen soft, non-tender and normal bowel sounds Musculoskeletal:no cyanosis of digits and no clubbing  NEURO: alert & oriented x 3 with fluent speech, no focal motor/sensory deficits  LABORATORY DATA:  I have reviewed the data as listed    Component Value Date/Time   NA 141 01/16/2014 1206   NA 141 09/30/2013 0630   K 3.9 01/16/2014 1206   K 4.1 09/30/2013 0630   CL 102 09/30/2013 0630   CO2 30* 01/16/2014 1206   CO2 29 09/30/2013 0630   GLUCOSE 91 01/16/2014 1206   GLUCOSE 71 09/30/2013 0630   BUN 14.9 01/16/2014 1206   BUN 17 09/30/2013 0630   CREATININE 1.1 01/16/2014 1206   CREATININE 1.00 09/30/2013 0630   CALCIUM 9.4 01/16/2014 1206   CALCIUM 8.8 09/30/2013 0630   PROT 7.5 01/16/2014 1206   PROT 7.1 09/30/2013 0630   ALBUMIN 3.9 01/16/2014 1206   ALBUMIN 2.6* 09/30/2013 0630   AST 25 01/16/2014 1206   AST 40* 09/30/2013 0630   ALT 36 01/16/2014 1206   ALT 109* 09/30/2013 0630   ALKPHOS 90 01/16/2014 1206   ALKPHOS 105 09/30/2013 0630   BILITOT 0.43 01/16/2014 1206   BILITOT 0.3 09/30/2013 0630   GFRNONAA 78* 09/30/2013 0630   GFRAA >90 09/30/2013 0630    No results found for: SPEP, UPEP  Lab Results  Component Value Date   WBC 8.6 01/16/2014   NEUTROABS 5.3 01/16/2014   HGB 14.0 01/16/2014   HCT 41.8 01/16/2014   MCV 89.4 01/16/2014   PLT 248 01/16/2014      Chemistry      Component Value Date/Time   NA 141 01/16/2014 1206   NA 141 09/30/2013 0630   K 3.9 01/16/2014 1206   K 4.1 09/30/2013 0630   CL 102 09/30/2013 0630   CO2 30* 01/16/2014 1206   CO2 29 09/30/2013 0630   BUN 14.9 01/16/2014 1206   BUN 17 09/30/2013 0630   CREATININE 1.1 01/16/2014 1206   CREATININE 1.00 09/30/2013 0630      Component Value Date/Time   CALCIUM 9.4 01/16/2014 1206   CALCIUM 8.8 09/30/2013 0630   ALKPHOS 90 01/16/2014 1206   ALKPHOS 105 09/30/2013 0630   AST 25 01/16/2014 1206   AST 40*  09/30/2013 0630   ALT 36 01/16/2014 1206   ALT 109* 09/30/2013 0630   BILITOT 0.43 01/16/2014 1206   BILITOT 0.3 09/30/2013 0630      ASSESSMENT & PLAN:  Diffuse large B cell lymphoma Overall, he is tolerating treatment well. He has discontinued all his pain medications. I plan to see him back in March, approximately 4-6 weeks away from his last day of radiation would repeat PET CT scan to reassess his lymphoma.  Essential hypertension His blood pressure is poorly controlled. I recommend increasing hydrochlorothiazide to 25 mg daily in the morning and to start lisinopril 10 mg at nighttime. I will reassess his blood pressure control in his next visit.   Orders Placed This Encounter  Procedures  . NM PET Image Restag (PS) Skull Base To Thigh    Standing Status: Future     Number of Occurrences:      Standing Expiration Date: 03/18/2015    Order Specific Question:  Reason for Exam (SYMPTOM  OR DIAGNOSIS REQUIRED)    Answer:  staging lymphoma    Order  Specific Question:  Preferred imaging location?    Answer:  [facility]  . Lactate dehydrogenase    Standing Status: Future     Number of Occurrences:      Standing Expiration Date: 02/20/2015   All questions were answered. The patient knows to call the clinic with any problems, questions or concerns. No barriers to learning was detected. I spent 15 minutes counseling the patient face to face. The total time spent in the appointment was 20 minutes and more than 50% was on counseling and review of test results     [facility], [place], MD 01/16/2014 1:29 PM

## 2014-01-16 NOTE — Telephone Encounter (Signed)
Gave avs & cal for March. °

## 2014-01-16 NOTE — Assessment & Plan Note (Signed)
Overall, he is tolerating treatment well. He has discontinued all his pain medications. I plan to see him back in March, approximately 4-6 weeks away from his last day of radiation would repeat PET CT scan to reassess his lymphoma.

## 2014-01-16 NOTE — Progress Notes (Addendum)
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  CBCT/MVCT images/Port film x-rays were reviewed.  The chart was checked. Interpreter, Almyra Free present today. Patient denies pain, loss of appetite, loss of tastes. He states he was fatigued on Sat but other days he is walking 1/2 mile. He reports that he has noticed decreased vision out of his right eye x 3 days, and he noticed increased swelling of his right neck x 1 day.  He denies floaters in vision, but had similar blurring before diagnosis was attained. Patient is applying Biafine to right neck treatment area. Re: high BP today; pt takes HCTZ nightly; he is on no other BP medications. He denies dizziness, headache.  Physical Findings:  weight is 207 lb 12.8 oz (94.257 kg). His oral temperature is 98.3 F (36.8 C). His blood pressure is 176/94 and his pulse is 75. His respiration is 20.  mild swelling in upper right neck.  Eyes without irritation.    CBC    Component Value Date/Time   WBC 8.6 01/16/2014 1206   WBC 9.7 09/30/2013 0630   WBC 9.5 09/13/2013 1604   RBC 4.68 01/16/2014 1206   RBC 4.19* 09/30/2013 0630   RBC 5.07 09/13/2013 1604   HGB 14.0 01/16/2014 1206   HGB 12.0* 09/30/2013 0630   HGB 14.9 09/13/2013 1604   HCT 41.8 01/16/2014 1206   HCT 36.0* 09/30/2013 0630   HCT 44.5 09/13/2013 1604   PLT 248 01/16/2014 1206   PLT 346 09/30/2013 0630   MCV 89.4 01/16/2014 1206   MCV 85.9 09/30/2013 0630   MCV 87.8 09/13/2013 1604   MCH 30.0 01/16/2014 1206   MCH 28.6 09/30/2013 0630   MCH 29.5 09/13/2013 1604   MCHC 33.6 01/16/2014 1206   MCHC 33.3 09/30/2013 0630   MCHC 33.5 09/13/2013 1604   RDW 14.5 01/16/2014 1206   RDW 12.8 09/30/2013 0630   LYMPHSABS 1.9 01/16/2014 1206   LYMPHSABS 2.0 09/27/2013 1907   MONOABS 1.1* 01/16/2014 1206    MONOABS 1.2* 09/27/2013 1907   EOSABS 0.2 01/16/2014 1206   EOSABS 0.1 09/27/2013 1907   BASOSABS 0.0 01/16/2014 1206   BASOSABS 0.1 09/27/2013 1907     CMP     Component Value Date/Time   NA 141 01/16/2014 1206   NA 141 09/30/2013 0630   K 3.9 01/16/2014 1206   K 4.1 09/30/2013 0630   CL 102 09/30/2013 0630   CO2 30* 01/16/2014 1206   CO2 29 09/30/2013 0630   GLUCOSE 91 01/16/2014 1206   GLUCOSE 71 09/30/2013 0630   BUN 14.9 01/16/2014 1206   BUN 17 09/30/2013 0630   CREATININE 1.1 01/16/2014 1206   CREATININE 1.00 09/30/2013 0630   CALCIUM 9.4 01/16/2014 1206   CALCIUM 8.8 09/30/2013 0630   PROT 7.5 01/16/2014 1206   PROT 7.1 09/30/2013 0630   ALBUMIN 3.9 01/16/2014 1206   ALBUMIN 2.6* 09/30/2013 0630   AST 25 01/16/2014 1206   AST 40* 09/30/2013 0630   ALT 36 01/16/2014 1206   ALT 109* 09/30/2013 0630   ALKPHOS 90 01/16/2014 1206   ALKPHOS 105 09/30/2013 0630   BILITOT 0.43 01/16/2014 1206   BILITOT 0.3 09/30/2013 0630   GFRNONAA 78* 09/30/2013 0630   GFRAA >90 09/30/2013 0630  Impression:  The patient is tolerating radiotherapy.   Plan:  Continue radiotherapy as planned.  I will refer to ophthalmology to assess vision and rule out ocular lymphoma with slit light exam, given that tonsil lymphoma is a risk factor. -----------------------------------  [name], MD

## 2014-01-16 NOTE — Assessment & Plan Note (Signed)
His blood pressure is poorly controlled. I recommend increasing hydrochlorothiazide to 25 mg daily in the morning and to start lisinopril 10 mg at nighttime. I will reassess his blood pressure control in his next visit.

## 2014-01-17 ENCOUNTER — Ambulatory Visit
Admission: RE | Admit: 2014-01-17 | Discharge: 2014-01-17 | Disposition: A | Discharge status: Home / Self Care | Payer: BLUE CROSS/BLUE SHIELD | Source: Ambulatory Visit | Attending: Radiation Oncology | Admitting: Radiation Oncology

## 2014-01-17 ENCOUNTER — Telehealth: Payer: Self-pay | Admitting: *Deleted

## 2014-01-17 DIAGNOSIS — Z510 Encounter for antineoplastic radiation therapy: Secondary | ICD-10-CM | POA: Diagnosis not present

## 2014-01-17 DIAGNOSIS — C8599 Non-Hodgkin lymphoma, unspecified, extranodal and solid organ sites: Secondary | ICD-10-CM

## 2014-01-17 DIAGNOSIS — F411 Generalized anxiety disorder: Secondary | ICD-10-CM

## 2014-01-17 MED ORDER — LORAZEPAM 0.5 MG PO TABS: ORAL_TABLET | ORAL | Status: DC

## 2014-01-17 NOTE — Progress Notes (Signed)
Patient brought to nursing following radiation treatment today; Almyra Free, interpreter and 2 family members with pt today. Pt states he is feeling " like a panic attack" when he is in the mask for treatment. He is requesting anti anxiety medication. He has not taken any anti anxiety medication in the past. His only med allergy is PCN. Per daughter, pt will be transported to treatments every day, will not be driving. Pt aware Dr Isidore Moos is not in office today, will see Dr Valere Dross.

## 2014-01-17 NOTE — Progress Notes (Signed)
CC: Dr. [name]   Weekly Management Note:  Site: Right tonsil/neck Current Dose:  1000  cGy Projected Dose: 4600  cGy  Narrative: The patient is seen today for routine under treatment assessment. CBCT/MVCT images/port films were reviewed. The chart was reviewed.   He is seen today complaining of anxiety when he is in his mask for treatment.  He is otherwise doing well.  Physical Examination: There were no vitals filed for this visit..  Weight:  .  No change.  Impression: Tolerating radiation therapy well.  I will give him prescription for lorazepam 0.5 mg to take 1-2 sublingual 30 minutes prior to each treatment.  Dispensed #40, no refills.  Plan: Continue radiation therapy as planned.

## 2014-01-17 NOTE — Telephone Encounter (Signed)
CALLED PATIENT TO INFORM OF APPT. WITH OPTHTHALMOLOGIST ON 02-01-14- ARRIVAL TIME - 10 AM WITH DR. Chauncey Fischer, NO ANSWER, MAILED APPT. CARD

## 2014-01-17 NOTE — Progress Notes (Signed)
To provide support and encouragement, care continuity and to assess for needs, met with patient, his dtr, and interpreter Gregary Signs, during Est Pt appt with Dr. Alvy Bimler.   He verbalized: 1. Comfort/tolerance with Tomo tmts/procedure. 2. Fatigue,  I explained that this is an expected SE. 3. Baseline oral intake food/fluids.  I encouraged him to increase hydration during tmt period. He/dtr understand they can contact me with questions/concerns.  Gayleen Orem, RN, BSN, [place] at [place] [phone]

## 2014-01-18 ENCOUNTER — Ambulatory Visit
Admission: RE | Admit: 2014-01-18 | Discharge: 2014-01-18 | Disposition: A | Discharge status: Home / Self Care | Payer: BLUE CROSS/BLUE SHIELD | Source: Ambulatory Visit | Attending: Radiation Oncology | Admitting: Radiation Oncology

## 2014-01-18 DIAGNOSIS — Z510 Encounter for antineoplastic radiation therapy: Secondary | ICD-10-CM | POA: Diagnosis not present

## 2014-01-19 ENCOUNTER — Ambulatory Visit
Admission: RE | Admit: 2014-01-19 | Discharge: 2014-01-19 | Disposition: A | Discharge status: Home / Self Care | Payer: BLUE CROSS/BLUE SHIELD | Source: Ambulatory Visit | Attending: Radiation Oncology | Admitting: Radiation Oncology

## 2014-01-19 DIAGNOSIS — Z510 Encounter for antineoplastic radiation therapy: Secondary | ICD-10-CM | POA: Diagnosis not present

## 2014-01-20 ENCOUNTER — Ambulatory Visit
Admission: RE | Admit: 2014-01-20 | Discharge: 2014-01-20 | Disposition: A | Discharge status: Home / Self Care | Payer: BLUE CROSS/BLUE SHIELD | Source: Ambulatory Visit | Attending: Radiation Oncology | Admitting: Radiation Oncology

## 2014-01-23 ENCOUNTER — Ambulatory Visit
Admission: RE | Admit: 2014-01-23 | Discharge: 2014-01-23 | Disposition: A | Discharge status: Home / Self Care | Payer: BLUE CROSS/BLUE SHIELD | Source: Ambulatory Visit | Attending: Radiation Oncology | Admitting: Radiation Oncology

## 2014-01-23 VITALS — BP 119/70 | HR 84 | Temp 97.7°F | Resp 20 | Wt 205.2 lb

## 2014-01-23 DIAGNOSIS — C8599 Non-Hodgkin lymphoma, unspecified, extranodal and solid organ sites: Secondary | ICD-10-CM

## 2014-01-23 DIAGNOSIS — Z510 Encounter for antineoplastic radiation therapy: Secondary | ICD-10-CM | POA: Diagnosis not present

## 2014-01-23 MED ORDER — LIDOCAINE VISCOUS 2 % MT SOLN: OROMUCOSAL | Status: DC

## 2014-01-23 NOTE — Progress Notes (Signed)
Spanish interpreter with patient today. Pt reports sore throat when eating, swallowing. No signs of thrush on tongue. He reports loss of tastes, loss of appetite, lost 1 lb in past week. He has not seen nutritionist, has no upcoming appt with her. He is applying Biafine lotion to neck area, no skin changes noted at this time. He denies fatigue.

## 2014-01-23 NOTE — Progress Notes (Signed)
Weekly Management Note:  outpatient    ICD-9-CM ICD-10-CM   1. 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  CBCT/MVCT images/Port film x-rays were reviewed.  The chart was checked. Spanish interpreter with patient today. Pt reports sore throat when eating, swallowing. No signs of thrush on tongue. He reports loss of tastes, loss of appetite, lost 1 lb in past week.He is applying Biafine lotion to neck area, no skin changes noted at this time. He denies fatigue.   Physical Findings:  Wt Readings from Last 3 Encounters:  01/23/14 205 lb 3.2 oz (93.078 kg)  01/16/14 206 lb 1.6 oz (93.486 kg)  01/16/14 207 lb 12.8 oz (94.257 kg)    weight is 205 lb 3.2 oz (93.078 kg). His oral temperature is 97.7 F (36.5 C). His blood pressure is 119/70 and his pulse is 84. His respiration is 20.  erythematous mucosa, no thrush, no skin changes.   CBC    Component Value Date/Time   WBC 8.6 01/16/2014 1206   WBC 9.7 09/30/2013 0630   WBC 9.5 09/13/2013 1604   RBC 4.68 01/16/2014 1206   RBC 4.19* 09/30/2013 0630   RBC 5.07 09/13/2013 1604   HGB 14.0 01/16/2014 1206   HGB 12.0* 09/30/2013 0630   HGB 14.9 09/13/2013 1604   HCT 41.8 01/16/2014 1206   HCT 36.0* 09/30/2013 0630   HCT 44.5 09/13/2013 1604   PLT 248 01/16/2014 1206   PLT 346 09/30/2013 0630   MCV 89.4 01/16/2014 1206   MCV 85.9 09/30/2013 0630   MCV 87.8 09/13/2013 1604   MCH 30.0 01/16/2014 1206   MCH 28.6 09/30/2013 0630   MCH 29.5 09/13/2013 1604   MCHC 33.6 01/16/2014 1206   MCHC 33.3 09/30/2013 0630   MCHC 33.5 09/13/2013 1604   RDW 14.5 01/16/2014 1206   RDW 12.8 09/30/2013 0630   LYMPHSABS 1.9 01/16/2014 1206   LYMPHSABS 2.0 09/27/2013 1907   MONOABS 1.1* 01/16/2014 1206   MONOABS 1.2* 09/27/2013 1907   EOSABS 0.2 01/16/2014 1206   EOSABS 0.1 09/27/2013  1907   BASOSABS 0.0 01/16/2014 1206   BASOSABS 0.1 09/27/2013 1907     CMP     Component Value Date/Time   NA 141 01/16/2014 1206   NA 141 09/30/2013 0630   K 3.9 01/16/2014 1206   K 4.1 09/30/2013 0630   CL 102 09/30/2013 0630   CO2 30* 01/16/2014 1206   CO2 29 09/30/2013 0630   GLUCOSE 91 01/16/2014 1206   GLUCOSE 71 09/30/2013 0630   BUN 14.9 01/16/2014 1206   BUN 17 09/30/2013 0630   CREATININE 1.1 01/16/2014 1206   CREATININE 1.00 09/30/2013 0630   CALCIUM 9.4 01/16/2014 1206   CALCIUM 8.8 09/30/2013 0630   PROT 7.5 01/16/2014 1206   PROT 7.1 09/30/2013 0630   ALBUMIN 3.9 01/16/2014 1206   ALBUMIN 2.6* 09/30/2013 0630   AST 25 01/16/2014 1206   AST 40* 09/30/2013 0630   ALT 36 01/16/2014 1206   ALT 109* 09/30/2013 0630   ALKPHOS 90 01/16/2014 1206   ALKPHOS 105 09/30/2013 0630   BILITOT 0.43 01/16/2014 1206   BILITOT 0.3 09/30/2013 0630   GFRNONAA 78* 09/30/2013 0630   GFRAA >90 09/30/2013 0630     Impression:  The patient is tolerating radiotherapy.   Plan:  Continue radiotherapy as planned.  Lidocaine for sore throat - instructed on how to use this.  He has not seen nutritionist, has no upcoming appt with her.  Will refer if weight loss trend begins.  -----------------------------------  [name], MD

## 2014-01-24 ENCOUNTER — Ambulatory Visit
Admission: RE | Admit: 2014-01-24 | Discharge: 2014-01-24 | Disposition: A | Discharge status: Home / Self Care | Payer: BLUE CROSS/BLUE SHIELD | Source: Ambulatory Visit | Attending: Radiation Oncology | Admitting: Radiation Oncology

## 2014-01-24 DIAGNOSIS — Z510 Encounter for antineoplastic radiation therapy: Secondary | ICD-10-CM | POA: Diagnosis not present

## 2014-01-25 ENCOUNTER — Ambulatory Visit
Admission: RE | Admit: 2014-01-25 | Discharge: 2014-01-25 | Disposition: A | Discharge status: Home / Self Care | Payer: BLUE CROSS/BLUE SHIELD | Source: Ambulatory Visit | Attending: Radiation Oncology | Admitting: Radiation Oncology

## 2014-01-25 DIAGNOSIS — Z510 Encounter for antineoplastic radiation therapy: Secondary | ICD-10-CM | POA: Diagnosis not present

## 2014-01-26 ENCOUNTER — Encounter: Payer: Self-pay | Admitting: *Deleted

## 2014-01-26 ENCOUNTER — Ambulatory Visit
Admission: RE | Admit: 2014-01-26 | Discharge: 2014-01-26 | Disposition: A | Discharge status: Home / Self Care | Payer: BLUE CROSS/BLUE SHIELD | Source: Ambulatory Visit | Attending: Radiation Oncology | Admitting: Radiation Oncology

## 2014-01-26 DIAGNOSIS — Z510 Encounter for antineoplastic radiation therapy: Secondary | ICD-10-CM | POA: Diagnosis not present

## 2014-01-27 ENCOUNTER — Ambulatory Visit: Payer: BLUE CROSS/BLUE SHIELD

## 2014-01-27 NOTE — Progress Notes (Signed)
Spoke briefly with patient after Tomo tmt.  Interpretor Gregary Signs Sowell present. He reported is doing well with tmts, denied any needs/concerns. He understands I can be contacted.  Gayleen Orem, RN, BSN, [place] at [place] [phone]

## 2014-01-30 ENCOUNTER — Ambulatory Visit
Admission: RE | Admit: 2014-01-30 | Discharge: 2014-01-30 | Disposition: A | Discharge status: Home / Self Care | Payer: BLUE CROSS/BLUE SHIELD | Source: Ambulatory Visit | Attending: Radiation Oncology | Admitting: Radiation Oncology

## 2014-01-30 ENCOUNTER — Encounter: Payer: Self-pay | Admitting: Radiation Oncology

## 2014-01-30 VITALS — BP 132/76 | HR 90 | Temp 98.0°F | Resp 16 | Wt 204.2 lb

## 2014-01-30 DIAGNOSIS — Z510 Encounter for antineoplastic radiation therapy: Secondary | ICD-10-CM | POA: Diagnosis not present

## 2014-01-30 DIAGNOSIS — C8599 Non-Hodgkin lymphoma, unspecified, extranodal and solid organ sites: Secondary | ICD-10-CM

## 2014-01-30 MED ORDER — FLUCONAZOLE 100 MG PO TABS: ORAL_TABLET | ORAL | Status: DC

## 2014-01-30 NOTE — Progress Notes (Addendum)
Difficult to assess patient. Daughter acting as interpretor while on the phone. Weight and vitals stable. Reports pain in throat 5 on a scale of 0-10. Reports increased pain with swallowing. Continue to eat soft foods by mouth. Reports thick rope like saliva and dry mouth. Reports using biafine bid as directed. Reports a productive cough with green blood tinged sputum and congestion since last Saturday.

## 2014-01-30 NOTE — Progress Notes (Signed)
Weekly Management Note:  outpatient    ICD-9-CM ICD-10-CM   1. 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  CBCT/MVCT images/Port film x-rays were reviewed.  The chart was checked. Weight and vitals stable. Reports pain in mouth, throat 5 on a scale of 0-10. Reports increased pain with swallowing. Continue to eat soft foods by mouth. Reports thick rope like saliva and dry mouth. Reports using biafine bid as directed. Reports a productive cough with green blood tinged sputum and congestion since last Saturday.  Not yet using Lidocaine.  Taking Oxycodone.   Physical Findings:  Wt Readings from Last 3 Encounters:  01/23/14 205 lb 3.2 oz (93.078 kg)  01/16/14 206 lb 1.6 oz (93.486 kg)  01/16/14 207 lb 12.8 oz (94.257 kg)    weight is 204 lb 3.2 oz (92.625 kg). His oral temperature is 98 F (36.7 C). His blood pressure is 132/76 and his pulse is 90. His respiration is 16 and oxygen saturation is 98%.  erythematous mucosa,  +thrush, no skin changes.   CBC    Component Value Date/Time   WBC 8.6 01/16/2014 1206   WBC 9.7 09/30/2013 0630   WBC 9.5 09/13/2013 1604   RBC 4.68 01/16/2014 1206   RBC 4.19* 09/30/2013 0630   RBC 5.07 09/13/2013 1604   HGB 14.0 01/16/2014 1206   HGB 12.0* 09/30/2013 0630   HGB 14.9 09/13/2013 1604   HCT 41.8 01/16/2014 1206   HCT 36.0* 09/30/2013 0630   HCT 44.5 09/13/2013 1604   PLT 248 01/16/2014 1206   PLT 346 09/30/2013 0630   MCV 89.4 01/16/2014 1206   MCV 85.9 09/30/2013 0630   MCV 87.8 09/13/2013 1604   MCH 30.0 01/16/2014 1206   MCH 28.6 09/30/2013 0630   MCH 29.5 09/13/2013 1604   MCHC 33.6 01/16/2014 1206   MCHC 33.3 09/30/2013 0630   MCHC 33.5 09/13/2013 1604   RDW 14.5 01/16/2014 1206   RDW 12.8 09/30/2013 0630   LYMPHSABS 1.9 01/16/2014 1206   LYMPHSABS 2.0 09/27/2013 1907     MONOABS 1.1* 01/16/2014 1206   MONOABS 1.2* 09/27/2013 1907   EOSABS 0.2 01/16/2014 1206   EOSABS 0.1 09/27/2013 1907   BASOSABS 0.0 01/16/2014 1206   BASOSABS 0.1 09/27/2013 1907     CMP     Component Value Date/Time   NA 141 01/16/2014 1206   NA 141 09/30/2013 0630   K 3.9 01/16/2014 1206   K 4.1 09/30/2013 0630   CL 102 09/30/2013 0630   CO2 30* 01/16/2014 1206   CO2 29 09/30/2013 0630   GLUCOSE 91 01/16/2014 1206   GLUCOSE 71 09/30/2013 0630   BUN 14.9 01/16/2014 1206   BUN 17 09/30/2013 0630   CREATININE 1.1 01/16/2014 1206   CREATININE 1.00 09/30/2013 0630   CALCIUM 9.4 01/16/2014 1206   CALCIUM 8.8 09/30/2013 0630   PROT 7.5 01/16/2014 1206   PROT 7.1 09/30/2013 0630   ALBUMIN 3.9 01/16/2014 1206   ALBUMIN 2.6* 09/30/2013 0630   AST 25 01/16/2014 1206   AST 40* 09/30/2013 0630   ALT 36 01/16/2014 1206   ALT 109* 09/30/2013 0630   ALKPHOS 90 01/16/2014 1206   ALKPHOS 105 09/30/2013 0630   BILITOT 0.43 01/16/2014 1206   BILITOT 0.3 09/30/2013 0630   GFRNONAA 78* 09/30/2013 0630   GFRAA >90 09/30/2013  0630     Impression:  The patient is tolerating radiotherapy. Thrush and cold-like sx.  Plan:  Continue radiotherapy as planned.   Lidocaine for sore throat - re-instructed on how to use this.  He has not seen nutritionist, has no upcoming appt with her.  Interested in more samples of supplements -- I'll inform her.  Diflucan for thrush.  -----------------------------------  [name], MD

## 2014-01-31 ENCOUNTER — Ambulatory Visit
Admission: RE | Admit: 2014-01-31 | Discharge: 2014-01-31 | Disposition: A | Discharge status: Home / Self Care | Payer: BLUE CROSS/BLUE SHIELD | Source: Ambulatory Visit | Attending: Radiation Oncology | Admitting: Radiation Oncology

## 2014-01-31 ENCOUNTER — Encounter: Payer: Self-pay | Admitting: Nutrition

## 2014-01-31 DIAGNOSIS — Z510 Encounter for antineoplastic radiation therapy: Secondary | ICD-10-CM | POA: Diagnosis not present

## 2014-01-31 NOTE — Progress Notes (Unsigned)
Provided second complimentary case of Ensure Plus.  Left with radiation therapists to give to patient on Wednesday, January 27, when he comes for treatment.

## 2014-02-01 ENCOUNTER — Ambulatory Visit
Admission: RE | Admit: 2014-02-01 | Discharge: 2014-02-01 | Disposition: A | Discharge status: Home / Self Care | Payer: BLUE CROSS/BLUE SHIELD | Source: Ambulatory Visit | Attending: Radiation Oncology | Admitting: Radiation Oncology

## 2014-02-01 DIAGNOSIS — Z510 Encounter for antineoplastic radiation therapy: Secondary | ICD-10-CM | POA: Diagnosis not present

## 2014-02-02 ENCOUNTER — Ambulatory Visit
Admission: RE | Admit: 2014-02-02 | Discharge: 2014-02-02 | Disposition: A | Discharge status: Home / Self Care | Payer: BLUE CROSS/BLUE SHIELD | Source: Ambulatory Visit | Attending: Radiation Oncology | Admitting: Radiation Oncology

## 2014-02-02 ENCOUNTER — Encounter: Payer: Self-pay | Admitting: *Deleted

## 2014-02-02 DIAGNOSIS — Z510 Encounter for antineoplastic radiation therapy: Secondary | ICD-10-CM | POA: Diagnosis not present

## 2014-02-02 NOTE — Progress Notes (Signed)
To provide support and encouragement, care continuity and to assess for needs, met with patient during Tomo.  Interpreter present.  He reported throat pain has resolved since taking diflucan, has not felt need to use lidocaine.  He reported dry throat made it difficult to swallow during tmt.  We discussed strategies to mitigate dryness prior to tmt, including throat lozenge, sips of water.   He denied additional needs/concerns, understands he can contact me.  Gayleen Orem, RN, BSN, [place] at [place] [phone]

## 2014-02-03 ENCOUNTER — Ambulatory Visit
Admission: RE | Admit: 2014-02-03 | Discharge: 2014-02-03 | Disposition: A | Discharge status: Home / Self Care | Payer: BLUE CROSS/BLUE SHIELD | Source: Ambulatory Visit | Attending: Radiation Oncology | Admitting: Radiation Oncology

## 2014-02-03 DIAGNOSIS — Z510 Encounter for antineoplastic radiation therapy: Secondary | ICD-10-CM | POA: Diagnosis not present

## 2014-02-06 ENCOUNTER — Ambulatory Visit
Admission: RE | Admit: 2014-02-06 | Discharge: 2014-02-06 | Disposition: A | Discharge status: Home / Self Care | Payer: BLUE CROSS/BLUE SHIELD | Source: Ambulatory Visit | Attending: Radiation Oncology | Admitting: Radiation Oncology

## 2014-02-06 DIAGNOSIS — Z510 Encounter for antineoplastic radiation therapy: Secondary | ICD-10-CM | POA: Diagnosis not present

## 2014-02-07 ENCOUNTER — Ambulatory Visit
Admission: RE | Admit: 2014-02-07 | Discharge: 2014-02-07 | Disposition: A | Discharge status: Home / Self Care | Payer: BLUE CROSS/BLUE SHIELD | Source: Ambulatory Visit | Attending: Radiation Oncology | Admitting: Radiation Oncology

## 2014-02-07 DIAGNOSIS — Z510 Encounter for antineoplastic radiation therapy: Secondary | ICD-10-CM | POA: Diagnosis not present

## 2014-02-08 ENCOUNTER — Encounter: Payer: Self-pay | Admitting: Radiation Oncology

## 2014-02-08 ENCOUNTER — Ambulatory Visit
Admission: RE | Admit: 2014-02-08 | Discharge: 2014-02-08 | Disposition: A | Discharge status: Home / Self Care | Payer: BLUE CROSS/BLUE SHIELD | Source: Ambulatory Visit | Attending: Radiation Oncology | Admitting: Radiation Oncology

## 2014-02-08 VITALS — BP 111/71 | HR 98 | Temp 98.1°F | Resp 20 | Wt 200.4 lb

## 2014-02-08 DIAGNOSIS — C8330 Diffuse large B-cell lymphoma, unspecified site: Secondary | ICD-10-CM

## 2014-02-08 DIAGNOSIS — C8599 Non-Hodgkin lymphoma, unspecified, extranodal and solid organ sites: Secondary | ICD-10-CM

## 2014-02-08 DIAGNOSIS — IMO0001 Reserved for inherently not codable concepts without codable children: Secondary | ICD-10-CM

## 2014-02-08 DIAGNOSIS — Z510 Encounter for antineoplastic radiation therapy: Secondary | ICD-10-CM | POA: Diagnosis not present

## 2014-02-08 DIAGNOSIS — R030 Elevated blood-pressure reading, without diagnosis of hypertension: Secondary | ICD-10-CM

## 2014-02-08 DIAGNOSIS — R070 Pain in throat: Secondary | ICD-10-CM

## 2014-02-08 MED ORDER — SUCRALFATE 1 G PO TABS: ORAL_TABLET | ORAL | Status: DC

## 2014-02-08 MED ORDER — OXYCODONE HCL 5 MG PO TABS: 5.0000 mg | ORAL_TABLET | Freq: Four times a day (QID) | ORAL | Status: DC | PRN

## 2014-02-08 NOTE — Progress Notes (Signed)
Interpreter and 2 family members with patient. Patient reports pain in throat 5/10. He takes Oxycodone but not daily, requesting refill. Pt has not completed Fluconazole prescription; advised he complete this medication. Pt eating soft foods, drinks 3-4 nutritional supplements daily. He has lost 3 lbs in past week. He states his tastes "are off".  He is applying Biafine to neck treatment area for darkening of skin; no desquamation noted today. Pt is somewhat fatigued, states his energy is "50/50".

## 2014-02-08 NOTE — Progress Notes (Signed)
Weekly Management Note:  outpatient    ICD-9-CM ICD-10-CM   1. 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  CBCT/MVCT images/Port film x-rays were reviewed.  The chart was checked. Interpreter and 2 family members with patient. Patient reports pain in throat 5/10. Throat is dry. Lidocaine hard to tolerate. He takes Oxycodone but not daily, requesting refill. Pt has not completed Fluconazole prescription; advised he complete this medication. Pt eating soft foods, drinks 3-4 nutritional supplements daily. He has lost 3 lbs in past week. He states his tastes "are off".  He is applying Biafine to neck treatment area for darkening of skin; no desquamation noted today. Pt is somewhat fatigued, states his energy is "50/50".     Physical Findings:   Vitals with Age-Percentiles 02/08/2014 01/30/2014 01/23/2014  Length     Systolic 102 585 277  Diastolic 71 76 70  Pulse 98 90 84  Respiration 20 16 20   Weight 90.901 kg 92.625 kg 93.078 kg  BMI     VISIT REPORT        weight is 200 lb 6.4 oz (90.901 kg). His oral temperature is 98.1 F (36.7 C). His blood pressure is 111/71 and his pulse is 98. His respiration is 20.    Skin is hyperpigmented.  Erythema, but no mucositis. No thrush.   CBC    Component Value Date/Time   WBC 8.6 01/16/2014 1206   WBC 9.7 09/30/2013 0630   WBC 9.5 09/13/2013 1604   RBC 4.68 01/16/2014 1206   RBC 4.19* 09/30/2013 0630   RBC 5.07 09/13/2013 1604   HGB 14.0 01/16/2014 1206   HGB 12.0* 09/30/2013 0630   HGB 14.9 09/13/2013 1604   HCT 41.8 01/16/2014 1206   HCT 36.0* 09/30/2013 0630   HCT 44.5 09/13/2013 1604   PLT 248 01/16/2014 1206   PLT 346 09/30/2013 0630   MCV 89.4 01/16/2014 1206   MCV 85.9 09/30/2013 0630   MCV 87.8 09/13/2013 1604   MCH 30.0 01/16/2014 1206   MCH 28.6 09/30/2013 0630   MCH 29.5 09/13/2013 1604   MCHC  33.6 01/16/2014 1206   MCHC 33.3 09/30/2013 0630   MCHC 33.5 09/13/2013 1604   RDW 14.5 01/16/2014 1206   RDW 12.8 09/30/2013 0630   LYMPHSABS 1.9 01/16/2014 1206   LYMPHSABS 2.0 09/27/2013 1907   MONOABS 1.1* 01/16/2014 1206   MONOABS 1.2* 09/27/2013 1907   EOSABS 0.2 01/16/2014 1206   EOSABS 0.1 09/27/2013 1907   BASOSABS 0.0 01/16/2014 1206   BASOSABS 0.1 09/27/2013 1907     CMP     Component Value Date/Time   NA 141 01/16/2014 1206   NA 141 09/30/2013 0630   K 3.9 01/16/2014 1206   K 4.1 09/30/2013 0630   CL 102 09/30/2013 0630   CO2 30* 01/16/2014 1206   CO2 29 09/30/2013 0630   GLUCOSE 91 01/16/2014 1206   GLUCOSE 71 09/30/2013 0630   BUN 14.9 01/16/2014 1206   BUN 17 09/30/2013 0630   CREATININE 1.1 01/16/2014 1206   CREATININE 1.00 09/30/2013 0630   CALCIUM 9.4 01/16/2014 1206   CALCIUM 8.8 09/30/2013 0630   PROT 7.5 01/16/2014 1206   PROT 7.1 09/30/2013 0630   ALBUMIN 3.9 01/16/2014 1206   ALBUMIN 2.6* 09/30/2013 0630   AST 25 01/16/2014 1206   AST 40* 09/30/2013 0630   ALT  36 01/16/2014 1206   ALT 109* 09/30/2013 0630   ALKPHOS 90 01/16/2014 1206   ALKPHOS 105 09/30/2013 0630   BILITOT 0.43 01/16/2014 1206   BILITOT 0.3 09/30/2013 0630   GFRNONAA 78* 09/30/2013 0630   GFRAA >90 09/30/2013 0630     Impression:  The patient is tolerating radiotherapy. Oxycodone refilled. Sucralfate for odynophagia/dry throat. -----------------------------------  [name], MD

## 2014-02-09 ENCOUNTER — Ambulatory Visit
Admission: RE | Admit: 2014-02-09 | Discharge: 2014-02-09 | Disposition: A | Discharge status: Home / Self Care | Payer: BLUE CROSS/BLUE SHIELD | Source: Ambulatory Visit | Attending: Radiation Oncology | Admitting: Radiation Oncology

## 2014-02-09 DIAGNOSIS — Z510 Encounter for antineoplastic radiation therapy: Secondary | ICD-10-CM | POA: Diagnosis not present

## 2014-02-10 ENCOUNTER — Ambulatory Visit: Payer: BLUE CROSS/BLUE SHIELD

## 2014-02-10 ENCOUNTER — Ambulatory Visit
Admission: RE | Admit: 2014-02-10 | Discharge: 2014-02-10 | Disposition: A | Discharge status: Home / Self Care | Payer: BLUE CROSS/BLUE SHIELD | Source: Ambulatory Visit | Attending: Radiation Oncology | Admitting: Radiation Oncology

## 2014-02-10 DIAGNOSIS — Z510 Encounter for antineoplastic radiation therapy: Secondary | ICD-10-CM | POA: Diagnosis not present

## 2014-02-13 ENCOUNTER — Ambulatory Visit: Payer: BLUE CROSS/BLUE SHIELD | Admitting: Radiation Oncology

## 2014-02-13 ENCOUNTER — Ambulatory Visit: Payer: BLUE CROSS/BLUE SHIELD

## 2014-02-14 ENCOUNTER — Ambulatory Visit
Admission: RE | Admit: 2014-02-14 | Discharge: 2014-02-14 | Disposition: A | Discharge status: Home / Self Care | Payer: BLUE CROSS/BLUE SHIELD | Source: Ambulatory Visit | Attending: Radiation Oncology | Admitting: Radiation Oncology

## 2014-02-14 ENCOUNTER — Encounter: Payer: Self-pay | Admitting: Radiation Oncology

## 2014-02-14 ENCOUNTER — Encounter: Payer: Self-pay | Admitting: *Deleted

## 2014-02-14 VITALS — BP 115/69 | HR 78 | Resp 16 | Wt 194.8 lb

## 2014-02-14 DIAGNOSIS — Z510 Encounter for antineoplastic radiation therapy: Secondary | ICD-10-CM | POA: Diagnosis not present

## 2014-02-14 DIAGNOSIS — C8599 Non-Hodgkin lymphoma, unspecified, extranodal and solid organ sites: Secondary | ICD-10-CM

## 2014-02-14 NOTE — Progress Notes (Signed)
  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  He was referred to nutrition and given education with samples of nutritional shakes.  Plan: The patient has completed radiation treatment. The patient will return to radiation oncology clinic for routine followup in less than one month. I advised them to call or return sooner if they have any questions or concerns related to their recovery or treatment.  -----------------------------------  [name], MD

## 2014-02-14 NOTE — Progress Notes (Addendum)
Reports pain associated with swallowing. Hyperpigmentation with dry desquamation of anterior neck noted. Reports using Biafine bid as directed. Encouraged to continue biafine bid for the next two weeks. Denies dry mouth or thick rope like saliva. Vitals stable. Patient has lost six pounds since last PUT visit. Patric Dykes, RN called Norman [name] reference supplements. 2 week follow up appointment card given.

## 2014-02-14 NOTE — Progress Notes (Signed)
Weekly Management Note:  Site: Right oropharynx/neck Current Dose:  4600  cGy Projected Dose: 4600  cGy  Narrative: The patient is seen today for routine under treatment assessment. CBCT/MVCT images/port films were reviewed. The chart was reviewed.   The patient finishes today.  He continues to have pain on swallowing for which she takes 5 mg oxycodone IR when necessary.  He tells me that he does get a headache when he takes 2 at one time.  He does have thickening of his saliva.  His by mouth intake is diminished and he does take a couple of cans of Ensure a day.  His weight is down almost 5 pounds over the past week.  Physical Examination:  Filed Vitals:   02/14/14 1634  BP: 115/69  Pulse: 78  Resp: 16  .  Weight: 194 lb 12.8 oz (88.361 kg).  There is hyperpigmentation of the skin, particularly on the right neck with patchy dry desquamation but no areas of moist desquamation.  There is no palpable adenopathy.  Oral cavity and oropharynx is unremarkable to inspection except for mild mucositis and some thickening of his saliva.  No evidence for candidiasis.  Impression: Radiation therapy reasonably well tolerated.  His treatment is completed.  Moderate mucositis which is responding to oxycodone when necessary.  I told him he may avoid headaches by taking one tablet every 4 hours as needed.  We will contact Ernestene Kiel RD from nutrition regarding additional diet supplementation.  I told him he needs to take at least 6 cans a day if he is not eating anything else.  Plan: Follow-up visit with Dr. Isidore Moos in 2 weeks to monitor his weight.

## 2014-02-15 NOTE — Progress Notes (Signed)
Met with pt after final RT to celebrate end of radiation treatment.  He was accompanied by his wife and dtr.  I explained that my role as navigator will continue for several more months and that I will be calling and/or joining them during follow-up visits.  Dtr interpreted, patient indicated understanding.  Gayleen Orem, RN, BSN, [place] at [place] [phone]

## 2014-02-28 ENCOUNTER — Ambulatory Visit: Payer: BLUE CROSS/BLUE SHIELD | Admitting: Radiation Oncology

## 2014-03-02 ENCOUNTER — Encounter: Payer: Self-pay | Admitting: *Deleted

## 2014-03-10 ENCOUNTER — Encounter: Payer: Self-pay | Admitting: Radiation Oncology

## 2014-03-10 ENCOUNTER — Ambulatory Visit
Admission: RE | Admit: 2014-03-10 | Discharge: 2014-03-10 | Disposition: A | Discharge status: Home / Self Care | Payer: BLUE CROSS/BLUE SHIELD | Source: Ambulatory Visit | Attending: Radiation Oncology | Admitting: Radiation Oncology

## 2014-03-10 VITALS — BP 148/77 | HR 85 | Temp 98.7°F | Resp 20 | Ht 61.0 in | Wt 196.5 lb

## 2014-03-10 DIAGNOSIS — C8599 Non-Hodgkin lymphoma, unspecified, extranodal and solid organ sites: Secondary | ICD-10-CM

## 2014-03-10 NOTE — Progress Notes (Signed)
Norman [name] here for follow up with his wife, daughter and grandson.  He denies pain but does report that his back teeth on the right side are painful and sensitive when eating.  He reports he is eating small amount of solids and liquids.  He is also drinking 1 boost shake and 3 ensures per day.  His weight is down 4 lbs from 02/08/14.  He reports a dry mouth and a mild sore throat.  He uses lidocaine occasionally.  He stopped taking Carafate.  The skin on his right neck has hyperpigmentation with some swelling under his right jaw.  He reports he had constipation which has now resolved.  He is having a bowel movement 2 times per day. He reports fatigue.   BP 148/77 mmHg  Pulse 85  Temp(Src) 98.7 F (37.1 C) (Oral)  Resp 20  Ht 5\' 1"  (1.549 m)  Wt 196 lb 8 oz (89.132 kg)  BMI 37.15 kg/m2  SpO2 97%   Wt Readings from Last 3 Encounters:  02/14/14 194 lb 12.8 oz (88.361 kg)  01/23/14 205 lb 3.2 oz (93.078 kg)  01/16/14 206 lb 1.6 oz (93.486 kg)

## 2014-03-13 NOTE — Progress Notes (Signed)
Radiation Oncology         [phone]) [phone] ________________________________  Name: Norman [name] MRN: [id number]  Date: 03/10/2014  DOB: [birth year]  Follow-Up Visit Note  Outpatient  CC: No PCP Per Patient  Norman Lark, MD  Diagnosis and Prior Radiotherapy:    ICD-9-CM ICD-10-CM   1. 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   Norman [name] here for follow up with his wife, daughter and grandson.  He denies pain but does report that his back teeth on the right side are painful and sensitive when eating.  He reports he is eating small amount of solids and liquids.  He is also drinking 1 boost shake and 3 ensures per day.  His weight is down 4 lbs from 02/08/14.  He reports a dry mouth and a mild sore throat.  He uses lidocaine occasionally.  He stopped taking Carafate.  The skin on his right neck has hyperpigmentation with some swelling under his right jaw.  He reports he had constipation which has now resolved.  He is having a bowel movement 2 times per day. He reports fatigue.   BP 148/77 mmHg  Pulse 85  Temp(Src) 98.7 F (37.1 C) (Oral)  Resp 20  Ht 5\' 1"  (1.549 m)  Wt 196 lb 8 oz (89.132 kg)  BMI 37.15 kg/m2  SpO2 97%   Wt Readings from Last 3 Encounters:  02/14/14 194 lb 12.8 oz (88.361 kg)  01/23/14 205 lb 3.2 oz (93.078 kg)  01/16/14 206 lb 1.6 oz (93.486 kg)                                ALLERGIES:  is allergic to penicillins.  Meds: Current Outpatient Prescriptions  Medication Sig Dispense Refill  . lidocaine (XYLOCAINE) 2 % solution Patient: Mix  1part 2% viscous lidocaine, 1part H20. Swish and/or swallow 80mL of this mixture, 71min before meals and at bedtime, up to QID 100 mL 5  . oxyCODONE (OXY IR/ROXICODONE) 5 MG immediate release tablet Take 1 tablet (5 mg total) by mouth every 6 (six) hours as needed for severe pain. 45 tablet 0  . emollient (BIAFINE) cream Apply topically 2 (two) times daily.    . fluconazole (DIFLUCAN) 100 MG tablet Take 2 tablets today, then 1 tablet daily x 20 more days. (Patient not taking: Reported on 03/10/2014) 22 tablet 0  . hydrochlorothiazide (HYDRODIURIL) 25 MG tablet Take 1 tablet (25 mg total) by mouth daily. (Patient not taking: Reported on 03/10/2014) 30 tablet 3  . lisinopril (PRINIVIL,ZESTRIL) 10 MG tablet Take 1 tablet (10 mg total) by mouth daily. (Patient not taking: Reported on 03/10/2014) 30 tablet 3  . LORazepam (ATIVAN) 0.5 MG tablet Take one to two tabs under tongue 30 minutes prior to each daily radiation treatment as needed for nervousness (Patient not taking: Reported on 03/10/2014) 40 tablet 0  . sucralfate (CARAFATE) 1 G tablet Dissolve 1 tablet in 10 mL H20 and swallow 30 min prior to  meals and bedtime. (Patient not taking: Reported on 03/10/2014) 40 tablet 3   No current facility-administered medications for this encounter.    Physical Findings: The patient is in no acute distress. Patient is alert and oriented.  height is 5\' 1"  (1.549 m) and weight is 196 lb 8 oz (89.132 kg). His oral temperature is 98.7 F (37.1 C). His blood pressure is 148/77 and his pulse is 85. His respiration is 20 and oxygen saturation is 97%. .   Oropharyngeal mucosa is intact with no thrush or lesions. No palpable cervical or supraclavicular lymphadenopathy. Skin intact and smooth over neck.      Lab Findings: Lab Results  Component Value Date   WBC 8.6 01/16/2014   HGB 14.0 01/16/2014   HCT 41.8 01/16/2014   MCV 89.4 01/16/2014   PLT 248 01/16/2014    Radiographic Findings: No results  found.  Impression/Plan:  Healing well from RT.  He requests a dental consult and so I will ask Gayleen Orem, RN, our Head and Neck Oncology Navigator to check if pt can be seen here at [facility], or if he should see a community dentist.  His tooth sensitivity may improve with time, but he needs regular dental care nonetheless, for his overall health.  I will release him to medical oncology for surveillance and see him back PRN.  Yearly TSH is recommended, ever though I tried to limit thyroid exposure to RT as much as possible.    [name], MD

## 2014-03-16 ENCOUNTER — Ambulatory Visit (HOSPITAL_COMMUNITY)
Admission: RE | Admit: 2014-03-16 | Discharge: 2014-03-16 | Disposition: A | Discharge status: Home / Self Care | Payer: BLUE CROSS/BLUE SHIELD | Source: Ambulatory Visit | Attending: Hematology and Oncology | Admitting: Hematology and Oncology

## 2014-03-16 ENCOUNTER — Other Ambulatory Visit (HOSPITAL_BASED_OUTPATIENT_CLINIC_OR_DEPARTMENT_OTHER): Payer: BLUE CROSS/BLUE SHIELD

## 2014-03-16 DIAGNOSIS — C8330 Diffuse large B-cell lymphoma, unspecified site: Secondary | ICD-10-CM

## 2014-03-16 DIAGNOSIS — C770 Secondary and unspecified malignant neoplasm of lymph nodes of head, face and neck: Secondary | ICD-10-CM

## 2014-03-16 LAB — CBC WITH DIFFERENTIAL/PLATELET
BASO%: 0.1 % (ref 0.0–2.0)
Basophils Absolute: 0 10*3/uL (ref 0.0–0.1)
EOS%: 1.9 % (ref 0.0–7.0)
Eosinophils Absolute: 0.1 10*3/uL (ref 0.0–0.5)
HCT: 43.5 % (ref 38.4–49.9)
HGB: 15.2 g/dL (ref 13.0–17.1)
LYMPH#: 1.4 10*3/uL (ref 0.9–3.3)
LYMPH%: 20.3 % (ref 14.0–49.0)
MCH: 30.5 pg (ref 27.2–33.4)
MCHC: 34.9 g/dL (ref 32.0–36.0)
MCV: 87.2 fL (ref 79.3–98.0)
MONO#: 0.9 10*3/uL (ref 0.1–0.9)
MONO%: 12.6 % (ref 0.0–14.0)
NEUT#: 4.4 10*3/uL (ref 1.5–6.5)
NEUT%: 65.1 % (ref 39.0–75.0)
Platelets: 188 10*3/uL (ref 140–400)
RBC: 4.99 10*6/uL (ref 4.20–5.82)
RDW: 13.7 % (ref 11.0–14.6)
WBC: 6.8 10*3/uL (ref 4.0–10.3)

## 2014-03-16 LAB — COMPREHENSIVE METABOLIC PANEL (CC13)
ALBUMIN: 3.8 g/dL (ref 3.5–5.0)
ALK PHOS: 100 U/L (ref 40–150)
ALT: 46 U/L (ref 0–55)
ANION GAP: 11 meq/L (ref 3–11)
AST: 29 U/L (ref 5–34)
BUN: 13.6 mg/dL (ref 7.0–26.0)
CALCIUM: 9.5 mg/dL (ref 8.4–10.4)
CO2: 26 meq/L (ref 22–29)
Chloride: 106 mEq/L (ref 98–109)
Creatinine: 1.1 mg/dL (ref 0.7–1.3)
EGFR: 71 mL/min/{1.73_m2} — ABNORMAL LOW (ref >90)
Glucose: 83 mg/dl (ref 70–140)
Potassium: 4.2 mEq/L (ref 3.5–5.1)
Sodium: 143 mEq/L (ref 136–145)
TOTAL PROTEIN: 7.7 g/dL (ref 6.4–8.3)
Total Bilirubin: 0.52 mg/dL (ref 0.20–1.20)

## 2014-03-16 LAB — GLUCOSE, CAPILLARY: Glucose-Capillary: 86 mg/dL (ref 70–99)

## 2014-03-16 LAB — LACTATE DEHYDROGENASE (CC13): LDH: 180 U/L (ref 125–245)

## 2014-03-16 MED ORDER — FLUDEOXYGLUCOSE F - 18 (FDG) INJECTION
9.7800 | Freq: Once | INTRAVENOUS | Status: AC | PRN
  Administered 2014-03-16: 9.78 via INTRAVENOUS

## 2014-03-16 MED ORDER — FLUDEOXYGLUCOSE F - 18 (FDG) INJECTION: 9.2800 | Freq: Once | INTRAVENOUS | Status: AC | PRN

## 2014-03-17 ENCOUNTER — Telehealth: Payer: Self-pay | Admitting: Hematology and Oncology

## 2014-03-17 ENCOUNTER — Ambulatory Visit (HOSPITAL_BASED_OUTPATIENT_CLINIC_OR_DEPARTMENT_OTHER): Payer: BLUE CROSS/BLUE SHIELD | Admitting: Hematology and Oncology

## 2014-03-17 ENCOUNTER — Encounter: Payer: Self-pay | Admitting: Hematology and Oncology

## 2014-03-17 VITALS — BP 147/86 | HR 61 | Temp 98.2°F | Resp 18 | Ht 61.0 in | Wt 193.5 lb

## 2014-03-17 DIAGNOSIS — I10 Essential (primary) hypertension: Secondary | ICD-10-CM | POA: Diagnosis not present

## 2014-03-17 DIAGNOSIS — K089 Disorder of teeth and supporting structures, unspecified: Secondary | ICD-10-CM

## 2014-03-17 DIAGNOSIS — K088 Other specified disorders of teeth and supporting structures: Secondary | ICD-10-CM

## 2014-03-17 DIAGNOSIS — Z8572 Personal history of non-Hodgkin lymphomas: Secondary | ICD-10-CM

## 2014-03-17 DIAGNOSIS — C8330 Diffuse large B-cell lymphoma, unspecified site: Secondary | ICD-10-CM

## 2014-03-17 HISTORY — DX: Disorder of teeth and supporting structures, unspecified: K08.9

## 2014-03-17 MED ORDER — LISINOPRIL 10 MG PO TABS: 10.0000 mg | ORAL_TABLET | Freq: Every day | ORAL | Status: DC

## 2014-03-17 NOTE — Assessment & Plan Note (Addendum)
Repeat  PET CT scan showed no evidence of disease. I will see him every 3 months with history, physical examination and blood work. I will defer imaging study to the future only if there are clinical suspicious for disease recurrence. I recommend him to take vitamin D and aspirin for preventive measure.  due to exposure to radiation, I will check TSH with the next blood draw.

## 2014-03-17 NOTE — Assessment & Plan Note (Signed)
He has teeth pain and recent exposure to radiation. I recommend dental consultation and evaluation to exclude infection.

## 2014-03-17 NOTE — Progress Notes (Signed)
[facility] OFFICE PROGRESS NOTE  Patient Care Team: No Pcp Per Patient as PCP - General (General Practice) [name], MD as Consulting Physician (Hematology and Oncology) [name], MD as Attending Physician (Radiation Oncology) Leota Sauers, RN as Oncology Nurse Navigator  SUMMARY OF ONCOLOGIC HISTORY: Oncology History   Diffuse large B cell lymphoma   Primary site: Lymphoid Neoplasms   Staging method: AJCC 6th Edition   Clinical: Stage II signed by [name], MD on 09/23/2013  5:06 PM   Summary: Stage II       Diffuse large B cell lymphoma   09/10/2013 Imaging CT scan of the neck confirmed abnormal tonsil region with regional lymphadenopathy   09/13/2013 Procedure Accession: [id number] consult biopsy showed atypical cells   09/21/2013 Surgery He underwent tonsil biopsy and right lymph node excisional biopsy.   09/21/2013 Pathology Results Accession: [id number] biopsy from tonsil confirmed diffuse large B-cell lymphoma   09/24/2013 Imaging PET CT scan confirmed abnormal oropharyngeal mass with regional lymphadenopathy   09/26/2013 Bone Marrow Biopsy Bone marrow biopsy showed no involvement of lymphoma   10/07/2013 - 11/17/2013 Chemotherapy He received 3 cycles of R. CHOP chemotherapy with dose adjustment due to abnormal liver function test along with prophylactic intrathecal chemotherapy with methotrexate   11/15/2013 Imaging CT scan of the neck show no evidence of abscess. Prior lymphadenopathy has reduced in size.   12/15/2013 Imaging PET/CT scan showed near complete response to treatment.   01/11/2014 - 02/14/2014 Radiation Therapy The patient received radiation treatment   03/16/2014 Imaging PET CT scan showed complete response to treatment.    INTERVAL HISTORY: Please see below for problem oriented charting.  he returns today to review test results. He complained of dental pain. Denies further neck wound or discharge. He eats well, denies dysphagia.  REVIEW OF  SYSTEMS:   Constitutional: Denies fevers, chills or abnormal weight loss Eyes: Denies blurriness of vision Ears, nose, mouth, throat, and face: Denies mucositis or sore throat Respiratory: Denies cough, dyspnea or wheezes Cardiovascular: Denies palpitation, chest discomfort or lower extremity swelling Gastrointestinal:  Denies nausea, heartburn or change in bowel habits Skin: Denies abnormal skin rashes Lymphatics: Denies new lymphadenopathy or easy bruising Neurological:Denies numbness, tingling or new weaknesses Behavioral/Psych: Mood is stable, no new changes  All other systems were reviewed with the patient and are negative.  I have reviewed the past medical history, past surgical history, social history and family history with the patient and they are unchanged from previous note.  ALLERGIES:  is allergic to penicillins.  MEDICATIONS:  Current Outpatient Prescriptions  Medication Sig Dispense Refill  . oxyCODONE (OXY IR/ROXICODONE) 5 MG immediate release tablet Take 1 tablet (5 mg total) by mouth every 6 (six) hours as needed for severe pain. 45 tablet 0  . lisinopril (PRINIVIL,ZESTRIL) 10 MG tablet Take 1 tablet (10 mg total) by mouth daily. 30 tablet 6   No current facility-administered medications for this visit.    PHYSICAL EXAMINATION: ECOG PERFORMANCE STATUS: 0 - Asymptomatic  Filed Vitals:   03/17/14 1206  BP: 147/86  Pulse: 61  Temp: 98.2 F (36.8 C)  Resp: 18   Filed Weights   03/17/14 1206  Weight: 193 lb 8 oz (87.771 kg)    GENERAL:alert, no distress and comfortable SKIN: skin color, texture, turgor are normal, no rashes or significant lesions EYES: normal, Conjunctiva are pink and non-injected, sclera clear OROPHARYNX:no exudate, no erythema and lips, buccal mucosa, and tongue normal  NECK: supple, thyroid normal size,  non-tender, without nodularity LYMPH:  no palpable lymphadenopathy in the cervical, axillary or inguinal LUNGS: clear to auscultation  and percussion with normal breathing effort HEART: regular rate & rhythm and no murmurs and no lower extremity edema ABDOMEN:abdomen soft, non-tender and normal bowel sounds Musculoskeletal:no cyanosis of digits and no clubbing  NEURO: alert & oriented x 3 with fluent speech, no focal motor/sensory deficits  LABORATORY DATA:  I have reviewed the data as listed    Component Value Date/Time   NA 143 03/16/2014 1038   NA 141 09/30/2013 0630   K 4.2 03/16/2014 1038   K 4.1 09/30/2013 0630   CL 102 09/30/2013 0630   CO2 26 03/16/2014 1038   CO2 29 09/30/2013 0630   GLUCOSE 83 03/16/2014 1038   GLUCOSE 71 09/30/2013 0630   BUN 13.6 03/16/2014 1038   BUN 17 09/30/2013 0630   CREATININE 1.1 03/16/2014 1038   CREATININE 1.00 09/30/2013 0630   CALCIUM 9.5 03/16/2014 1038   CALCIUM 8.8 09/30/2013 0630   PROT 7.7 03/16/2014 1038   PROT 7.1 09/30/2013 0630   ALBUMIN 3.8 03/16/2014 1038   ALBUMIN 2.6* 09/30/2013 0630   AST 29 03/16/2014 1038   AST 40* 09/30/2013 0630   ALT 46 03/16/2014 1038   ALT 109* 09/30/2013 0630   ALKPHOS 100 03/16/2014 1038   ALKPHOS 105 09/30/2013 0630   BILITOT 0.52 03/16/2014 1038   BILITOT 0.3 09/30/2013 0630   GFRNONAA 78* 09/30/2013 0630   GFRAA >90 09/30/2013 0630    No results found for: SPEP, UPEP  Lab Results  Component Value Date   WBC 6.8 03/16/2014   NEUTROABS 4.4 03/16/2014   HGB 15.2 03/16/2014   HCT 43.5 03/16/2014   MCV 87.2 03/16/2014   PLT 188 03/16/2014      Chemistry      Component Value Date/Time   NA 143 03/16/2014 1038   NA 141 09/30/2013 0630   K 4.2 03/16/2014 1038   K 4.1 09/30/2013 0630   CL 102 09/30/2013 0630   CO2 26 03/16/2014 1038   CO2 29 09/30/2013 0630   BUN 13.6 03/16/2014 1038   BUN 17 09/30/2013 0630   CREATININE 1.1 03/16/2014 1038   CREATININE 1.00 09/30/2013 0630      Component Value Date/Time   CALCIUM 9.5 03/16/2014 1038   CALCIUM 8.8 09/30/2013 0630   ALKPHOS 100 03/16/2014 1038   ALKPHOS  105 09/30/2013 0630   AST 29 03/16/2014 1038   AST 40* 09/30/2013 0630   ALT 46 03/16/2014 1038   ALT 109* 09/30/2013 0630   BILITOT 0.52 03/16/2014 1038   BILITOT 0.3 09/30/2013 0630       RADIOGRAPHIC STUDIES: I have personally reviewed the radiological images as listed and agreed with the findings in the report. Nm Pet Image Restag (ps) Skull Base To Thigh  03/16/2014   CLINICAL DATA:  Subsequent treatment strategy for diffuse large B-cell lymphoma.  EXAM: NUCLEAR MEDICINE PET SKULL BASE TO THIGH  TECHNIQUE: 9.8 mCi F-18 FDG was injected intravenously. Full-ring PET imaging was performed from the skull base to thigh after the radiotracer. CT data was obtained and used for attenuation correction and anatomic localization.  FASTING BLOOD GLUCOSE:  Value: 86 mg/dl  COMPARISON:  12/15/2013  FINDINGS: NECK  No hypermetabolic lymph nodes in the neck. Previously seen small hypermetabolic right level 2 lymph node is no longer visualized on today's study.  CHEST  No hypermetabolic mediastinal or hilar nodes. No suspicious pulmonary nodules on the CT  scan.  ABDOMEN/PELVIS  No abnormal hypermetabolic activity within the liver, pancreas, adrenal glands, or spleen. No hypermetabolic lymph nodes in the abdomen or pelvis. Mildly enlarged prostate gland appears stable.  SKELETON  No focal hypermetabolic activity to suggest skeletal metastasis.  IMPRESSION: Interval response to therapy. Resolution of previously seen small hypermetabolic right cervical level 2 lymph node. No evidence of residual metabolically active lymphoma.   Electronically Signed   By: Earle Gell M.D.   On: 03/16/2014 13:20     ASSESSMENT & PLAN:  Diffuse large B cell lymphoma Repeat  PET CT scan showed no evidence of disease. I will see him every 3 months with history, physical examination and blood work. I will defer imaging study to the future only if there are clinical suspicious for disease recurrence. I recommend him to take vitamin  D and aspirin for preventive measure.  due to exposure to radiation, I will check TSH with the next blood draw.   Essential hypertension  I will resume treatment with lisinopril. I also recommend aspirin therapy to prevent risk of heart disease.   Poor dentition  He has teeth pain and recent exposure to radiation. I recommend dental consultation and evaluation to exclude infection.    Orders Placed This Encounter  Procedures  . Lactate dehydrogenase    Standing Status: Standing     Number of Occurrences: 2     Standing Expiration Date: 03/17/2015  . TSH    Standing Status: Future     Number of Occurrences:      Standing Expiration Date: 04/21/2015  . Ambulatory referral to Dentistry    Referral Priority:  Routine    Referral Type:  Consultation    Referral Reason:  Specialty Services Required    Requested Specialty:  Dental General Practice    Number of Visits Requested:  1   All questions were answered. The patient knows to call the clinic with any problems, questions or concerns. No barriers to learning was detected. I spent 25 minutes counseling the patient face to face. The total time spent in the appointment was 30 minutes and more than 50% was on counseling and review of test results     [facility], Anahy Esh, MD 03/17/2014 1:39 PM

## 2014-03-17 NOTE — Telephone Encounter (Signed)
APPTS MADE AND AVS PRINTED FOR PT,LM FOR DR Enrique Sack [facility] TO CALL WITH NEW PT APPT    Norman [name]

## 2014-03-17 NOTE — Assessment & Plan Note (Signed)
I will resume treatment with lisinopril. I also recommend aspirin therapy to prevent risk of heart disease.

## 2014-03-29 ENCOUNTER — Encounter: Payer: Self-pay | Admitting: *Deleted

## 2014-03-30 ENCOUNTER — Encounter: Payer: Self-pay | Admitting: *Deleted

## 2014-03-30 NOTE — Progress Notes (Signed)
In follow-up to my conversation with [facility] yesterday afternoon re: patient referral for post-tmt dental follow-up, I faxed contact information for Dr. Jorja Loa, [facility], and myself, pending Dr. Elza Rafter need for information.   Successful tx notice received.  Gayleen Orem, RN, BSN, [place] at [place] [phone]

## 2014-03-30 NOTE — Progress Notes (Signed)
Spoke with Gregary Signs, [place] interpreter, indicated I had dental referral information for patient for her to relay.  At her request, I left copy of information in my mail basket for her pick-up.  Gayleen Orem, RN, BSN, [place] at [place] [phone] .mysig

## 2014-04-03 ENCOUNTER — Encounter: Payer: Self-pay | Admitting: Hematology and Oncology

## 2014-04-03 ENCOUNTER — Other Ambulatory Visit (HOSPITAL_BASED_OUTPATIENT_CLINIC_OR_DEPARTMENT_OTHER): Payer: BLUE CROSS/BLUE SHIELD

## 2014-04-03 ENCOUNTER — Ambulatory Visit (HOSPITAL_BASED_OUTPATIENT_CLINIC_OR_DEPARTMENT_OTHER): Payer: BLUE CROSS/BLUE SHIELD | Admitting: Hematology and Oncology

## 2014-04-03 ENCOUNTER — Telehealth: Payer: Self-pay | Admitting: Hematology and Oncology

## 2014-04-03 VITALS — BP 163/90 | HR 72 | Temp 98.1°F | Resp 20 | Ht 61.0 in | Wt 193.3 lb

## 2014-04-03 DIAGNOSIS — I890 Lymphedema, not elsewhere classified: Secondary | ICD-10-CM | POA: Diagnosis not present

## 2014-04-03 DIAGNOSIS — C770 Secondary and unspecified malignant neoplasm of lymph nodes of head, face and neck: Secondary | ICD-10-CM

## 2014-04-03 DIAGNOSIS — C8330 Diffuse large B-cell lymphoma, unspecified site: Secondary | ICD-10-CM

## 2014-04-03 DIAGNOSIS — I10 Essential (primary) hypertension: Secondary | ICD-10-CM

## 2014-04-03 DIAGNOSIS — C8331 Diffuse large B-cell lymphoma, lymph nodes of head, face, and neck: Secondary | ICD-10-CM

## 2014-04-03 LAB — COMPREHENSIVE METABOLIC PANEL (CC13)
ALBUMIN: 3.9 g/dL (ref 3.5–5.0)
ALK PHOS: 102 U/L (ref 40–150)
ALT: 40 U/L (ref 0–55)
AST: 24 U/L (ref 5–34)
Anion Gap: 10 mEq/L (ref 3–11)
BUN: 12.4 mg/dL (ref 7.0–26.0)
CO2: 26 mEq/L (ref 22–29)
Calcium: 9.6 mg/dL (ref 8.4–10.4)
Chloride: 105 mEq/L (ref 98–109)
Creatinine: 1 mg/dL (ref 0.7–1.3)
EGFR: 82 mL/min/{1.73_m2} — AB (ref >90)
GLUCOSE: 135 mg/dL (ref 70–140)
POTASSIUM: 4.2 meq/L (ref 3.5–5.1)
SODIUM: 141 meq/L (ref 136–145)
TOTAL PROTEIN: 7.8 g/dL (ref 6.4–8.3)
Total Bilirubin: 0.44 mg/dL (ref 0.20–1.20)

## 2014-04-03 LAB — CBC WITH DIFFERENTIAL/PLATELET
BASO%: 0.6 % (ref 0.0–2.0)
Basophils Absolute: 0 10*3/uL (ref 0.0–0.1)
EOS%: 1.8 % (ref 0.0–7.0)
Eosinophils Absolute: 0.1 10*3/uL (ref 0.0–0.5)
HCT: 44.7 % (ref 38.4–49.9)
HGB: 15 g/dL (ref 13.0–17.1)
LYMPH%: 18.2 % (ref 14.0–49.0)
MCH: 29.2 pg (ref 27.2–33.4)
MCHC: 33.7 g/dL (ref 32.0–36.0)
MCV: 86.7 fL (ref 79.3–98.0)
MONO#: 0.8 10*3/uL (ref 0.1–0.9)
MONO%: 11.7 % (ref 0.0–14.0)
NEUT%: 67.7 % (ref 39.0–75.0)
NEUTROS ABS: 4.6 10*3/uL (ref 1.5–6.5)
Platelets: 214 10*3/uL (ref 140–400)
RBC: 5.16 10*6/uL (ref 4.20–5.82)
RDW: 14 % (ref 11.0–14.6)
WBC: 6.7 10*3/uL (ref 4.0–10.3)
lymph#: 1.2 10*3/uL (ref 0.9–3.3)

## 2014-04-03 LAB — LACTATE DEHYDROGENASE (CC13): LDH: 181 U/L (ref 125–245)

## 2014-04-03 NOTE — Telephone Encounter (Signed)
Labs/ov per MD added for today... KJ

## 2014-04-03 NOTE — Assessment & Plan Note (Signed)
He has significant lymphedema around his neck and face due to prior surgery and recent radiation treatment. I recommend referral to physical therapy for exercise to do around his neck to reduce chronic lymphedema around his neck.

## 2014-04-03 NOTE — Progress Notes (Signed)
Norman [name] OFFICE PROGRESS NOTE  Patient Care Team: No Pcp Per Patient as PCP - General (General Practice) [name], MD as Consulting Physician (Hematology and Oncology) [name], MD as Attending Physician (Radiation Oncology) Leota Sauers, RN as Oncology Nurse Navigator  SUMMARY OF ONCOLOGIC HISTORY: Oncology History   Diffuse large B cell lymphoma   Primary site: Lymphoid Neoplasms   Staging method: AJCC 6th Edition   Clinical: Stage II signed by [name], MD on 09/23/2013  5:06 PM   Summary: Stage II       Diffuse large B cell lymphoma   09/10/2013 Imaging CT scan of the neck confirmed abnormal tonsil region with regional lymphadenopathy   09/13/2013 Procedure Accession: [id number] consult biopsy showed atypical cells   09/21/2013 Surgery He underwent tonsil biopsy and right lymph node excisional biopsy.   09/21/2013 Pathology Results Accession: [id number] biopsy from tonsil confirmed diffuse large B-cell lymphoma   09/24/2013 Imaging PET CT scan confirmed abnormal oropharyngeal mass with regional lymphadenopathy   09/26/2013 Bone Marrow Biopsy Bone marrow biopsy showed no involvement of lymphoma   10/07/2013 - 11/17/2013 Chemotherapy He received 3 cycles of R. CHOP chemotherapy with dose adjustment due to abnormal liver function test along with prophylactic intrathecal chemotherapy with methotrexate   11/15/2013 Imaging CT scan of the neck show no evidence of abscess. Prior lymphadenopathy has reduced in size.   12/15/2013 Imaging PET/CT scan showed near complete response to treatment.   01/11/2014 - 02/14/2014 Radiation Therapy The patient received radiation treatment   03/16/2014 Imaging PET CT scan showed complete response to treatment.    INTERVAL HISTORY: Please see below for problem oriented charting. He is seen urgently because of concern with swelling around his neck. He denies recent fevers or chills. He had persistent dry mouth but no reason to tooth  infection or abscess.  REVIEW OF SYSTEMS:   Constitutional: Denies fevers, chills or abnormal weight loss Eyes: Denies blurriness of vision Ears, nose, mouth, throat, and face: Denies mucositis or sore throat Respiratory: Denies cough, dyspnea or wheezes Cardiovascular: Denies palpitation, chest discomfort or lower extremity swelling Gastrointestinal:  Denies nausea, heartburn or change in bowel habits Skin: Denies abnormal skin rashes Lymphatics: Denies new lymphadenopathy or easy bruising Neurological:Denies numbness, tingling or new weaknesses Behavioral/Psych: Mood is stable, no new changes  All other systems were reviewed with the patient and are negative.  I have reviewed the past medical history, past surgical history, social history and family history with the patient and they are unchanged from previous note.  ALLERGIES:  is allergic to penicillins.  MEDICATIONS:  Current Outpatient Prescriptions  Medication Sig Dispense Refill  . lisinopril (PRINIVIL,ZESTRIL) 10 MG tablet Take 1 tablet (10 mg total) by mouth daily. 30 tablet 6   No current facility-administered medications for this visit.    PHYSICAL EXAMINATION: ECOG PERFORMANCE STATUS: 0 - Asymptomatic  Filed Vitals:   04/03/14 1441  BP: 163/90  Pulse: 72  Temp: 98.1 F (36.7 C)  Resp: 20   Filed Weights   04/03/14 1441  Weight: 193 lb 4.8 oz (87.68 kg)    GENERAL:alert, no distress and comfortable SKIN: skin color, texture, turgor are normal, no rashes or significant lesions EYES: normal, Conjunctiva are pink and non-injected, sclera clear OROPHARYNX:no exudate, no erythema and lips, buccal mucosa, and tongue normal  NECK: well healed surgical scar. Noted lymphedema around his neck  LYMPH:  no palpable lymphadenopathy in the cervical, axillary or inguinal LUNGS: clear to auscultation and percussion  with normal breathing effort HEART: regular rate & rhythm and no murmurs and no lower extremity  edema ABDOMEN:abdomen soft, non-tender and normal bowel sounds Musculoskeletal:no cyanosis of digits and no clubbing  NEURO: alert & oriented x 3 with fluent speech, no focal motor/sensory deficits  LABORATORY DATA:  I have reviewed the data as listed    Component Value Date/Time   NA 143 03/16/2014 1038   NA 141 09/30/2013 0630   K 4.2 03/16/2014 1038   K 4.1 09/30/2013 0630   CL 102 09/30/2013 0630   CO2 26 03/16/2014 1038   CO2 29 09/30/2013 0630   GLUCOSE 83 03/16/2014 1038   GLUCOSE 71 09/30/2013 0630   BUN 13.6 03/16/2014 1038   BUN 17 09/30/2013 0630   CREATININE 1.1 03/16/2014 1038   CREATININE 1.00 09/30/2013 0630   CALCIUM 9.5 03/16/2014 1038   CALCIUM 8.8 09/30/2013 0630   PROT 7.7 03/16/2014 1038   PROT 7.1 09/30/2013 0630   ALBUMIN 3.8 03/16/2014 1038   ALBUMIN 2.6* 09/30/2013 0630   AST 29 03/16/2014 1038   AST 40* 09/30/2013 0630   ALT 46 03/16/2014 1038   ALT 109* 09/30/2013 0630   ALKPHOS 100 03/16/2014 1038   ALKPHOS 105 09/30/2013 0630   BILITOT 0.52 03/16/2014 1038   BILITOT 0.3 09/30/2013 0630   GFRNONAA 78* 09/30/2013 0630   GFRAA >90 09/30/2013 0630    No results found for: SPEP, UPEP  Lab Results  Component Value Date   WBC 6.7 04/03/2014   NEUTROABS 4.6 04/03/2014   HGB 15.0 04/03/2014   HCT 44.7 04/03/2014   MCV 86.7 04/03/2014   PLT 214 04/03/2014      Chemistry      Component Value Date/Time   NA 143 03/16/2014 1038   NA 141 09/30/2013 0630   K 4.2 03/16/2014 1038   K 4.1 09/30/2013 0630   CL 102 09/30/2013 0630   CO2 26 03/16/2014 1038   CO2 29 09/30/2013 0630   BUN 13.6 03/16/2014 1038   BUN 17 09/30/2013 0630   CREATININE 1.1 03/16/2014 1038   CREATININE 1.00 09/30/2013 0630      Component Value Date/Time   CALCIUM 9.5 03/16/2014 1038   CALCIUM 8.8 09/30/2013 0630   ALKPHOS 100 03/16/2014 1038   ALKPHOS 105 09/30/2013 0630   AST 29 03/16/2014 1038   AST 40* 09/30/2013 0630   ALT 46 03/16/2014 1038   ALT 109*  09/30/2013 0630   BILITOT 0.52 03/16/2014 1038   BILITOT 0.3 09/30/2013 0630      ASSESSMENT & PLAN:  Diffuse large B cell lymphoma Repeat  PET CT scan showed no evidence of disease. His repeat blood work today is stable.    Essential hypertension His blood pressure is high. He is not taking his blood pressure medicine consistently. I reminded him to take his medication regularly.   Lymphedema of face He has significant lymphedema around his neck and face due to prior surgery and recent radiation treatment. I recommend referral to physical therapy for exercise to do around his neck to reduce chronic lymphedema around his neck.    Orders Placed This Encounter  Procedures  . Ambulatory referral to Physical Therapy    Referral Priority:  Routine    Referral Type:  Physical Medicine    Referral Reason:  Specialty Services Required    Requested Specialty:  Physical Therapy    Number of Visits Requested:  1   All questions were answered. The patient knows to call  the clinic with any problems, questions or concerns. No barriers to learning was detected. I spent 15 minutes counseling the patient face to face. The total time spent in the appointment was 20 minutes and more than 50% was on counseling and review of test results     [facility], [place], MD 04/03/2014 2:55 PM

## 2014-04-03 NOTE — Assessment & Plan Note (Addendum)
Repeat  PET CT scan showed no evidence of disease. His repeat blood work today is stable.

## 2014-04-03 NOTE — Assessment & Plan Note (Signed)
His blood pressure is high. He is not taking his blood pressure medicine consistently. I reminded him to take his medication regularly.

## 2014-04-10 ENCOUNTER — Encounter: Payer: Self-pay | Admitting: *Deleted

## 2014-04-10 NOTE — Progress Notes (Signed)
Spoke with Norman [name], requested she follow-up with patient re: his attendance at [facility] this Wednesday at 12:45 for lymphedema evaluation.  She verbalized understanding. I sent email to Paulo Fruit asking for interpreter.  Gayleen Orem, RN, BSN, [place] [facility] [place] [phone]

## 2014-04-12 ENCOUNTER — Ambulatory Visit: Payer: BLUE CROSS/BLUE SHIELD | Attending: Hematology and Oncology | Admitting: Physical Therapy

## 2014-04-12 DIAGNOSIS — M436 Torticollis: Secondary | ICD-10-CM | POA: Diagnosis not present

## 2014-04-12 DIAGNOSIS — I890 Lymphedema, not elsewhere classified: Secondary | ICD-10-CM | POA: Diagnosis not present

## 2014-04-12 DIAGNOSIS — L905 Scar conditions and fibrosis of skin: Secondary | ICD-10-CM

## 2014-04-12 NOTE — Therapy (Signed)
[place], [place], [address] Phone: [phone]   Fax:  [phone]  Physical Therapy Evaluation  Patient Details  Name: Norman [name] MRN: [id number] Date of Birth: 06/18/1950 Referring Provider:  [name], MD  Encounter Date: 04/12/2014      PT End of Session - 04/12/14 1340    Visit Number 1   Number of Visits 4   Date for PT Re-Evaluation 05/11/14   PT Start Time 1250   PT Stop Time 1340   PT Time Calculation (min) 50 min   Activity Tolerance Patient tolerated treatment well   Behavior During Therapy [facility] for tasks assessed/performed      Past Medical History  Diagnosis Date  . Arthritis   . Cataract   . Depression   . Cancer   . Hard of hearing   . Lymphoma   . History of radiation therapy 01/11/14- 02/14/14    right tonsil, oropharynx, neck, upper paratracheal region/ 46 Gy/ 23 fx to high risk tissue; 41.4 Gy /23 fx to intermediate  risk tissue  . Poor dentition 03/17/2014    Past Surgical History  Procedure Laterality Date  . Mass biopsy Right 09/21/2013    Procedure: EXCISIONAL BIOPSY RIGHT NECK NODE;  Surgeon: [name], MD;  Location: [place];  Service: ENT;  Laterality: Right;  with frozen sections  . Tonsillectomy Right 09/21/2013    Procedure: RIGHT TONSILLECTOMY;  Surgeon: [name], MD;  Location: [place];  Service: ENT;  Laterality: Right;  . Tonsillectomy      There were no vitals filed for this visit.  Visit Diagnosis:  Adolescent lymphedema - Plan: PT plan of care cert/re-cert  Stiffness of cervical spine - Plan: PT plan of care cert/re-cert  Scar condition and fibrosis of skin - Plan: PT plan of care cert/re-cert      Subjective Assessment - 04/12/14 1253    Subjective Swelling started 15 days ago.  Right side of neck is bigger every day, left side varies from day to day.  also reports pain and weakness in right 3rd-5th digits.   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 Yes   Pain Score 0-No pain  up to 6   Pain Location Neck   Pain Orientation Right   Aggravating Factors  touching it   Pain Relieving Factors not touching it   Multiple Pain Sites Yes   Pain Score 8   Pain Location Finger (Comment which one)  3-5, worse lately on 5th   Pain Orientation Right   Aggravating Factors  trying to do things   Pain Relieving Factors nothing            [facility] PT Assessment - 04/12/14 0001    Assessment   Medical Diagnosis neck mass diagnosed as lymphoma   Onset Date 09/10/13  approx.   Precautions   Precautions Other (comment)   Precaution Comments cancer precautions   Restrictions   Weight Bearing Restrictions No   Balance Screen   Has the patient fallen in the past 6 months No   Has the patient had a decrease in activity level because of a fear of falling?  No   Is the patient reluctant to leave their home because of a  fear of falling?  No   Home Environment   Living Enviornment Private residence   Living Arrangements Spouse/significant other   Home Layout One level   Prior Function   Level of Independence Independent with basic ADLs;Independent with homemaking with ambulation;Independent with gait   Vocation Unemployed  since treatment   Leisure walks 30-60 minutes daily for exercise   Observation/Other Assessments   Observations firmness and fullness at right neck from midline back to ear, right mandible area and around to left mandible   Skin Integrity intact; healed incicion at right neck   Posture/Postural Control   Posture/Postural Control Postural limitations   Postural Limitations Rounded Shoulders;Forward head   ROM / Strength   AROM / PROM / Strength  AROM   AROM   Overall AROM Comments right shoulder flexion and abduction 10% loss; left WFL   AROM Assessment Site Cervical   Cervical Flexion WNL   Cervical Extension 20% loss and tight   Cervical - Right Side Bend 25% loss   Cervical - Left Side Bend 50% loss   Cervical - Right Rotation 25% loss   Cervical - Left Rotation 25% loss           LYMPHEDEMA/ONCOLOGY QUESTIONNAIRE - 04/12/14 1305    Type   Cancer Type neck mass diagnosed as lymphoma   Treatment   Past Chemotherapy Treatment Yes   Past Radiation Treatment Yes   Date 02/14/14   What other symptoms do you have   Is there Decreased scar mobility Yes   Lymphedema Assessments   Lymphedema Assessments Head and Neck   Head and Neck   4 cm superior to sternal notch around neck 44 cm   6 cm superior to sternal notch around neck 45.5 cm   8 cm superior to sternal notch around neck 48 cm                        PT Education - 04/12/14 1338    Education provided Yes   Education Details gave foam chip pack and placed it on his neck swelling; to be worn 4 hours a day, not during sleep; monitor swelling, induration   Person(s) Educated Patient;Spouse;Child(ren)   Methods Explanation;Demonstration   Comprehension Verbalized understanding                Long Term Clinic Goals - 04/12/14 1347    CC Long Term Goal  #1   Title patient/family will be independent in performing manual lymph drainage for neck   Time 3   Period Weeks   Status New   CC Long Term Goal  #2   Title Patient will be independent in scar mobilization.   Time 3   Period Weeks   Status New   CC Long Term Goal  #3   Title Patient will be independent in HEP for neck ROM.   Time 3   Period Weeks   Status New   CC Long Term Goal  #4   Title Pt. will know how and where to obtain a neck compression garment.   Time 3   Period Weeks   Status New            Plan - 04/12/14 1340    Clinical Impression Statement Pt. with  induration and swelling at right > left neck and up around jaw.   Pt will benefit from skilled therapeutic intervention in order to improve on the following deficits Increased edema;Decreased range  of motion;Increased fascial restricitons;Decreased scar mobility;Decreased knowledge of use of DME;Decreased knowledge of precautions   Rehab Potential Good   PT Frequency 1x / week   PT Duration 3 weeks   PT Treatment/Interventions Scar mobilization;Manual lymph drainage;Passive range of motion;Patient/family education;Therapeutic exercise;Other (comment)  assist with obtaining compression garment   PT Next Visit Plan begin scar mobilization and manual lymph drainage along with instruction in those for HEP; neck ROM to follow; later, show manufactured compression garments   Consulted and Agree with Plan of Care Patient         Problem List Patient Active Problem List   Diagnosis Date Noted  . Lymphedema of face 04/03/2014  . Poor dentition 03/17/2014  . 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 Hard of hearing   . Depression   . Throat pain 09/23/2013  . Diffuse large B cell lymphoma 09/21/2013  . Essential hypertension 09/11/2013    Loyed Wilmes 04/12/2014, 2:32 PM  [place], [place], [address] Phone: [phone]   Fax:  [place], PT 04/12/2014 2:32 PM

## 2014-05-17 ENCOUNTER — Ambulatory Visit: Payer: Medicaid Other | Attending: Hematology and Oncology | Admitting: Physical Therapy

## 2014-05-17 DIAGNOSIS — M436 Torticollis: Secondary | ICD-10-CM | POA: Diagnosis not present

## 2014-05-17 DIAGNOSIS — L905 Scar conditions and fibrosis of skin: Secondary | ICD-10-CM | POA: Diagnosis not present

## 2014-05-17 DIAGNOSIS — I890 Lymphedema, not elsewhere classified: Secondary | ICD-10-CM | POA: Diagnosis not present

## 2014-05-17 NOTE — Patient Instructions (Addendum)
Manual Lymph Drainage for face/neck   1) Place hands on areas just above collar bones and do 15-20 stationary circles 2) Place one hand on the back and one on the front of the shoulder; do stationary circles with pressure on going downward toward armpits, 10 times. 3) Do stationary circles at right armpit area (about 10 times) and the left armpit (about 10 times)       4)         Place hands on either side of neck and do 15-20 stationary circles with pressure back and down   Place one hand on side of neck farther forward and do stationary circles back and down.   Place webspace of hand on front of neck and do "pumps" downward. 5) Standing at the head of the bed, do stationary circles on each side of the face at the jaw line (15-20) 6) Standing at the head of the bed, do stationary circles on each side of the face on the cheeks (15-20) 7) Standing at the head of the bed, do stationary circles on each side of the face between the eyes and ears (15-20) 8) Repeat step 4 9) Repeat steps 1, 2, and 3     Do not slide on the skin Only give enough pressure to stretch the skin Make sure to always wash your hands prior to massage  Do daily.

## 2014-05-17 NOTE — Therapy (Signed)
[place], [place], [address] Phone: [phone]   Fax:  [phone]  Physical Therapy Treatment  Patient Details  Name: Norman [name] MRN: [id number] Date of Birth: 27-Feb-1950 Referring Provider:  [name], MD  Encounter Date: 05/17/2014      PT End of Session - 05/17/14 1552    Visit Number 2   Number of Visits 2   PT Start Time 9470   PT Stop Time 1446   PT Time Calculation (min) 47 min   Activity Tolerance Patient tolerated treatment well   Behavior During Therapy [facility] for tasks assessed/performed      Past Medical History  Diagnosis Date  . Arthritis   . Cataract   . Depression   . Cancer   . Hard of hearing   . Lymphoma   . History of radiation therapy 01/11/14- 02/14/14    right tonsil, oropharynx, neck, upper paratracheal region/ 46 Gy/ 23 fx to high risk tissue; 41.4 Gy /23 fx to intermediate  risk tissue  . Poor dentition 03/17/2014    Past Surgical History  Procedure Laterality Date  . Mass biopsy Right 09/21/2013    Procedure: EXCISIONAL BIOPSY RIGHT NECK NODE;  Surgeon: [name], MD;  Location: [place];  Service: ENT;  Laterality: Right;  with frozen sections  . Tonsillectomy Right 09/21/2013    Procedure: RIGHT TONSILLECTOMY;  Surgeon: [name], MD;  Location: [place];  Service: ENT;  Laterality: Right;  . Tonsillectomy      There were no vitals filed for this visit.  Visit Diagnosis:  Acquired lymphedema      Subjective Assessment - 05/17/14 1405    Subjective Wife reports he is taking an antibiotic, a pain pill, and a mouthwash for his teeth.  Having trouble keeping the foam chip pack on the neck--it wants to fall down, but he keeps it on for four hours.    The compression helps reduce it, but swelling comes up once it is removed.        Patient is accompained by: Family member;Interpreter   Currently in Pain? No/denies               LYMPHEDEMA/ONCOLOGY  QUESTIONNAIRE - 05/17/14 1440    Head and Neck   4 cm superior to sternal notch around neck 43.7 cm   6 cm superior to sternal notch around neck 42.2 cm   8 cm superior to sternal notch around neck 43.8 cm                  [facility] Adult PT Treatment/Exercise - 05/17/14 0001    Manual Therapy   Manual Therapy Manual Lymphatic Drainage (MLD);Edema management   Edema Management circumference measurements taken   Manual Lymphatic Drainage (MLD) short neck, bilateral axillae, bilateral shoulders, lateral and anaterior neck, chin and cheeks; instructed patient's wife to do this through interpreter and with tactile cueing                PT Education - 05/17/14 1551    Education provided Yes   Education Details manual lymph drainage for neck swelling   Person(s) Educated Spouse   Methods Explanation;Demonstration;Tactile cues;Verbal cues;Handout  interpreter present   Comprehension Verbalized understanding;Returned demonstration                [facility] Goals - 05/17/14 1556    CC Long Term Goal  #1   Status Achieved   CC Long Term Goal  #  2   Status Not Met   CC Long Term Goal  #3   Status Not Met   CC Long Term Goal  #4   Status Deferred            Plan - 05/17/14 1552    Clinical Impression Statement Patient's neck circumference measurements taken and showed very nice reductions today.  Patient's wife learned manual lymph drainage and did well with that.  Patient was scheduled to return for more visits but his wife said they don't need any more.  She also said they didn't need a manufactured compression garment but would continue to use the foam chips in stockinette that I gave them.                                                             Pt will benefit from skilled therapeutic intervention in order to improve on the following deficits Increased edema;Decreased range of motion;Increased fascial restricitons;Decreased scar mobility;Decreased  knowledge of use of DME;Decreased knowledge of precautions   PT Treatment/Interventions Patient/family education;Manual lymph drainage;DME Instruction   PT Next Visit Plan None;  patient (his wife) chooses discharge.   PT Home Exercise Plan do manual lymph drainage daily   Recommended Other Services none; patient's wife says they do not need fitting for a manufactured compression garment   Consulted and Agree with Plan of Care Patient;Family member/caregiver        Problem List Patient Active Problem List   Diagnosis Date Noted  . Lymphedema of face 04/03/2014  . Poor dentition 03/17/2014  . 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 Hard of hearing   . Depression   . Throat pain 09/23/2013  . Diffuse large B cell lymphoma 09/21/2013  . Essential hypertension 09/11/2013    SALISBURY,DONNA 05/17/2014, 4:00 PM  [place], [place], [address] Phone: [phone]   Fax:  [phone]  PHYSICAL THERAPY DISCHARGE SUMMARY  Visits from Start of Care: 2  Current functional level related to goals / functional outcomes: Manual lymph drainage education/independence goal met. Patient chooses not to be fitted for a manufactured compression garment, but to continue to use the foam chips in stockinette we gave him Other goals not met.   Remaining deficits: Neck ROM was not reassessed.   Education / Equipment: Instructed wife in performing manual lymph drainage. Plan: Patient agrees to discharge.  Patient goals were partially met. Patient is being discharged due to the patient's request.  ?????       Serafina Royals, PT 05/17/2014 4:02 PM

## 2014-05-23 ENCOUNTER — Encounter: Payer: Self-pay | Admitting: Hematology and Oncology

## 2014-05-23 NOTE — Progress Notes (Signed)
Per ms. silva-- FYI the patient has been discharged from case management  [employer]. Faxed recd was just [facility]

## 2014-05-23 NOTE — Progress Notes (Signed)
I left message for m silva about fax recd. Not sure if FYI or do we need to fill out and return.

## 2014-06-16 ENCOUNTER — Telehealth: Payer: Self-pay | Admitting: Hematology and Oncology

## 2014-06-16 ENCOUNTER — Other Ambulatory Visit: Payer: BLUE CROSS/BLUE SHIELD

## 2014-06-16 ENCOUNTER — Ambulatory Visit: Payer: BLUE CROSS/BLUE SHIELD | Admitting: Hematology and Oncology

## 2014-06-16 ENCOUNTER — Other Ambulatory Visit: Payer: Self-pay | Admitting: Hematology and Oncology

## 2014-06-16 NOTE — Telephone Encounter (Signed)
S.wAlmyra Free she will contact pt with new appt d.t

## 2014-07-03 ENCOUNTER — Ambulatory Visit (HOSPITAL_BASED_OUTPATIENT_CLINIC_OR_DEPARTMENT_OTHER): Payer: BLUE CROSS/BLUE SHIELD | Admitting: Hematology and Oncology

## 2014-07-03 ENCOUNTER — Encounter: Payer: Self-pay | Admitting: Hematology and Oncology

## 2014-07-03 ENCOUNTER — Other Ambulatory Visit (HOSPITAL_BASED_OUTPATIENT_CLINIC_OR_DEPARTMENT_OTHER): Payer: BLUE CROSS/BLUE SHIELD

## 2014-07-03 ENCOUNTER — Telehealth: Payer: Self-pay | Admitting: Hematology and Oncology

## 2014-07-03 VITALS — BP 146/71 | HR 57 | Temp 98.2°F | Resp 19 | Ht 61.0 in | Wt 191.9 lb

## 2014-07-03 DIAGNOSIS — C8330 Diffuse large B-cell lymphoma, unspecified site: Secondary | ICD-10-CM

## 2014-07-03 DIAGNOSIS — I10 Essential (primary) hypertension: Secondary | ICD-10-CM

## 2014-07-03 DIAGNOSIS — I890 Lymphedema, not elsewhere classified: Secondary | ICD-10-CM

## 2014-07-03 DIAGNOSIS — C8331 Diffuse large B-cell lymphoma, lymph nodes of head, face, and neck: Secondary | ICD-10-CM | POA: Diagnosis not present

## 2014-07-03 LAB — TSH CHCC: TSH: 2.601 m[IU]/L (ref 0.320–4.118)

## 2014-07-03 LAB — LACTATE DEHYDROGENASE (CC13): LDH: 154 U/L (ref 125–245)

## 2014-07-03 NOTE — Progress Notes (Signed)
[name] OFFICE PROGRESS NOTE  Patient Care Team: No Pcp Per Patient as PCP - General (General Practice) [name], MD as Consulting Physician (Hematology and Oncology) [name], MD as Attending Physician (Radiation Oncology) Leota Sauers, RN as Oncology Nurse Navigator  SUMMARY OF ONCOLOGIC HISTORY: Oncology History   Diffuse large B cell lymphoma   Primary site: Lymphoid Neoplasms   Staging method: AJCC 6th Edition   Clinical: Stage II signed by [name], MD on 09/23/2013  5:06 PM   Summary: Stage II       Diffuse large B cell lymphoma   09/10/2013 Imaging CT scan of the neck confirmed abnormal tonsil region with regional lymphadenopathy   09/13/2013 Procedure Accession: [id number] consult biopsy showed atypical cells   09/21/2013 Surgery He underwent tonsil biopsy and right lymph node excisional biopsy.   09/21/2013 Pathology Results Accession: [id number] biopsy from tonsil confirmed diffuse large B-cell lymphoma   09/24/2013 Imaging PET CT scan confirmed abnormal oropharyngeal mass with regional lymphadenopathy   09/26/2013 Bone Marrow Biopsy Bone marrow biopsy showed no involvement of lymphoma   10/07/2013 - 11/17/2013 Chemotherapy He received 3 cycles of R. CHOP chemotherapy with dose adjustment due to abnormal liver function test along with prophylactic intrathecal chemotherapy with methotrexate   11/15/2013 Imaging CT scan of the neck show no evidence of abscess. Prior lymphadenopathy has reduced in size.   12/15/2013 Imaging PET/CT scan showed near complete response to treatment.   01/11/2014 - 02/14/2014 Radiation Therapy The patient received radiation treatment   03/16/2014 Imaging PET CT scan showed complete response to treatment.    INTERVAL HISTORY: Please see below for problem oriented charting. He returns for further follow-up. Denies new neck masses. He swallows okay. Have very mild persistent dry mouth. He denies recent infection. Denies new  lymphadenopathy.  REVIEW OF SYSTEMS:   Constitutional: Denies fevers, chills or abnormal weight loss Eyes: Denies blurriness of vision Ears, nose, mouth, throat, and face: Denies mucositis or sore throat Respiratory: Denies cough, dyspnea or wheezes Cardiovascular: Denies palpitation, chest discomfort or lower extremity swelling Gastrointestinal:  Denies nausea, heartburn or change in bowel habits Skin: Denies abnormal skin rashes Lymphatics: Denies new lymphadenopathy or easy bruising Neurological:Denies numbness, tingling or new weaknesses Behavioral/Psych: Mood is stable, no new changes  All other systems were reviewed with the patient and are negative.  I have reviewed the past medical history, past surgical history, social history and family history with the patient and they are unchanged from previous note.  ALLERGIES:  is allergic to penicillins.  MEDICATIONS:  Current Outpatient Prescriptions  Medication Sig Dispense Refill  . ibuprofen (ADVIL,MOTRIN) 200 MG tablet Take 200 mg by mouth every 6 (six) hours as needed.    Marland Kitchen lisinopril (PRINIVIL,ZESTRIL) 10 MG tablet Take 1 tablet (10 mg total) by mouth daily. 30 tablet 6   No current facility-administered medications for this visit.    PHYSICAL EXAMINATION: ECOG PERFORMANCE STATUS: 0 - Asymptomatic  Filed Vitals:   07/03/14 1001  BP: 146/71  Pulse: 57  Temp: 98.2 F (36.8 C)  Resp: 19   Filed Weights   07/03/14 1001  Weight: 191 lb 14.4 oz (87.045 kg)    GENERAL:alert, no distress and comfortable SKIN: skin color, texture, turgor are normal, no rashes or significant lesions EYES: normal, Conjunctiva are pink and non-injected, sclera clear OROPHARYNX:no exudate, no erythema and lips, buccal mucosa, and tongue normal  NECK: Well-healed surgical scar. Palpable lymphedema around his neck but no new lymphadenopathy.  LYMPH:  no palpable lymphadenopathy in the cervical, axillary or inguinal LUNGS: clear to  auscultation and percussion with normal breathing effort HEART: regular rate & rhythm and no murmurs and no lower extremity edema ABDOMEN:abdomen soft, non-tender and normal bowel sounds Musculoskeletal:no cyanosis of digits and no clubbing  NEURO: alert & oriented x 3 with fluent speech, no focal motor/sensory deficits  LABORATORY DATA:  I have reviewed the data as listed    Component Value Date/Time   NA 141 04/03/2014 1421   NA 141 09/30/2013 0630   K 4.2 04/03/2014 1421   K 4.1 09/30/2013 0630   CL 102 09/30/2013 0630   CO2 26 04/03/2014 1421   CO2 29 09/30/2013 0630   GLUCOSE 135 04/03/2014 1421   GLUCOSE 71 09/30/2013 0630   BUN 12.4 04/03/2014 1421   BUN 17 09/30/2013 0630   CREATININE 1.0 04/03/2014 1421   CREATININE 1.00 09/30/2013 0630   CALCIUM 9.6 04/03/2014 1421   CALCIUM 8.8 09/30/2013 0630   PROT 7.8 04/03/2014 1421   PROT 7.1 09/30/2013 0630   ALBUMIN 3.9 04/03/2014 1421   ALBUMIN 2.6* 09/30/2013 0630   AST 24 04/03/2014 1421   AST 40* 09/30/2013 0630   ALT 40 04/03/2014 1421   ALT 109* 09/30/2013 0630   ALKPHOS 102 04/03/2014 1421   ALKPHOS 105 09/30/2013 0630   BILITOT 0.44 04/03/2014 1421   BILITOT 0.3 09/30/2013 0630   GFRNONAA 78* 09/30/2013 0630   GFRAA >90 09/30/2013 0630    No results found for: SPEP, UPEP  Lab Results  Component Value Date   WBC 6.7 04/03/2014   NEUTROABS 4.6 04/03/2014   HGB 15.0 04/03/2014   HCT 44.7 04/03/2014   MCV 86.7 04/03/2014   PLT 214 04/03/2014      Chemistry      Component Value Date/Time   NA 141 04/03/2014 1421   NA 141 09/30/2013 0630   K 4.2 04/03/2014 1421   K 4.1 09/30/2013 0630   CL 102 09/30/2013 0630   CO2 26 04/03/2014 1421   CO2 29 09/30/2013 0630   BUN 12.4 04/03/2014 1421   BUN 17 09/30/2013 0630   CREATININE 1.0 04/03/2014 1421   CREATININE 1.00 09/30/2013 0630      Component Value Date/Time   CALCIUM 9.6 04/03/2014 1421   CALCIUM 8.8 09/30/2013 0630   ALKPHOS 102 04/03/2014 1421    ALKPHOS 105 09/30/2013 0630   AST 24 04/03/2014 1421   AST 40* 09/30/2013 0630   ALT 40 04/03/2014 1421   ALT 109* 09/30/2013 0630   BILITOT 0.44 04/03/2014 1421   BILITOT 0.3 09/30/2013 0630       ASSESSMENT & PLAN:  Diffuse large B cell lymphoma Repeat  PET CT scan in March 2016 showed no evidence of disease. His repeat blood work today is stable. Clinically, he has no signs of disease recurrence. Continue blood work, history and physical examination in 3 months.     Lymphedema of face He has significant lymphedema around his neck and face due to prior surgery and recent radiation treatment. I recommend referral to physical therapy for exercise to do around his neck to reduce chronic lymphedema around his neck.  Essential hypertension His blood pressure is better since he resumed taking lisinopril. Continue close observation.   Orders Placed This Encounter  Procedures  . CBC with Differential/Platelet    Standing Status: Future     Number of Occurrences:      Standing Expiration Date: 08/07/2015  . Comprehensive  metabolic panel    Standing Status: Future     Number of Occurrences:      Standing Expiration Date: 08/07/2015  . Lactate dehydrogenase    Standing Status: Future     Number of Occurrences:      Standing Expiration Date: 08/07/2015  . TSH    Standing Status: Future     Number of Occurrences:      Standing Expiration Date: 08/07/2015   All questions were answered. The patient knows to call the clinic with any problems, questions or concerns. No barriers to learning was detected. I spent 15 minutes counseling the patient face to face. The total time spent in the appointment was 20 minutes and more than 50% was on counseling and review of test results     [facility], [place], MD 07/03/2014 2:48 PM

## 2014-07-03 NOTE — Assessment & Plan Note (Signed)
Repeat  PET CT scan in March 2016 showed no evidence of disease. His repeat blood work today is stable. Clinically, he has no signs of disease recurrence. Continue blood work, history and physical examination in 3 months.

## 2014-07-03 NOTE — Telephone Encounter (Signed)
Gave adn printed appt sched adn avs for pt for SEpt

## 2014-07-03 NOTE — Assessment & Plan Note (Signed)
His blood pressure is better since he resumed taking lisinopril. Continue close observation.

## 2014-07-03 NOTE — Assessment & Plan Note (Signed)
He has significant lymphedema around his neck and face due to prior surgery and recent radiation treatment. I recommend referral to physical therapy for exercise to do around his neck to reduce chronic lymphedema around his neck.

## 2014-10-02 ENCOUNTER — Telehealth: Payer: Self-pay | Admitting: Hematology and Oncology

## 2014-10-02 NOTE — Telephone Encounter (Signed)
sw pt and r/s appt ....pt ok and aware of new d.t °

## 2014-10-03 ENCOUNTER — Ambulatory Visit: Payer: Medicaid Other | Admitting: Hematology and Oncology

## 2014-10-03 ENCOUNTER — Other Ambulatory Visit: Payer: Medicaid Other

## 2014-10-10 ENCOUNTER — Ambulatory Visit (HOSPITAL_BASED_OUTPATIENT_CLINIC_OR_DEPARTMENT_OTHER): Payer: Medicaid Other | Admitting: Hematology and Oncology

## 2014-10-10 ENCOUNTER — Other Ambulatory Visit (HOSPITAL_BASED_OUTPATIENT_CLINIC_OR_DEPARTMENT_OTHER): Payer: Medicaid Other

## 2014-10-10 ENCOUNTER — Other Ambulatory Visit (HOSPITAL_BASED_OUTPATIENT_CLINIC_OR_DEPARTMENT_OTHER): Payer: Medicaid Other | Admitting: *Deleted

## 2014-10-10 ENCOUNTER — Telehealth: Payer: Self-pay | Admitting: Hematology and Oncology

## 2014-10-10 ENCOUNTER — Encounter: Payer: Self-pay | Admitting: Hematology and Oncology

## 2014-10-10 DIAGNOSIS — I10 Essential (primary) hypertension: Secondary | ICD-10-CM | POA: Diagnosis not present

## 2014-10-10 DIAGNOSIS — Z23 Encounter for immunization: Secondary | ICD-10-CM

## 2014-10-10 DIAGNOSIS — Z8572 Personal history of non-Hodgkin lymphomas: Secondary | ICD-10-CM

## 2014-10-10 DIAGNOSIS — C8330 Diffuse large B-cell lymphoma, unspecified site: Secondary | ICD-10-CM

## 2014-10-10 DIAGNOSIS — I890 Lymphedema, not elsewhere classified: Secondary | ICD-10-CM | POA: Diagnosis not present

## 2014-10-10 DIAGNOSIS — C8331 Diffuse large B-cell lymphoma, lymph nodes of head, face, and neck: Secondary | ICD-10-CM

## 2014-10-10 DIAGNOSIS — Z299 Encounter for prophylactic measures, unspecified: Secondary | ICD-10-CM

## 2014-10-10 LAB — COMPREHENSIVE METABOLIC PANEL (CC13)
ALBUMIN: 4.1 g/dL (ref 3.5–5.0)
ALK PHOS: 98 U/L (ref 40–150)
ALT: 33 U/L (ref 0–55)
ANION GAP: 8 meq/L (ref 3–11)
AST: 26 U/L (ref 5–34)
BILIRUBIN TOTAL: 0.48 mg/dL (ref 0.20–1.20)
BUN: 20.5 mg/dL (ref 7.0–26.0)
CALCIUM: 9.6 mg/dL (ref 8.4–10.4)
CO2: 26 mEq/L (ref 22–29)
CREATININE: 1.2 mg/dL (ref 0.7–1.3)
Chloride: 107 mEq/L (ref 98–109)
EGFR: 66 mL/min/{1.73_m2} — AB (ref >90)
Glucose: 85 mg/dl (ref 70–140)
Potassium: 4.2 mEq/L (ref 3.5–5.1)
Sodium: 140 mEq/L (ref 136–145)
TOTAL PROTEIN: 7.8 g/dL (ref 6.4–8.3)

## 2014-10-10 LAB — TSH CHCC: TSH: 4.313 m[IU]/L — AB (ref 0.320–4.118)

## 2014-10-10 LAB — CBC WITH DIFFERENTIAL/PLATELET
BASO%: 0.7 % (ref 0.0–2.0)
Basophils Absolute: 0.1 10*3/uL (ref 0.0–0.1)
EOS ABS: 0.1 10*3/uL (ref 0.0–0.5)
EOS%: 1.1 % (ref 0.0–7.0)
HEMATOCRIT: 44.8 % (ref 38.4–49.9)
HGB: 15.2 g/dL (ref 13.0–17.1)
LYMPH%: 31 % (ref 14.0–49.0)
MCH: 29.8 pg (ref 27.2–33.4)
MCHC: 33.9 g/dL (ref 32.0–36.0)
MCV: 87.9 fL (ref 79.3–98.0)
MONO#: 0.9 10*3/uL (ref 0.1–0.9)
MONO%: 10 % (ref 0.0–14.0)
NEUT%: 57.2 % (ref 39.0–75.0)
NEUTROS ABS: 5.1 10*3/uL (ref 1.5–6.5)
Platelets: 210 10*3/uL (ref 140–400)
RBC: 5.09 10*6/uL (ref 4.20–5.82)
RDW: 13.4 % (ref 11.0–14.6)
WBC: 8.9 10*3/uL (ref 4.0–10.3)
lymph#: 2.8 10*3/uL (ref 0.9–3.3)

## 2014-10-10 LAB — LACTATE DEHYDROGENASE (CC13): LDH: 174 U/L (ref 125–245)

## 2014-10-10 MED ORDER — LISINOPRIL 20 MG PO TABS: 20.0000 mg | ORAL_TABLET | Freq: Every day | ORAL | Status: DC

## 2014-10-10 MED ORDER — INFLUENZA VAC SPLIT QUAD 0.5 ML IM SUSY
0.5000 mL | PREFILLED_SYRINGE | Freq: Once | INTRAMUSCULAR | Status: AC
  Administered 2014-10-10: 0.5 mL via INTRAMUSCULAR
  Filled 2014-10-10: qty 0.5

## 2014-10-10 NOTE — Progress Notes (Signed)
[place] OFFICE PROGRESS NOTE  Patient Care Team: No Pcp Per Patient as PCP - General (General Practice) [name], MD as Consulting Physician (Hematology and Oncology) [name], MD as Attending Physician (Radiation Oncology) Leota Sauers, RN as Oncology Nurse Navigator  SUMMARY OF ONCOLOGIC HISTORY: Oncology History   Diffuse large B cell lymphoma   Primary site: Lymphoid Neoplasms   Staging method: AJCC 6th Edition   Clinical: Stage II signed by [name], MD on 09/23/2013  5:06 PM   Summary: Stage II       History of B-cell lymphoma   09/10/2013 Imaging CT scan of the neck confirmed abnormal tonsil region with regional lymphadenopathy   09/13/2013 Procedure Accession: [id number] consult biopsy showed atypical cells   09/21/2013 Surgery He underwent tonsil biopsy and right lymph node excisional biopsy.   09/21/2013 Pathology Results Accession: [id number] biopsy from tonsil confirmed diffuse large B-cell lymphoma   09/24/2013 Imaging PET CT scan confirmed abnormal oropharyngeal mass with regional lymphadenopathy   09/26/2013 Bone Marrow Biopsy Bone marrow biopsy showed no involvement of lymphoma   10/07/2013 - 11/17/2013 Chemotherapy He received 3 cycles of R. CHOP chemotherapy with dose adjustment due to abnormal liver function test along with prophylactic intrathecal chemotherapy with methotrexate   11/15/2013 Imaging CT scan of the neck show no evidence of abscess. Prior lymphadenopathy has reduced in size.   12/15/2013 Imaging PET/CT scan showed near complete response to treatment.   01/11/2014 - 02/14/2014 Radiation Therapy The patient received radiation treatment   03/16/2014 Imaging PET CT scan showed complete response to treatment.    INTERVAL HISTORY: Please see below for problem oriented charting. He returns for further follow-up. History is obtained via an interpreter. He denies any persistent dry mouth, difficulty swallowing or new lymphadenopathy. He  stated he has been compliant taking all his medications. He has gained some weight since the last time I saw him.  REVIEW OF SYSTEMS:   Constitutional: Denies fevers, chills or abnormal weight loss Eyes: Denies blurriness of vision Ears, nose, mouth, throat, and face: Denies mucositis or sore throat Respiratory: Denies cough, dyspnea or wheezes Cardiovascular: Denies palpitation, chest discomfort or lower extremity swelling Gastrointestinal:  Denies nausea, heartburn or change in bowel habits Skin: Denies abnormal skin rashes Lymphatics: Denies new lymphadenopathy or easy bruising Neurological:Denies numbness, tingling or new weaknesses Behavioral/Psych: Mood is stable, no new changes  All other systems were reviewed with the patient and are negative.  I have reviewed the past medical history, past surgical history, social history and family history with the patient and they are unchanged from previous note.  ALLERGIES:  is allergic to penicillins.  MEDICATIONS:  Current Outpatient Prescriptions  Medication Sig Dispense Refill  . lisinopril (PRINIVIL,ZESTRIL) 20 MG tablet Take 1 tablet (20 mg total) by mouth daily. 30 tablet 6  . ibuprofen (ADVIL,MOTRIN) 200 MG tablet Take 200 mg by mouth every 6 (six) hours as needed.     No current facility-administered medications for this visit.    PHYSICAL EXAMINATION: ECOG PERFORMANCE STATUS: 0 - Asymptomatic Blood pressure 166/80, heart rate 55, respiration rate 18, temperature is 98.6 GENERAL:alert, no distress and comfortable. He is obese SKIN: skin color, texture, turgor are normal, no rashes or significant lesions EYES: normal, Conjunctiva are pink and non-injected, sclera clear OROPHARYNX:no exudate, no erythema and lips, buccal mucosa, and tongue normal  NECK: well-healed surgical scar. Note that lymphedema around the right side of the neck with palpable lump on the right submandibular region.  LYMPH:  no palpable lymphadenopathy in  the cervical, axillary or inguinal LUNGS: clear to auscultation and percussion with normal breathing effort HEART: regular rate & rhythm and no murmurs and no lower extremity edema ABDOMEN:abdomen soft, non-tender and normal bowel sounds Musculoskeletal:no cyanosis of digits and no clubbing  NEURO: alert & oriented x 3 with fluent speech, no focal motor/sensory deficits  LABORATORY DATA:  I have reviewed the data as listed    Component Value Date/Time   NA 140 10/10/2014 1207   NA 141 09/30/2013 0630   K 4.2 10/10/2014 1207   K 4.1 09/30/2013 0630   CL 102 09/30/2013 0630   CO2 26 10/10/2014 1207   CO2 29 09/30/2013 0630   GLUCOSE 85 10/10/2014 1207   GLUCOSE 71 09/30/2013 0630   BUN 20.5 10/10/2014 1207   BUN 17 09/30/2013 0630   CREATININE 1.2 10/10/2014 1207   CREATININE 1.00 09/30/2013 0630   CALCIUM 9.6 10/10/2014 1207   CALCIUM 8.8 09/30/2013 0630   PROT 7.8 10/10/2014 1207   PROT 7.1 09/30/2013 0630   ALBUMIN 4.1 10/10/2014 1207   ALBUMIN 2.6* 09/30/2013 0630   AST 26 10/10/2014 1207   AST 40* 09/30/2013 0630   ALT 33 10/10/2014 1207   ALT 109* 09/30/2013 0630   ALKPHOS 98 10/10/2014 1207   ALKPHOS 105 09/30/2013 0630   BILITOT 0.48 10/10/2014 1207   BILITOT 0.3 09/30/2013 0630   GFRNONAA 78* 09/30/2013 0630   GFRAA >90 09/30/2013 0630    No results found for: SPEP, UPEP  Lab Results  Component Value Date   WBC 8.9 10/10/2014   NEUTROABS 5.1 10/10/2014   HGB 15.2 10/10/2014   HCT 44.8 10/10/2014   MCV 87.9 10/10/2014   PLT 210 10/10/2014      Chemistry      Component Value Date/Time   NA 140 10/10/2014 1207   NA 141 09/30/2013 0630   K 4.2 10/10/2014 1207   K 4.1 09/30/2013 0630   CL 102 09/30/2013 0630   CO2 26 10/10/2014 1207   CO2 29 09/30/2013 0630   BUN 20.5 10/10/2014 1207   BUN 17 09/30/2013 0630   CREATININE 1.2 10/10/2014 1207   CREATININE 1.00 09/30/2013 0630      Component Value Date/Time   CALCIUM 9.6 10/10/2014 1207   CALCIUM  8.8 09/30/2013 0630   ALKPHOS 98 10/10/2014 1207   ALKPHOS 105 09/30/2013 0630   AST 26 10/10/2014 1207   AST 40* 09/30/2013 0630   ALT 33 10/10/2014 1207   ALT 109* 09/30/2013 0630   BILITOT 0.48 10/10/2014 1207   BILITOT 0.3 09/30/2013 0630     ASSESSMENT & PLAN:  History of B-cell lymphoma Clinically, he is doing well with normal blood work and without new symptoms. He has a lot of lymphedema around the neck with a persisting mass on the right side of the neck which is difficult to ascertain whether this could represent disease relapse. I recommend CT scan with contrast for evaluation in view of high risk disease at presentation. I will call his son report. If CT scan showed no evidence of cancer recurrence, I plan to see him back next in 6 months with repeat history, physical examination and blood work.  Essential hypertension The patient had progressive weight gain and difficult to control high blood pressure. I recommend referral to see an internist to establish primary care. I plan to increase lisinopril to 20 mg daily and reassess. Hopefully, his new primary care doctor can adjust  his medications in the future.  Lymphedema of face He has significant lymphedema from prior surgery and radiation. It is not causing any restriction of movement. He has been to physical therapy in the past. Recommend observation only.   Orders Placed This Encounter  Procedures  . CT Soft Tissue Neck W Contrast    Standing Status: Future     Number of Occurrences:      Standing Expiration Date: 01/10/2016    Order Specific Question:  Reason for Exam (SYMPTOM  OR DIAGNOSIS REQUIRED)    Answer:  left neck swelling, history of lymphoma, exclude disease    Order Specific Question:  Preferred imaging location?    Answer:  [facility]  . CBC with Differential/Platelet    Standing Status: Future     Number of Occurrences:      Standing Expiration Date: 11/14/2015  . Comprehensive  metabolic panel    Standing Status: Future     Number of Occurrences:      Standing Expiration Date: 11/14/2015  . Lactate dehydrogenase    Standing Status: Future     Number of Occurrences:      Standing Expiration Date: 11/14/2015  . TSH-Thyroid Stimulating Hormone    Standing Status: Future     Number of Occurrences: 1     Standing Expiration Date: 01/02/2016  . Ambulatory referral to Internal Medicine    Referral Priority:  Routine    Referral Type:  Consultation    Referral Reason:  Specialty Services Required    Requested Specialty:  Internal Medicine    Number of Visits Requested:  1   All questions were answered. The patient knows to call the clinic with any problems, questions or concerns. No barriers to learning was detected. I spent 25 minutes counseling the patient face to face. The total time spent in the appointment was 30 minutes and more than 50% was on counseling and review of test results     [facility] [place], [place], MD 10/10/2014 2:48 PM

## 2014-10-10 NOTE — Assessment & Plan Note (Signed)
The patient had progressive weight gain and difficult to control high blood pressure. I recommend referral to see an internist to establish primary care. I plan to increase lisinopril to 20 mg daily and reassess. Hopefully, his new primary care doctor can adjust his medications in the future.

## 2014-10-10 NOTE — Telephone Encounter (Signed)
per pof to sch pt appt-gave pt copy of avs-pt has medicaid and I adv to go to PCP on card per [employer]

## 2014-10-10 NOTE — Assessment & Plan Note (Signed)
He has significant lymphedema from prior surgery and radiation. It is not causing any restriction of movement. He has been to physical therapy in the past. Recommend observation only.

## 2014-10-10 NOTE — Assessment & Plan Note (Signed)
Clinically, he is doing well with normal blood work and without new symptoms. He has a lot of lymphedema around the neck with a persisting mass on the right side of the neck which is difficult to ascertain whether this could represent disease relapse. I recommend CT scan with contrast for evaluation in view of high risk disease at presentation. I will call his son report. If CT scan showed no evidence of cancer recurrence, I plan to see him back next in 6 months with repeat history, physical examination and blood work.

## 2014-10-17 ENCOUNTER — Ambulatory Visit (HOSPITAL_COMMUNITY): Payer: Medicaid Other

## 2014-10-17 ENCOUNTER — Ambulatory Visit (HOSPITAL_COMMUNITY)
Admission: RE | Admit: 2014-10-17 | Discharge: 2014-10-17 | Disposition: A | Discharge status: Home / Self Care | Payer: Medicaid Other | Source: Ambulatory Visit | Attending: Hematology and Oncology | Admitting: Hematology and Oncology

## 2014-10-17 DIAGNOSIS — C8331 Diffuse large B-cell lymphoma, lymph nodes of head, face, and neck: Secondary | ICD-10-CM | POA: Insufficient documentation

## 2014-10-17 MED ORDER — IOHEXOL 300 MG/ML  SOLN
75.0000 mL | Freq: Once | INTRAMUSCULAR | Status: AC | PRN
Start: 2014-10-17 — End: 2014-10-17
  Administered 2014-10-17: 75 mL via INTRAVENOUS

## 2014-10-18 ENCOUNTER — Telehealth: Payer: Self-pay | Admitting: Hematology and Oncology

## 2014-10-18 ENCOUNTER — Encounter: Payer: Self-pay | Admitting: Hematology and Oncology

## 2014-10-18 ENCOUNTER — Other Ambulatory Visit: Payer: Self-pay | Admitting: Hematology and Oncology

## 2014-10-18 DIAGNOSIS — R5383 Other fatigue: Secondary | ICD-10-CM

## 2014-10-18 DIAGNOSIS — Z8572 Personal history of non-Hodgkin lymphomas: Secondary | ICD-10-CM

## 2014-10-18 HISTORY — DX: Other fatigue: R53.83

## 2014-10-18 NOTE — Telephone Encounter (Signed)
I called his son's telephone number and got hold of the patient's daughter-in-law who speaks excellent English. I informed her that the CT scan from yesterday showed no evidence of residual lymphoma.

## 2014-11-27 ENCOUNTER — Ambulatory Visit: Payer: Medicaid Other | Attending: Family Medicine | Admitting: Family Medicine

## 2014-11-27 ENCOUNTER — Encounter: Payer: Self-pay | Admitting: Family Medicine

## 2014-11-27 VITALS — BP 131/71 | HR 58 | Temp 97.8°F | Resp 16 | Ht 61.0 in | Wt 200.0 lb

## 2014-11-27 DIAGNOSIS — I10 Essential (primary) hypertension: Secondary | ICD-10-CM | POA: Insufficient documentation

## 2014-11-27 DIAGNOSIS — Z923 Personal history of irradiation: Secondary | ICD-10-CM | POA: Diagnosis not present

## 2014-11-27 DIAGNOSIS — Z0000 Encounter for general adult medical examination without abnormal findings: Secondary | ICD-10-CM | POA: Diagnosis not present

## 2014-11-27 DIAGNOSIS — Z9114 Patient's other noncompliance with medication regimen: Secondary | ICD-10-CM | POA: Insufficient documentation

## 2014-11-27 DIAGNOSIS — H11002 Unspecified pterygium of left eye: Secondary | ICD-10-CM | POA: Diagnosis not present

## 2014-11-27 DIAGNOSIS — H6092 Unspecified otitis externa, left ear: Secondary | ICD-10-CM | POA: Insufficient documentation

## 2014-11-27 MED ORDER — LISINOPRIL 20 MG PO TABS: 20.0000 mg | ORAL_TABLET | Freq: Every day | ORAL | Status: DC

## 2014-11-27 MED ORDER — CIPROFLOXACIN-DEXAMETHASONE 0.3-0.1 % OT SUSP: 4.0000 [drp] | Freq: Two times a day (BID) | OTIC | Status: AC

## 2014-11-27 NOTE — Progress Notes (Signed)
Establish Care  C/C ear ache  Pain scale #7 Hx HTN, no medication x 3 days  No tobacco user  No Suicide thought in the past two week

## 2014-11-27 NOTE — Patient Instructions (Addendum)
Norman [name] was seen today for hypertension and ear pain.  Diagnoses and all orders for this visit:  Essential hypertension -     lisinopril (PRINIVIL,ZESTRIL) 20 MG tablet; Take 1 tablet (20 mg total) by mouth daily.  Left otitis externa -     ciprofloxacin-dexamethasone (CIPRODEX) otic suspension; Place 4 drops into the left ear 2 (two) times daily. For 7 days   F/u in 6 months for HTN  Dr. Adrian Blackwater

## 2014-11-27 NOTE — Progress Notes (Signed)
Subjective:  Patient ID: Norman [name], male    DOB: Aug 21, 1950  Age: 64 y.o. MRN: [id number]  CC: Hypertension and Ear Pain   HPI Norman [name] presents for the following with his daughter, Norman [name]   1. Ear ache: bilateral ear pain x one year. With hearing loss. Worsened on L side one week ago. Tried some drops that were at home. Drops made pain worse. No fever, dizziness or tinnitus. He has hx of head and neck radiation for non-hodgkin's lymphoma of R tonsil from 01/11/2014-02/14/2014. He has numbness on R neck and R arm post treatment.    2. CHRONIC HYPERTENSION  Disease Monitoring  Blood pressure range: not checking   Chest pain: no   Dyspnea: no   Claudication: no   Medication compliance: no, out 3 days ago   Medication Side Effects  Lightheadedness: no   Urinary frequency: no   Edema: no   3. Healthcare maintenance: due for screening colonoscopy.   Past Medical History  Diagnosis Date  . Arthritis   . Cataract   . Depression   . Cancer ([place])   . Hard of hearing   . Lymphoma ([place])   . History of radiation therapy 01/11/14- 02/14/14    right tonsil, oropharynx, neck, upper paratracheal region/ 46 Gy/ 23 fx to high risk tissue; 41.4 Gy /23 fx to intermediate  risk tissue  . Poor dentition 03/17/2014  . Other fatigue 10/18/2014    Past Surgical History  Procedure Laterality Date  . Mass biopsy Right 09/21/2013    Procedure: EXCISIONAL BIOPSY RIGHT NECK NODE;  Surgeon: [name], MD;  Location: [facility];  Service: ENT;  Laterality: Right;  with frozen sections  . Tonsillectomy Right 09/21/2013    Procedure: RIGHT TONSILLECTOMY;  Surgeon: [name], MD;  Location: [facility];  Service: ENT;  Laterality: Right;  . Tonsillectomy      Family History  Problem Relation Age of Onset  . Cancer Mother     Gi cancer  . Diabetes Sister     Social History  Substance Use Topics  . Smoking status: Never Smoker   . Smokeless tobacco: Never Used  . Alcohol Use: No     ROS Review of Systems  Constitutional: Negative for fever, chills, fatigue and unexpected weight change.  HENT: Positive for ear pain and hearing loss. Negative for tinnitus.   Eyes: Negative for visual disturbance.  Respiratory: Negative for cough and shortness of breath.   Cardiovascular: Negative for chest pain, palpitations and leg swelling.  Gastrointestinal: Negative for nausea, vomiting, abdominal pain, diarrhea, constipation and blood in stool.  Endocrine: Negative for polydipsia, polyphagia and polyuria.  Musculoskeletal: Negative for myalgias, back pain, arthralgias, gait problem and neck pain.  Skin: Negative for rash.  Allergic/Immunologic: Negative for immunocompromised state.  Neurological: Negative for dizziness.  Hematological: Negative for adenopathy. Does not bruise/bleed easily.  Psychiatric/Behavioral: Negative for suicidal ideas, sleep disturbance and dysphoric mood. The patient is not nervous/anxious.     Objective:   Today's Vitals: BP 131/71 mmHg  Pulse 58  Temp(Src) 97.8 F (36.6 C) (Oral)  Resp 16  Ht 5\' 1"  (1.549 m)  Wt 200 lb (90.719 kg)  BMI 37.81 kg/m2  SpO2 100%  Physical Exam  Constitutional: He appears well-developed and well-nourished. No distress.  HENT:  Head: Normocephalic and atraumatic.  Right Ear: Tympanic membrane, external ear and ear canal normal.  Left Ear: There is swelling.  Swelling in canal   Eyes: Right conjunctiva is not injected.  Right conjunctiva has no hemorrhage. Left conjunctiva is not injected. Left conjunctiva has no hemorrhage. Right eye exhibits normal extraocular motion. Left eye exhibits normal extraocular motion.    Neck: Normal range of motion. Neck supple.  Cardiovascular: Normal rate, regular rhythm, normal heart sounds and intact distal pulses.   Pulmonary/Chest: Effort normal and breath sounds normal.  Musculoskeletal: He exhibits no edema.  Neurological: He is alert.  Skin: Skin is warm and dry. No  rash noted. No erythema.  Psychiatric: He has a normal mood and affect.   Assessment & Plan:   Amy was seen today for hypertension and ear pain.  Diagnoses and all orders for this visit:  Essential hypertension -     lisinopril (PRINIVIL,ZESTRIL) 20 MG tablet; Take 1 tablet (20 mg total) by mouth daily.  Left otitis externa -     ciprofloxacin-dexamethasone (CIPRODEX) otic suspension; Place 4 drops into the left ear 2 (two) times daily. For 7 days  Pterygium of left eye -     Ambulatory referral to Ophthalmology  Healthcare maintenance -     Ambulatory referral to Gastroenterology   No problem-specific assessment & plan notes found for this encounter.   Outpatient Encounter Prescriptions as of 11/27/2014  Medication Sig  . ibuprofen (ADVIL,MOTRIN) 200 MG tablet Take 200 mg by mouth every 6 (six) hours as needed.  Marland Kitchen lisinopril (PRINIVIL,ZESTRIL) 20 MG tablet Take 1 tablet (20 mg total) by mouth daily.   No facility-administered encounter medications on file as of 11/27/2014.    Follow-up: No Follow-up on file.    Boykin Nearing MD

## 2015-02-20 ENCOUNTER — Other Ambulatory Visit: Payer: Self-pay | Admitting: Hematology and Oncology

## 2015-02-20 DIAGNOSIS — H04123 Dry eye syndrome of bilateral lacrimal glands: Secondary | ICD-10-CM | POA: Diagnosis not present

## 2015-02-20 DIAGNOSIS — H401133 Primary open-angle glaucoma, bilateral, severe stage: Secondary | ICD-10-CM | POA: Diagnosis not present

## 2015-02-20 DIAGNOSIS — H11042 Peripheral pterygium, stationary, left eye: Secondary | ICD-10-CM | POA: Diagnosis not present

## 2015-02-20 DIAGNOSIS — H401134 Primary open-angle glaucoma, bilateral, indeterminate stage: Secondary | ICD-10-CM | POA: Diagnosis not present

## 2015-02-20 DIAGNOSIS — H2513 Age-related nuclear cataract, bilateral: Secondary | ICD-10-CM | POA: Diagnosis not present

## 2015-02-26 DIAGNOSIS — H401133 Primary open-angle glaucoma, bilateral, severe stage: Secondary | ICD-10-CM | POA: Diagnosis not present

## 2015-02-26 DIAGNOSIS — H04123 Dry eye syndrome of bilateral lacrimal glands: Secondary | ICD-10-CM | POA: Diagnosis not present

## 2015-02-26 DIAGNOSIS — H2513 Age-related nuclear cataract, bilateral: Secondary | ICD-10-CM | POA: Diagnosis not present

## 2015-02-26 DIAGNOSIS — H11012 Amyloid pterygium of left eye: Secondary | ICD-10-CM | POA: Diagnosis not present

## 2015-04-10 ENCOUNTER — Ambulatory Visit (HOSPITAL_BASED_OUTPATIENT_CLINIC_OR_DEPARTMENT_OTHER): Payer: Medicare Other | Admitting: Hematology and Oncology

## 2015-04-10 ENCOUNTER — Telehealth: Payer: Self-pay | Admitting: Hematology and Oncology

## 2015-04-10 ENCOUNTER — Other Ambulatory Visit (HOSPITAL_BASED_OUTPATIENT_CLINIC_OR_DEPARTMENT_OTHER): Payer: Medicare Other

## 2015-04-10 ENCOUNTER — Encounter: Payer: Self-pay | Admitting: Hematology and Oncology

## 2015-04-10 ENCOUNTER — Other Ambulatory Visit: Payer: Self-pay | Admitting: Hematology and Oncology

## 2015-04-10 VITALS — BP 160/80 | HR 60 | Temp 98.2°F | Resp 18 | Wt 204.9 lb

## 2015-04-10 DIAGNOSIS — Z8572 Personal history of non-Hodgkin lymphomas: Secondary | ICD-10-CM

## 2015-04-10 DIAGNOSIS — I890 Lymphedema, not elsewhere classified: Secondary | ICD-10-CM | POA: Diagnosis not present

## 2015-04-10 DIAGNOSIS — I10 Essential (primary) hypertension: Secondary | ICD-10-CM

## 2015-04-10 DIAGNOSIS — E039 Hypothyroidism, unspecified: Secondary | ICD-10-CM

## 2015-04-10 DIAGNOSIS — C8331 Diffuse large B-cell lymphoma, lymph nodes of head, face, and neck: Secondary | ICD-10-CM

## 2015-04-10 DIAGNOSIS — R5383 Other fatigue: Secondary | ICD-10-CM

## 2015-04-10 HISTORY — DX: Hypothyroidism, unspecified: E03.9

## 2015-04-10 LAB — CBC WITH DIFFERENTIAL/PLATELET
BASO%: 0.6 % (ref 0.0–2.0)
BASOS ABS: 0.1 10*3/uL (ref 0.0–0.1)
EOS%: 1.8 % (ref 0.0–7.0)
Eosinophils Absolute: 0.2 10*3/uL (ref 0.0–0.5)
HEMATOCRIT: 47.6 % (ref 38.4–49.9)
HGB: 16 g/dL (ref 13.0–17.1)
LYMPH#: 3.2 10*3/uL (ref 0.9–3.3)
LYMPH%: 35.1 % (ref 14.0–49.0)
MCH: 29.6 pg (ref 27.2–33.4)
MCHC: 33.6 g/dL (ref 32.0–36.0)
MCV: 88.1 fL (ref 79.3–98.0)
MONO#: 0.9 10*3/uL (ref 0.1–0.9)
MONO%: 10.1 % (ref 0.0–14.0)
NEUT#: 4.8 10*3/uL (ref 1.5–6.5)
NEUT%: 52.4 % (ref 39.0–75.0)
PLATELETS: 194 10*3/uL (ref 140–400)
RBC: 5.4 10*6/uL (ref 4.20–5.82)
RDW: 14.2 % (ref 11.0–14.6)
WBC: 9.1 10*3/uL (ref 4.0–10.3)

## 2015-04-10 LAB — COMPREHENSIVE METABOLIC PANEL
ALBUMIN: 4 g/dL (ref 3.5–5.0)
ALK PHOS: 96 U/L (ref 40–150)
ALT: 44 U/L (ref 0–55)
ANION GAP: 8 meq/L (ref 3–11)
AST: 30 U/L (ref 5–34)
BILIRUBIN TOTAL: 0.54 mg/dL (ref 0.20–1.20)
BUN: 15.8 mg/dL (ref 7.0–26.0)
CALCIUM: 9.8 mg/dL (ref 8.4–10.4)
CO2: 29 mEq/L (ref 22–29)
Chloride: 105 mEq/L (ref 98–109)
Creatinine: 1.2 mg/dL (ref 0.7–1.3)
EGFR: 61 mL/min/{1.73_m2} — AB (ref >90)
GLUCOSE: 88 mg/dL (ref 70–140)
Potassium: 4.5 mEq/L (ref 3.5–5.1)
Sodium: 142 mEq/L (ref 136–145)
TOTAL PROTEIN: 8.1 g/dL (ref 6.4–8.3)

## 2015-04-10 LAB — LACTATE DEHYDROGENASE: LDH: 173 U/L (ref 125–245)

## 2015-04-10 LAB — TSH: TSH: 4.517 m(IU)/L — ABNORMAL HIGH (ref 0.320–4.118)

## 2015-04-10 MED ORDER — LISINOPRIL 20 MG PO TABS: 20.0000 mg | ORAL_TABLET | Freq: Every day | ORAL | Status: DC

## 2015-04-10 MED FILL — LISINOPRIL 20 MG TABLET: 20 | 90 days supply | Qty: 90 | Fill #0

## 2015-04-10 NOTE — Assessment & Plan Note (Signed)
Clinically, he is doing well with normal blood work and without new symptoms. He has a lot of lymphedema around the neck with a persisting mass on the right side of the neck which is difficult to ascertain whether this could represent disease relapse. CT scan with contrast last year showed no evidence of cancer I plan to see him back next in 6 months with repeat history, physical examination and blood work.

## 2015-04-10 NOTE — Telephone Encounter (Signed)
Gave and printed appt shced and avs for pt for OCT °

## 2015-04-10 NOTE — Progress Notes (Signed)
[name] OFF[facility] PROGRESS NOTE  Patient Care Team: Boykin Nearing, MD as PCP - General (Family Medicine) [name], MD as Consulting Physician (Hematology and Oncology) [name], MD as Attending Physician (Radiation Oncology) Leota Sauers, RN as Oncology Nurse Navigator  SUMMARY OF ONCOLOGIC HISTORY: Oncology History   Diffuse large B cell lymphoma   Primary site: Lymphoid Neoplasms   Staging method: AJCC 6th Edition   Clinical: Stage II signed by [name], MD on 09/23/2013  5:06 PM   Summary: Stage II       History of B-cell lymphoma   09/10/2013 Imaging CT scan of the neck confirmed abnormal tonsil region with regional lymphadenopathy   09/13/2013 Procedure Accession: [id number] consult biopsy showed atypical cells   09/21/2013 Surgery He underwent tonsil biopsy and right lymph node excisional biopsy.   09/21/2013 Pathology Results Accession: [id number] biopsy from tonsil confirmed diffuse large B-cell lymphoma   09/24/2013 Imaging PET CT scan confirmed abnormal oropharyngeal mass with regional lymphadenopathy   09/26/2013 Bone Marrow Biopsy Bone marrow biopsy showed no involvement of lymphoma   10/07/2013 - 11/17/2013 Chemotherapy He received 3 cycles of R. CHOP chemotherapy with dose adjustment due to abnormal liver function test along with prophylactic intrathecal chemotherapy with methotrexate   11/15/2013 Imaging CT scan of the neck show no evidence of abscess. Prior lymphadenopathy has reduced in size.   12/15/2013 Imaging PET/CT scan showed near complete response to treatment.   01/11/2014 - 02/14/2014 Radiation Therapy The patient received radiation treatment   03/16/2014 Imaging PET CT scan showed complete response to treatment.   10/17/2014 Imaging CT neck was negative for recurrence    INTERVAL HISTORY: Please see below for problem oriented charting. He returns for further follow-up. He complained of leg swelling. He ran out of his blood pressure  medication last week.  REVIEW OF SYSTEMS:   Constitutional: Denies fevers, chills or abnormal weight loss Eyes: Denies blurriness of vision Ears, nose, mouth, throat, and face: Denies mucositis or sore throat Respiratory: Denies cough, dyspnea or wheezes Cardiovascular: Denies palpitation, chest discomfort or lower extremity swelling Gastrointestinal:  Denies nausea, heartburn or change in bowel habits Skin: Denies abnormal skin rashes Lymphatics: Denies new lymphadenopathy or easy bruising Neurological:Denies numbness, tingling or new weaknesses Behavioral/Psych: Mood is stable, no new changes  All other systems were reviewed with the patient and are negative.  I have reviewed the past medical history, past surgical history, social history and family history with the patient and they are unchanged from previous note.  ALLERGIES:  is allergic to penicillins.  MEDICATIONS:  Current Outpatient Prescriptions  Medication Sig Dispense Refill  . lisinopril (PRINIVIL,ZESTRIL) 20 MG tablet Take 1 tablet (20 mg total) by mouth daily. 90 tablet 0   No current facility-administered medications for this visit.    PHYSICAL EXAMINATION: ECOG PERFORMANCE STATUS: 1 - Symptomatic but completely ambulatory  Filed Vitals:   04/10/15 1001  BP: 160/80  Pulse: 60  Temp: 98.2 F (36.8 C)  Resp: 18   Filed Weights   04/10/15 1001  Weight: 204 lb 14.4 oz (92.942 kg)    GENERAL:alert, no distress and comfortable SKIN: skin color, texture, turgor are normal, no rashes or significant lesions EYES: normal, Conjunctiva are pink and non-injected, sclera clear OROPHARYNX:no exudate, no erythema and lips, buccal mucosa, and tongue normal  NECK: Significant lymphedema around his neck. Well-healed surgical scar.  LYMPH:  no palpable lymphadenopathy in the cervical, axillary or inguinal LUNGS: clear to auscultation and percussion with  normal breathing effort HEART: regular rate & rhythm and no murmurs  and no lower extremity edema ABDOMEN:abdomen soft, non-tender and normal bowel sounds Musculoskeletal:no cyanosis of digits and no clubbing  NEURO: alert & oriented x 3 with fluent speech, no focal motor/sensory deficits  LABORATORY DATA:  I have reviewed the data as listed    Component Value Date/Time   NA 142 04/10/2015 0947   NA 141 09/30/2013 0630   K 4.5 04/10/2015 0947   K 4.1 09/30/2013 0630   CL 102 09/30/2013 0630   CO2 29 04/10/2015 0947   CO2 29 09/30/2013 0630   GLUCOSE 88 04/10/2015 0947   GLUCOSE 71 09/30/2013 0630   BUN 15.8 04/10/2015 0947   BUN 17 09/30/2013 0630   CREATININE 1.2 04/10/2015 0947   CREATININE 1.00 09/30/2013 0630   CALCIUM 9.8 04/10/2015 0947   CALCIUM 8.8 09/30/2013 0630   PROT 8.1 04/10/2015 0947   PROT 7.1 09/30/2013 0630   ALBUMIN 4.0 04/10/2015 0947   ALBUMIN 2.6* 09/30/2013 0630   AST 30 04/10/2015 0947   AST 40* 09/30/2013 0630   ALT 44 04/10/2015 0947   ALT 109* 09/30/2013 0630   ALKPHOS 96 04/10/2015 0947   ALKPHOS 105 09/30/2013 0630   BILITOT 0.54 04/10/2015 0947   BILITOT 0.3 09/30/2013 0630   GFRNONAA 78* 09/30/2013 0630   GFRAA >90 09/30/2013 0630    No results found for: SPEP, UPEP  Lab Results  Component Value Date   WBC 9.1 04/10/2015   NEUTROABS 4.8 04/10/2015   HGB 16.0 04/10/2015   HCT 47.6 04/10/2015   MCV 88.1 04/10/2015   PLT 194 04/10/2015      Chemistry      Component Value Date/Time   NA 142 04/10/2015 0947   NA 141 09/30/2013 0630   K 4.5 04/10/2015 0947   K 4.1 09/30/2013 0630   CL 102 09/30/2013 0630   CO2 29 04/10/2015 0947   CO2 29 09/30/2013 0630   BUN 15.8 04/10/2015 0947   BUN 17 09/30/2013 0630   CREATININE 1.2 04/10/2015 0947   CREATININE 1.00 09/30/2013 0630      Component Value Date/Time   CALCIUM 9.8 04/10/2015 0947   CALCIUM 8.8 09/30/2013 0630   ALKPHOS 96 04/10/2015 0947   ALKPHOS 105 09/30/2013 0630   AST 30 04/10/2015 0947   AST 40* 09/30/2013 0630   ALT 44 04/10/2015  0947   ALT 109* 09/30/2013 0630   BILITOT 0.54 04/10/2015 0947   BILITOT 0.3 09/30/2013 0630      ASSESSMENT & PLAN:  History of B-cell lymphoma Clinically, he is doing well with normal blood work and without new symptoms. He has a lot of lymphedema around the neck with a persisting mass on the right side of the neck which is difficult to ascertain whether this could represent disease relapse. CT scan with contrast last year showed no evidence of cancer I plan to see him back next in 6 months with repeat history, physical examination and blood work.  Essential hypertension His blood pressures out of control as he ran out of his prescription recently. I refill his prescription. I recommend he follows closely with primary care doctor for medication refills in the future.  Lymphedema of face This is related to prior surgery and radiation and pockets of seroma I will discuss with the ENT physician to see if he would benefit from aspiration of seroma around his neck   Orders Placed This Encounter  Procedures  . CBC  with Differential/Platelet    Standing Status: Future     Number of Occurrences:      Standing Expiration Date: 05/14/2016  . Comprehensive metabolic panel    Standing Status: Future     Number of Occurrences:      Standing Expiration Date: 05/14/2016  . Lactate dehydrogenase (LDH)    Standing Status: Future     Number of Occurrences:      Standing Expiration Date: 05/14/2016   All questions were answered. The patient knows to call the clinic with any problems, questions or concerns. No barriers to learning was detected. I spent 15 minutes counseling the patient face to face. The total time spent in the appointment was 20 minutes and more than 50% was on counseling and review of test results     [facility], [place], MD 04/10/2015 2:50 PM

## 2015-04-10 NOTE — Assessment & Plan Note (Signed)
This is related to prior surgery and radiation and pockets of seroma I will discuss with the ENT physician to see if he would benefit from aspiration of seroma around his neck

## 2015-04-10 NOTE — Assessment & Plan Note (Signed)
His blood pressures out of control as he ran out of his prescription recently. I refill his prescription. I recommend he follows closely with primary care doctor for medication refills in the future.

## 2015-04-11 DIAGNOSIS — H04123 Dry eye syndrome of bilateral lacrimal glands: Secondary | ICD-10-CM | POA: Diagnosis not present

## 2015-04-11 DIAGNOSIS — H401133 Primary open-angle glaucoma, bilateral, severe stage: Secondary | ICD-10-CM | POA: Diagnosis not present

## 2015-04-11 DIAGNOSIS — H2513 Age-related nuclear cataract, bilateral: Secondary | ICD-10-CM | POA: Diagnosis not present

## 2015-04-11 DIAGNOSIS — H11012 Amyloid pterygium of left eye: Secondary | ICD-10-CM | POA: Diagnosis not present

## 2015-04-23 ENCOUNTER — Telehealth: Payer: Self-pay | Admitting: Family Medicine

## 2015-04-23 DIAGNOSIS — C8599 Non-Hodgkin lymphoma, unspecified, extranodal and solid organ sites: Secondary | ICD-10-CM

## 2015-04-23 NOTE — Telephone Encounter (Signed)
Patients daughter called stating that she called Dr. [facility]  [facility] where the patient was treated for cancer, the facility stated that the patient needs a referral first before they can treat him.  Please f/u with pt.

## 2015-04-25 NOTE — Telephone Encounter (Signed)
ENT referral placed.

## 2015-05-14 DIAGNOSIS — Z8579 Personal history of other malignant neoplasms of lymphoid, hematopoietic and related tissues: Secondary | ICD-10-CM | POA: Diagnosis not present

## 2015-05-14 DIAGNOSIS — R221 Localized swelling, mass and lump, neck: Secondary | ICD-10-CM | POA: Diagnosis not present

## 2015-05-14 DIAGNOSIS — I890 Lymphedema, not elsewhere classified: Secondary | ICD-10-CM | POA: Diagnosis not present

## 2015-05-14 DIAGNOSIS — Z923 Personal history of irradiation: Secondary | ICD-10-CM | POA: Diagnosis not present

## 2015-05-18 IMAGING — CT NM PET TUM IMG INITIAL (PI) SKULL BASE T - THIGH
8 series · 20 of 25 positions shown · non-contrast
Comparison: 09/10/2013

CLINICAL DATA: Initial treatment strategy for head and neck cancer.

EXAM:
NUCLEAR MEDICINE PET SKULL BASE TO THIGH
TECHNIQUE: 9.9 mCi F-18 FDG was injected intravenously. Full-ring PET imaging
was performed from the skull base to thigh after the radiotracer. CT
data was obtained and used for attenuation correction and anatomic
localization.
FASTING BLOOD GLUCOSE:  Value: 127 mg/dl

[Series 3: pet hn_sk_thigh ac · axial · 5.0mm · 4.07mm/px · z∈[-300,+612]mm · 4 of 229 slices shown]
[im 1/229]
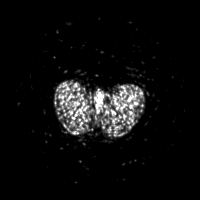
[im 58/229]
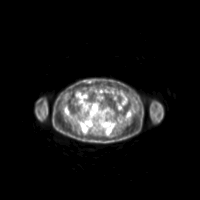
[im 172/229]
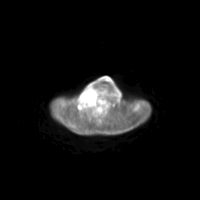
[im 229/229]
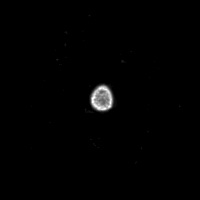

[Series 4: ct hn_sk_th 5.0 b31f · axial · 5.0mm · 0.98mm/px · z∈[-300,+612]mm · 4 of 229 slices shown]
[im 1/229]
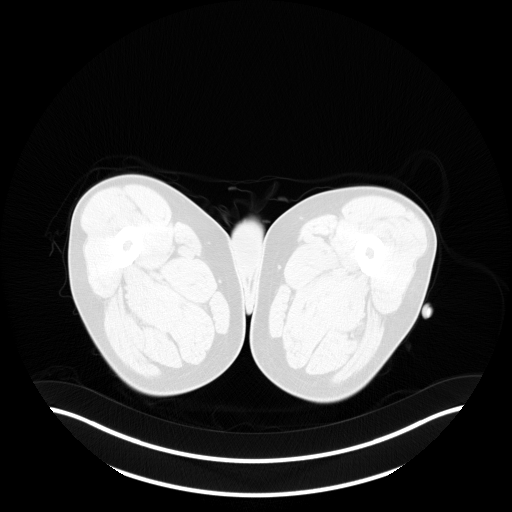
[im 58/229]
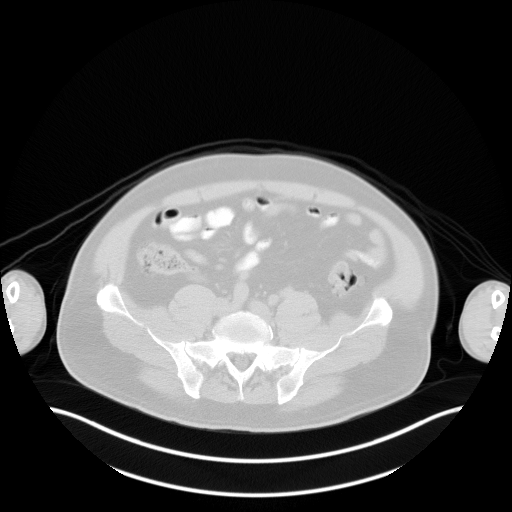
[im 172/229  brain]
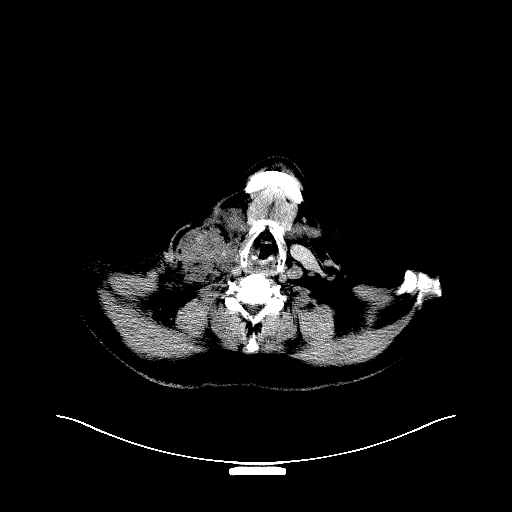
[im 229/229]
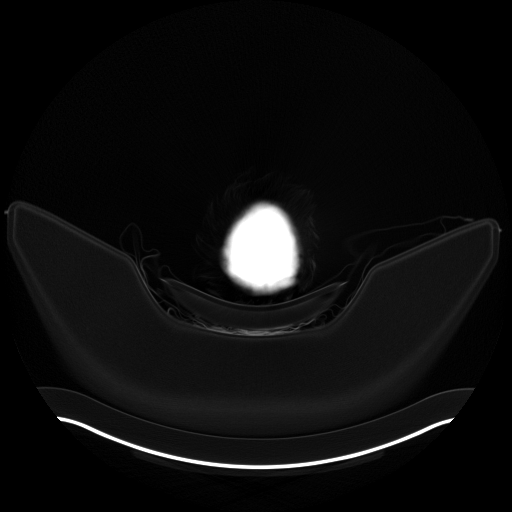

[Series 6: ct hn_sk_th 5.0 b70f lung_bone · axial · 5.0mm · 0.70mm/px · 1 of 64 slices shown]
[im 1/64  bone]
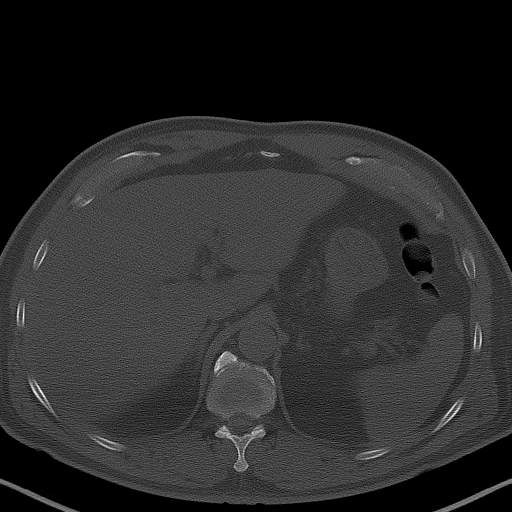

[Series 8: pet hn_sk_thigh nac · axial · 5.0mm · 4.07mm/px · z∈[-300,+612]mm · 4 of 229 slices shown]
[im 1/229]
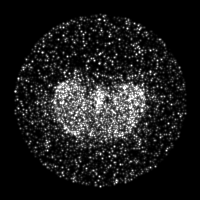
[im 115/229]
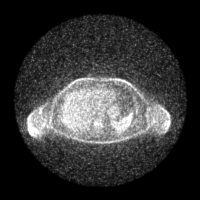
[im 172/229]
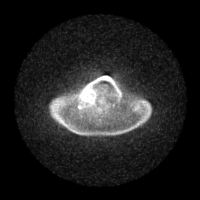
[im 229/229]
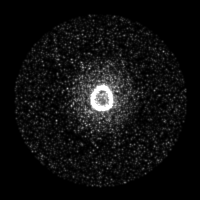

[Series 603: mip collection<mip range> · coronal · 1.89mm/px · 1 of 32 slices shown]
[im 1/32]
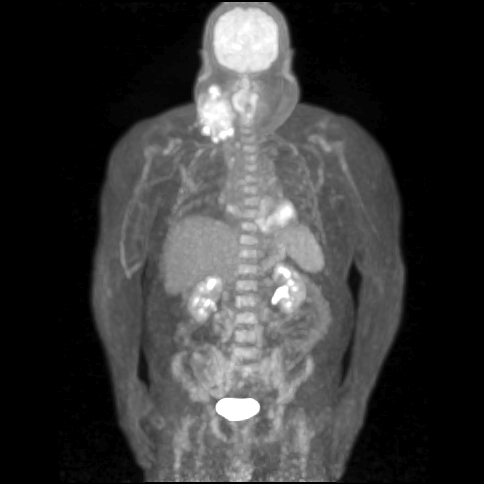

[Series 604: range-ct hn_sk_th 5.0 (id)<alpha range> · 1 of 79 slices shown (1 of 2)]
[im 79/79]
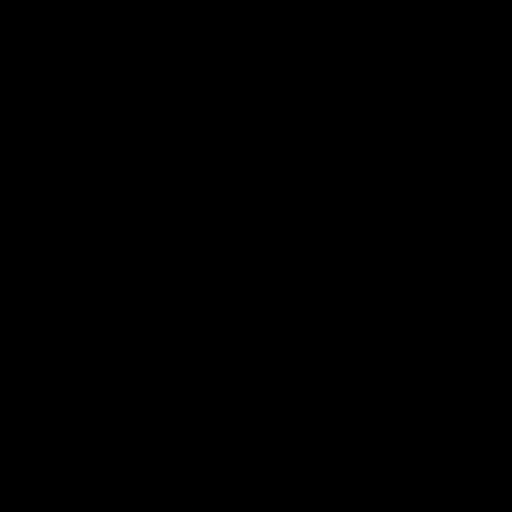

[Series 605: range-ct hn_sk_th 5.0 (id)<alpha range> · 4 of 223 slices shown (2 of 2)]
[im 1/223]
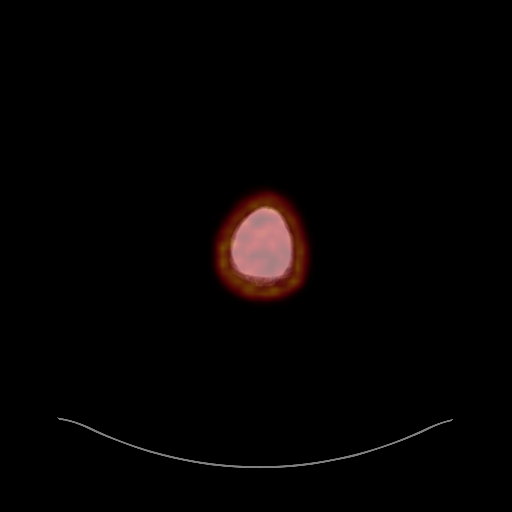
[im 56/223]
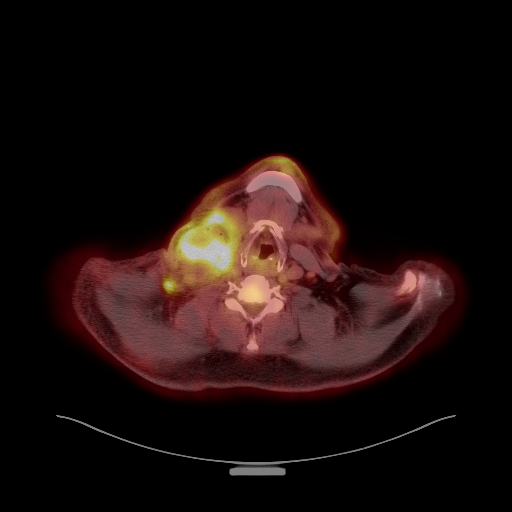
[im 112/223]
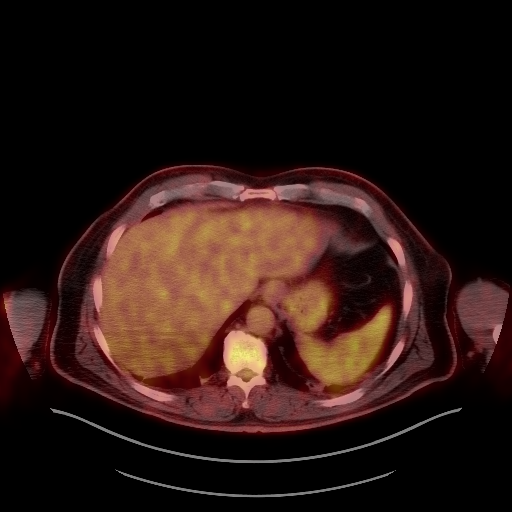
[im 223/223]
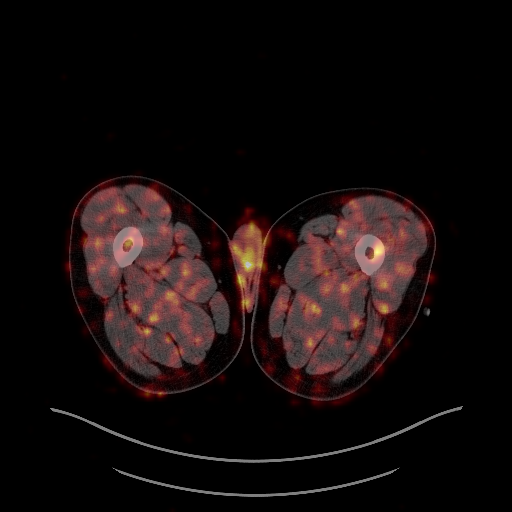

[Series 1032: results mm oncology reading · 0.45mm/px · 1 of 5 slices shown]
[im 1/5]
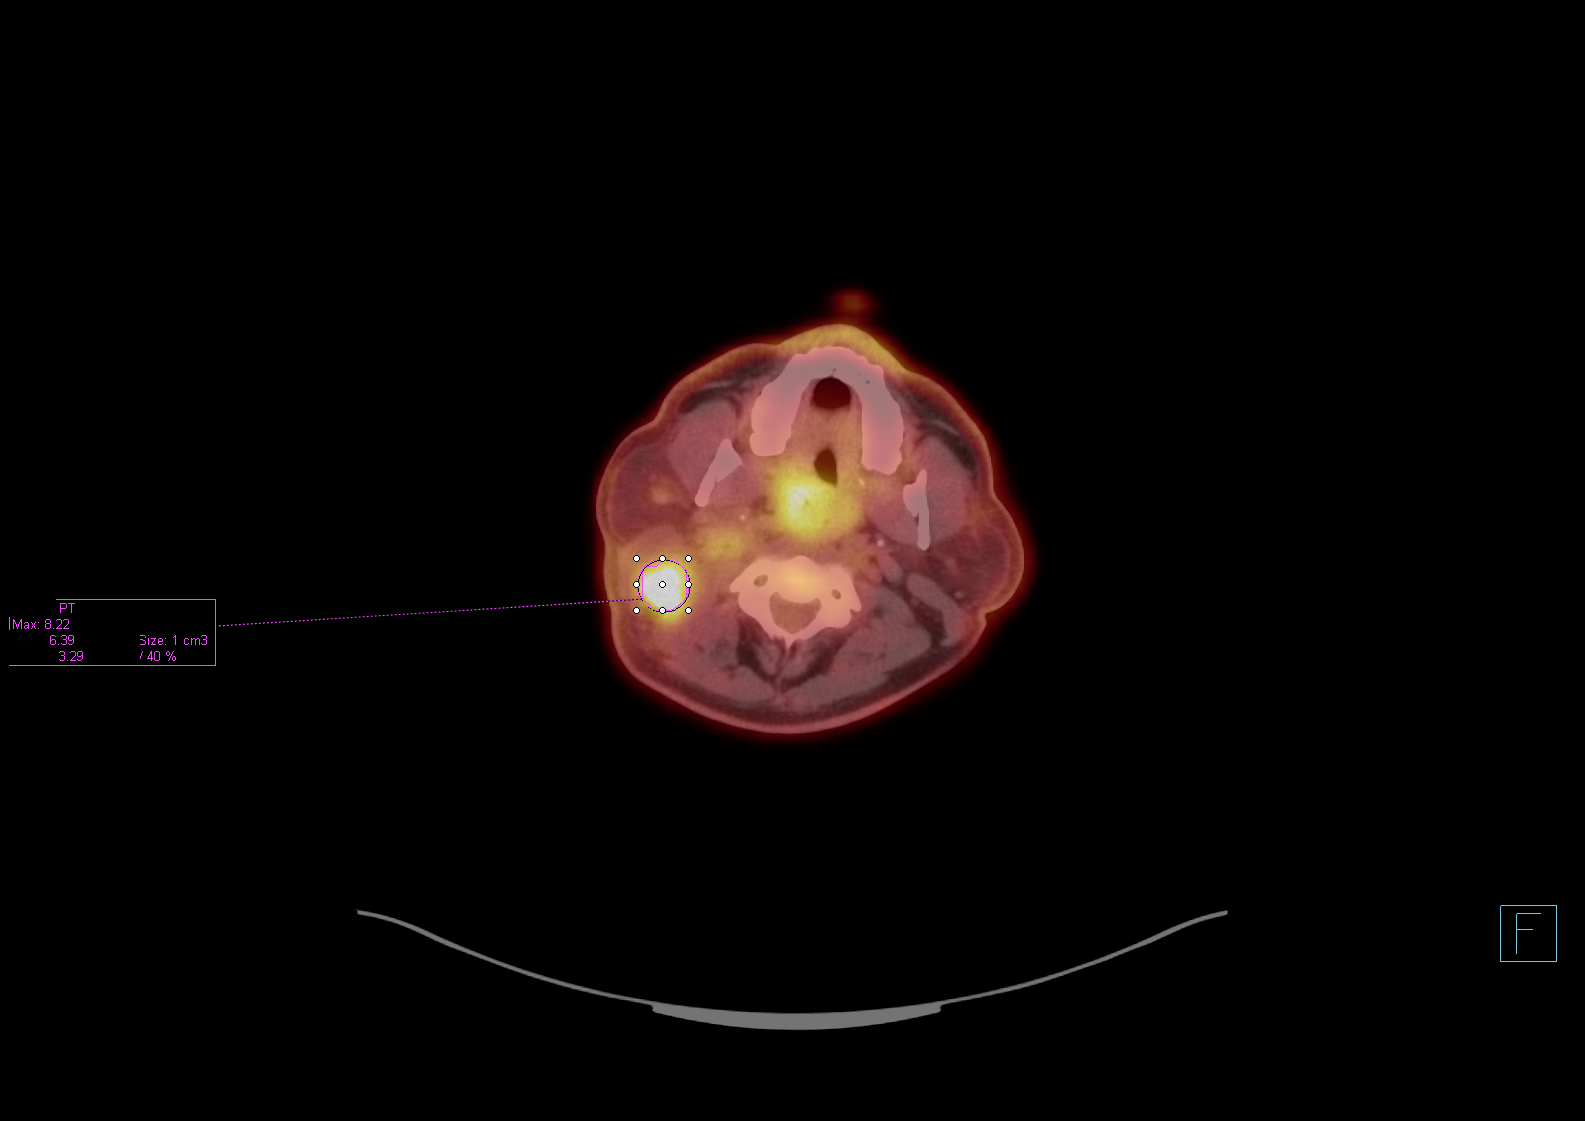

[20 of 25 positions shown; findings below may reference images not displayed]

FINDINGS: NECK

Extensive hyper metabolic right cervical adenopathy identified.
Index right level 2 lymph node measures 2.5 x 2.9 cm and has an SUV
max equal to 8.2. Large, necrotic right cervical level 3 lymph node
measures 4.2 x 4.6 cm and has an SUV max equal to 7.5. Low level 5
lymph node Measures 1.2 cm and has an SUV max equal to 6.7. No
hypermetabolic left-sided cervical adenopathy. Asymmetric increased
uptake within the enlarged right tonsil region has an SUV max equal
to 6.5.

CHEST

Single right paratracheal lymph node measures 8 mm and has an SUV
max equal to 3.8. No additional enlarged or hypermetabolic
mediastinal or hilar lymph nodes identified. No hypermetabolic
axillary adenopathy. Left lower lobe pulmonary nodule is too small
to characterize measuring 4 mm, image 33/series 6. Atelectasis noted
within both posterior lung bases.

ABDOMEN/PELVIS

No abnormal hypermetabolic activity within the liver, pancreas,
adrenal glands, or spleen. No hypermetabolic lymph nodes in the
abdomen or pelvis.

SKELETON

No focal hypermetabolic activity to suggest skeletal metastasis.
IMPRESSION: 1. Extensive enlarged, necrotic hypermetabolic cervical adenopathy
is identified.
2. Equivocal FDG uptake is identified associated with small right
paratracheal lymph node.
3. Asymmetric increased uptake localizes to the enlarged right
tonsillar region.
4. No evidence for malignant range FDG uptake within the abdomen or
pelvis or skeleton.

## 2015-05-22 ENCOUNTER — Ambulatory Visit (HOSPITAL_COMMUNITY)
Admission: EM | Admit: 2015-05-22 | Discharge: 2015-05-22 | Disposition: A | Discharge status: Home / Self Care | Payer: Medicare Other | Attending: Family Medicine | Admitting: Family Medicine

## 2015-05-22 ENCOUNTER — Encounter (HOSPITAL_COMMUNITY): Payer: Self-pay | Admitting: Emergency Medicine

## 2015-05-22 DIAGNOSIS — H6191 Disorder of right external ear, unspecified: Secondary | ICD-10-CM | POA: Diagnosis not present

## 2015-05-22 DIAGNOSIS — H60391 Other infective otitis externa, right ear: Secondary | ICD-10-CM

## 2015-05-22 MED ORDER — CLINDAMYCIN HCL 300 MG PO CAPS: 300.0000 mg | ORAL_CAPSULE | Freq: Three times a day (TID) | ORAL | Status: DC

## 2015-05-22 NOTE — Discharge Instructions (Signed)
Take the antibiotic as directed. If there is any worsening, increased swelling, fevers, swelling around the neck or other problems go to the emergency department promptly. Recommend following up with your PCP in 2 days. May return as needed if there are no changes or not appearing to improve in 2 days for a recheck.

## 2015-05-22 NOTE — ED Notes (Signed)
Seen by Janne Napoleon np only

## 2015-05-22 NOTE — ED Provider Notes (Signed)
CSN: [id number]     Arrival date & time 05/22/15  1711 History   First MD Initiated Contact with Patient 05/22/15 1855     No chief complaint on file.  (Consider location/radiation/quality/duration/timing/severity/associated sxs/prior Treatment) HPI Comments: 65 year old Spanish male is with his wife who speaks conversationally English well is complaining of swelling to the right ear. This started approximately 2 days ago. The outer ear is also tender as well as surrounding scalp and postauricular soft tissue. Denies in her ear pain or drainage. Denies sore throat or fever.   Past Medical History  Diagnosis Date  . Arthritis   . Cataract   . Depression   . Cancer ([place])   . Hard of hearing   . Lymphoma ([place])   . History of radiation therapy 01/11/14- 02/14/14    right tonsil, oropharynx, neck, upper paratracheal region/ 46 Gy/ 23 fx to high risk tissue; 41.4 Gy /23 fx to intermediate  risk tissue  . Poor dentition 03/17/2014  . Other fatigue 10/18/2014  . Acquired hypothyroidism 04/10/2015   Past Surgical History  Procedure Laterality Date  . Mass biopsy Right 09/21/2013    Procedure: EXCISIONAL BIOPSY RIGHT NECK NODE;  Surgeon: [name], MD;  Location: [facility];  Service: ENT;  Laterality: Right;  with frozen sections  . Tonsillectomy Right 09/21/2013    Procedure: RIGHT TONSILLECTOMY;  Surgeon: [name], MD;  Location: [facility];  Service: ENT;  Laterality: Right;  . Tonsillectomy     Family History  Problem Relation Age of Onset  . Cancer Mother     Gi cancer  . Diabetes Sister    Social History  Substance Use Topics  . Smoking status: Never Smoker   . Smokeless tobacco: Never Used  . Alcohol Use: No    Review of Systems  Constitutional: Negative for fever, activity change and fatigue.  HENT: Positive for ear pain. Negative for congestion, ear discharge, hearing loss, postnasal drip, rhinorrhea, sinus pressure and sore throat.        No itching to ear or face or scalp.    Eyes: Negative.   Respiratory: Negative.   Cardiovascular: Negative.   Skin: Negative for rash.    Allergies  Penicillins  Home Medications   Prior to Admission medications   Medication Sig Start Date End Date Taking? Authorizing Provider  clindamycin (CLEOCIN) 300 MG capsule Take 1 capsule (300 mg total) by mouth 3 (three) times daily. 05/22/15   Janne Napoleon, NP  lisinopril (PRINIVIL,ZESTRIL) 20 MG tablet Take 1 tablet (20 mg total) by mouth daily. 04/10/15   [name], MD   Meds Ordered and Administered this Visit  Medications - No data to display  BP 133/65 mmHg  Pulse 64  Temp(Src) 98.1 F (36.7 C) (Oral)  Resp 12  SpO2 99% No data found.   Physical Exam  Constitutional: He is oriented to person, place, and time. He appears well-developed and well-nourished. No distress.  HENT:  Head: Normocephalic and atraumatic.  Left Ear: External ear normal.  Mouth/Throat: Oropharynx is clear and moist. No oropharyngeal exudate.  The right outer ear with swelling and tenderness and minimallight erythema. There is also tenderness to the right parietal scalp immediately surrounding the outer ear as well as the postauricular soft tissues. No swelling is observed to the scalp or postauricular areas.No puffiness or fluctuance. There is swelling to the right face superior to the angle of the jaw. 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  Eyes: Conjunctivae and EOM are normal.  Neck: Normal range of motion. Neck supple.  Cardiovascular: Normal rate.   Pulmonary/Chest: Effort normal. No respiratory distress.  Musculoskeletal: He exhibits no edema.  Neurological: He is alert and oriented to person, place, and time. He exhibits normal muscle tone.  Skin: Skin is warm and dry.  Psychiatric: He has a normal mood and affect.  Nursing note and vitals reviewed.   ED Course  Procedures (including critical care  time)  Labs Review Labs Reviewed - No data to display  Imaging Review No results found.     Visual Acuity Review  Right Eye Distance:   Left Eye Distance:   Bilateral Distance:    Right Eye Near:   Left Eye Near:    Bilateral Near:         MDM   1. Pinna infection, acute, right    Take the antibiotic as directed. If there is any worsening, increased swelling, fevers, swelling around the neck or other problems go to the emergency department promptly. Recommend following up with your PCP in 2 days. May return as needed if there are no changes or not appearing to improve in 2 days for a recheck. Meds ordered this encounter  Medications  . clindamycin (CLEOCIN) 300 MG capsule    Sig: Take 1 capsule (300 mg total) by mouth 3 (three) times daily.    Dispense:  30 capsule    Refill:  0    Order Specific Question:  Supervising Provider    Answer:  Billy Fischer [[id number]]       Janne Napoleon, NP 05/22/15 [phone]

## 2015-05-23 ENCOUNTER — Other Ambulatory Visit: Payer: Self-pay | Admitting: Hematology and Oncology

## 2015-05-23 ENCOUNTER — Telehealth: Payer: Self-pay | Admitting: *Deleted

## 2015-05-23 DIAGNOSIS — H04123 Dry eye syndrome of bilateral lacrimal glands: Secondary | ICD-10-CM | POA: Diagnosis not present

## 2015-05-23 DIAGNOSIS — H401133 Primary open-angle glaucoma, bilateral, severe stage: Secondary | ICD-10-CM | POA: Diagnosis not present

## 2015-05-23 DIAGNOSIS — H6092 Unspecified otitis externa, left ear: Secondary | ICD-10-CM

## 2015-05-23 DIAGNOSIS — H11012 Amyloid pterygium of left eye: Secondary | ICD-10-CM | POA: Diagnosis not present

## 2015-05-23 DIAGNOSIS — H2513 Age-related nuclear cataract, bilateral: Secondary | ICD-10-CM | POA: Diagnosis not present

## 2015-05-23 MED ORDER — PREDNISONE 20 MG PO TABS: 20.0000 mg | ORAL_TABLET | Freq: Every day | ORAL | Status: DC

## 2015-05-23 MED ORDER — CIPROFLOXACIN HCL 0.2 % OT SOLN: 0.2000 mL | Freq: Two times a day (BID) | OTIC | Status: DC

## 2015-05-23 MED ORDER — OXYCODONE-ACETAMINOPHEN 5-325 MG PO TABS: 1.0000 | ORAL_TABLET | ORAL | Status: DC | PRN

## 2015-05-23 MED FILL — CIPROFLOXACIN 0.3% EYE DROP: 0.3 | 7 days supply | Qty: 5 | Fill #0

## 2015-05-23 MED FILL — predniSONE 20 MG TABS: 20 | 7 days supply | Qty: 7 | Fill #0

## 2015-05-23 NOTE — Telephone Encounter (Signed)
Pt's family called Spanish language interpreter, Almyra Free, regarding ear infection and pain not relieved by tylenol.   Dr. Alvy Bimler signed Rx for percocet and it is ready to pick up.   Almyra Free notified family of ready and will need to be picked up in office.

## 2015-07-13 ENCOUNTER — Other Ambulatory Visit: Payer: Self-pay | Admitting: Hematology and Oncology

## 2015-07-13 ENCOUNTER — Telehealth: Payer: Self-pay | Admitting: Family Medicine

## 2015-07-13 MED ORDER — LISINOPRIL 20 MG PO TABS: 20.0000 mg | ORAL_TABLET | Freq: Every day | ORAL | Status: DC

## 2015-07-13 MED FILL — LISINOPRIL 20 MG TABLET: 20 | 30 days supply | Qty: 30 | Fill #0

## 2015-07-13 NOTE — Telephone Encounter (Signed)
Lisinopril refilled - patient must have office visit for further refills.

## 2015-07-13 NOTE — Telephone Encounter (Signed)
Patient needs a refill for lisinopril

## 2015-07-16 NOTE — Telephone Encounter (Signed)
Per prior discussion he needs PCP visit

## 2015-07-19 ENCOUNTER — Other Ambulatory Visit: Payer: Self-pay | Admitting: Hematology and Oncology

## 2015-08-03 ENCOUNTER — Telehealth: Payer: Self-pay | Admitting: *Deleted

## 2015-08-03 NOTE — Telephone Encounter (Signed)
  Oncology Nurse Navigator Documentation  Spoke with [name] daughter.  She confirmed he recently had an appt with his PCP, received Rx for BP.  Gayleen Orem, RN, BSN, [place] at [facility] [phone]  Navigator Location: [place] [phone]/28/17 1707) Navigator Encounter Type: Telephone (08/03/15 1707) Telephone: Jerilee Hoh Confirmation/Clarification;Medication Assistance (08/03/15 1707)                                        Time Spent with Patient: 15 (08/03/15 1707)

## 2015-08-03 NOTE — Telephone Encounter (Signed)
  Oncology Nurse Navigator Documentation   Called patient, son and daughter at listed numbers re need for PCP, no answer and unable to leave VM.  Gayleen Orem, RN, BSN, [place] at [facility] [phone]   Navigator Location: [facility] [phone]/28/17 1350) Navigator Encounter Type: Telephone (08/03/15 1350)                                          Time Spent with Patient: 15 (08/03/15 1350)

## 2015-08-03 NOTE — Telephone Encounter (Signed)
  Oncology Nurse Navigator Documentation  Called Spanish interpreter Benjamine Sprague, asked for her assistance in speaking with patient or a family member about the need to schedule an appt with his PCP to address his BP and needed medication.  She agreed to call me when she contacted him.  Gayleen Orem, RN, BSN, [place] at [facility] [phone]   Navigator Location: [place] [phone]/28/17 1415) Navigator Encounter Type: Telephone (08/03/15 1415) Telephone: Outgoing Call (08/03/15 1415)                                        Time Spent with Patient: 15 (08/03/15 1415)

## 2015-08-22 ENCOUNTER — Other Ambulatory Visit: Payer: Self-pay | Admitting: Hematology and Oncology

## 2015-08-22 MED FILL — LISINOPRIL 20 MG TABLET: 20 | 30 days supply | Qty: 30 | Fill #0

## 2015-09-05 DIAGNOSIS — H2513 Age-related nuclear cataract, bilateral: Secondary | ICD-10-CM | POA: Diagnosis not present

## 2015-09-05 DIAGNOSIS — H04123 Dry eye syndrome of bilateral lacrimal glands: Secondary | ICD-10-CM | POA: Diagnosis not present

## 2015-09-05 DIAGNOSIS — H401133 Primary open-angle glaucoma, bilateral, severe stage: Secondary | ICD-10-CM | POA: Diagnosis not present

## 2015-09-05 DIAGNOSIS — H11012 Amyloid pterygium of left eye: Secondary | ICD-10-CM | POA: Diagnosis not present

## 2015-09-20 ENCOUNTER — Other Ambulatory Visit: Payer: Self-pay | Admitting: Hematology and Oncology

## 2015-10-02 ENCOUNTER — Other Ambulatory Visit: Payer: Self-pay | Admitting: Hematology and Oncology

## 2015-10-09 ENCOUNTER — Telehealth: Payer: Self-pay | Admitting: Hematology and Oncology

## 2015-10-09 ENCOUNTER — Ambulatory Visit (HOSPITAL_BASED_OUTPATIENT_CLINIC_OR_DEPARTMENT_OTHER): Payer: Medicare Other | Admitting: Hematology and Oncology

## 2015-10-09 ENCOUNTER — Other Ambulatory Visit (HOSPITAL_BASED_OUTPATIENT_CLINIC_OR_DEPARTMENT_OTHER): Payer: Medicare Other

## 2015-10-09 ENCOUNTER — Other Ambulatory Visit: Payer: Self-pay | Admitting: *Deleted

## 2015-10-09 ENCOUNTER — Encounter: Payer: Self-pay | Admitting: Hematology and Oncology

## 2015-10-09 VITALS — BP 154/75 | HR 52 | Temp 98.1°F | Resp 17 | Ht 61.0 in | Wt 207.4 lb

## 2015-10-09 DIAGNOSIS — C8331 Diffuse large B-cell lymphoma, lymph nodes of head, face, and neck: Secondary | ICD-10-CM

## 2015-10-09 DIAGNOSIS — I10 Essential (primary) hypertension: Secondary | ICD-10-CM | POA: Diagnosis not present

## 2015-10-09 DIAGNOSIS — I890 Lymphedema, not elsewhere classified: Secondary | ICD-10-CM | POA: Diagnosis not present

## 2015-10-09 DIAGNOSIS — E039 Hypothyroidism, unspecified: Secondary | ICD-10-CM | POA: Diagnosis not present

## 2015-10-09 DIAGNOSIS — Z8572 Personal history of non-Hodgkin lymphomas: Secondary | ICD-10-CM

## 2015-10-09 DIAGNOSIS — C8599 Non-Hodgkin lymphoma, unspecified, extranodal and solid organ sites: Secondary | ICD-10-CM

## 2015-10-09 LAB — COMPREHENSIVE METABOLIC PANEL
ALT: 51 U/L (ref 0–55)
AST: 33 U/L (ref 5–34)
Albumin: 3.8 g/dL (ref 3.5–5.0)
Alkaline Phosphatase: 96 U/L (ref 40–150)
Anion Gap: 8 mEq/L (ref 3–11)
BUN: 16.7 mg/dL (ref 7.0–26.0)
CHLORIDE: 104 meq/L (ref 98–109)
CO2: 28 meq/L (ref 22–29)
CREATININE: 1.2 mg/dL (ref 0.7–1.3)
Calcium: 9.4 mg/dL (ref 8.4–10.4)
EGFR: 66 mL/min/{1.73_m2} — ABNORMAL LOW (ref >90)
GLUCOSE: 115 mg/dL (ref 70–140)
POTASSIUM: 4.4 meq/L (ref 3.5–5.1)
SODIUM: 140 meq/L (ref 136–145)
Total Bilirubin: 0.68 mg/dL (ref 0.20–1.20)
Total Protein: 8 g/dL (ref 6.4–8.3)

## 2015-10-09 LAB — CBC WITH DIFFERENTIAL/PLATELET
BASO%: 0.5 % (ref 0.0–2.0)
BASOS ABS: 0 10*3/uL (ref 0.0–0.1)
EOS%: 1.8 % (ref 0.0–7.0)
Eosinophils Absolute: 0.2 10*3/uL (ref 0.0–0.5)
HCT: 46.9 % (ref 38.4–49.9)
HGB: 15.8 g/dL (ref 13.0–17.1)
LYMPH%: 28.1 % (ref 14.0–49.0)
MCH: 29.6 pg (ref 27.2–33.4)
MCHC: 33.6 g/dL (ref 32.0–36.0)
MCV: 88.2 fL (ref 79.3–98.0)
MONO#: 0.9 10*3/uL (ref 0.1–0.9)
MONO%: 10.6 % (ref 0.0–14.0)
NEUT#: 4.8 10*3/uL (ref 1.5–6.5)
NEUT%: 59 % (ref 39.0–75.0)
Platelets: 195 10*3/uL (ref 140–400)
RBC: 5.32 10*6/uL (ref 4.20–5.82)
RDW: 13.8 % (ref 11.0–14.6)
WBC: 8.2 10*3/uL (ref 4.0–10.3)
lymph#: 2.3 10*3/uL (ref 0.9–3.3)

## 2015-10-09 LAB — TSH: TSH: 6.938 m(IU)/L — ABNORMAL HIGH (ref 0.320–4.118)

## 2015-10-09 LAB — LACTATE DEHYDROGENASE: LDH: 182 U/L (ref 125–245)

## 2015-10-09 MED ORDER — LEVOTHYROXINE SODIUM 25 MCG PO TABS: 25.0000 ug | ORAL_TABLET | Freq: Every day | ORAL | 2 refills | Status: DC

## 2015-10-09 MED ORDER — LISINOPRIL 20 MG PO TABS: 20.0000 mg | ORAL_TABLET | Freq: Every day | ORAL | 11 refills | Status: DC

## 2015-10-09 NOTE — Assessment & Plan Note (Signed)
Clinically, he is doing well with normal blood work and without new symptoms. He has a lot of lymphedema around the neck with a persisting mass on the right side of the neck which is difficult to ascertain whether this could represent disease relapse. CT scan with contrast last year showed no evidence of cancer I plan to see him back next in 6 months with repeat history, physical examination and blood work.

## 2015-10-09 NOTE — Assessment & Plan Note (Signed)
He has gained some weight. I will check his TSH to exclude hypothyroidism.

## 2015-10-09 NOTE — Progress Notes (Signed)
[facility] OFFICE PROGRESS NOTE  Patient Care Team: Boykin Nearing, MD as PCP - General (Family Medicine) [name], MD as Consulting Physician (Hematology and Oncology) [name], MD as Attending Physician (Radiation Oncology) Leota Sauers, RN as Oncology Nurse Navigator  SUMMARY OF ONCOLOGIC HISTORY: Oncology History   Diffuse large B cell lymphoma   Primary site: Lymphoid Neoplasms   Staging method: AJCC 6th Edition   Clinical: Stage II signed by [name], MD on 09/23/2013  5:06 PM   Summary: Stage II       History of B-cell lymphoma   09/10/2013 Imaging    CT scan of the neck confirmed abnormal tonsil region with regional lymphadenopathy      09/13/2013 Procedure    Accession: [id number] consult biopsy showed atypical cells      09/21/2013 Surgery    He underwent tonsil biopsy and right lymph node excisional biopsy.      09/21/2013 Pathology Results    Accession: [id number] biopsy from tonsil confirmed diffuse large B-cell lymphoma      09/24/2013 Imaging    PET CT scan confirmed abnormal oropharyngeal mass with regional lymphadenopathy      09/26/2013 Bone Marrow Biopsy    Bone marrow biopsy showed no involvement of lymphoma      10/07/2013 - 11/17/2013 Chemotherapy    He received 3 cycles of R. CHOP chemotherapy with dose adjustment due to abnormal liver function test along with prophylactic intrathecal chemotherapy with methotrexate      11/15/2013 Imaging    CT scan of the neck show no evidence of abscess. Prior lymphadenopathy has reduced in size.      12/15/2013 Imaging    PET/CT scan showed near complete response to treatment.      01/11/2014 - 02/14/2014 Radiation Therapy    The patient received radiation treatment      03/16/2014 Imaging    PET CT scan showed complete response to treatment.      10/17/2014 Imaging    CT neck was negative for recurrence       INTERVAL HISTORY: Please see below for problem oriented  charting. He feels well. Denies recent infection. He has not been able to get to see his primary care doctor for medication management for blood pressure. He ran out of his prescription last week. He continues to have persistent swelling on the right side of the neck, unchanged. No other lymphadenopathy. I noticed that he has gained weight recently He denies dysphagia. Has persistent dry mouth since radiation  REVIEW OF SYSTEMS:   Constitutional: Denies fevers, chills or abnormal weight loss Eyes: Denies blurriness of vision Ears, nose, mouth, throat, and face: Denies mucositis or sore throat Respiratory: Denies cough, dyspnea or wheezes Cardiovascular: Denies palpitation, chest discomfort or lower extremity swelling Gastrointestinal:  Denies nausea, heartburn or change in bowel habits Skin: Denies abnormal skin rashes Lymphatics: Denies new lymphadenopathy or easy bruising Neurological:Denies numbness, tingling or new weaknesses Behavioral/Psych: Mood is stable, no new changes  All other systems were reviewed with the patient and are negative.  I have reviewed the past medical history, past surgical history, social history and family history with the patient and they are unchanged from previous note.  ALLERGIES:  is allergic to penicillin g and penicillins.  MEDICATIONS:  Current Outpatient Prescriptions  Medication Sig Dispense Refill  . lisinopril (PRINIVIL,ZESTRIL) 20 MG tablet Take 1 tablet (20 mg total) by mouth daily. 90 tablet 11  . oxyCODONE-acetaminophen (PERCOCET/ROXICET) 5-325 MG tablet Take  1 tablet by mouth every 4 (four) hours as needed for severe pain. (Patient not taking: Reported on 10/09/2015) 30 tablet 0   No current facility-administered medications for this visit.     PHYSICAL EXAMINATION: ECOG PERFORMANCE STATUS: 0 - Asymptomatic  Vitals:   10/09/15 0951  BP: (!) 154/75  Pulse: (!) 52  Resp: 17  Temp: 98.1 F (36.7 C)   Filed Weights   10/09/15  0951  Weight: 207 lb 6.4 oz (94.1 kg)    GENERAL:alert, no distress and comfortable. He is obese SKIN: skin color, texture, turgor are normal, no rashes or significant lesions EYES: normal, Conjunctiva are pink and non-injected, sclera clear OROPHARYNX:no exudate, no erythema and lips, buccal mucosa, and tongue normal  NECK: He has persistent lymphedema around his neck  LYMPH:  no palpable lymphadenopathy in the cervical, axillary or inguinal LUNGS: clear to auscultation and percussion with normal breathing effort HEART: regular rate & rhythm and no murmurs and no lower extremity edema ABDOMEN:abdomen soft, non-tender and normal bowel sounds Musculoskeletal:no cyanosis of digits and no clubbing  NEURO: alert & oriented x 3 with fluent speech, no focal motor/sensory deficits  LABORATORY DATA:  I have reviewed the data as listed    Component Value Date/Time   NA 140 10/09/2015 0935   K 4.4 10/09/2015 0935   CL 102 09/30/2013 0630   CO2 28 10/09/2015 0935   GLUCOSE 115 10/09/2015 0935   BUN 16.7 10/09/2015 0935   CREATININE 1.2 10/09/2015 0935   CALCIUM 9.4 10/09/2015 0935   PROT 8.0 10/09/2015 0935   ALBUMIN 3.8 10/09/2015 0935   AST 33 10/09/2015 0935   ALT 51 10/09/2015 0935   ALKPHOS 96 10/09/2015 0935   BILITOT 0.68 10/09/2015 0935   GFRNONAA 78 (L) 09/30/2013 0630   GFRAA >90 09/30/2013 0630    No results found for: SPEP, UPEP  Lab Results  Component Value Date   WBC 8.2 10/09/2015   NEUTROABS 4.8 10/09/2015   HGB 15.8 10/09/2015   HCT 46.9 10/09/2015   MCV 88.2 10/09/2015   PLT 195 10/09/2015      Chemistry      Component Value Date/Time   NA 140 10/09/2015 0935   K 4.4 10/09/2015 0935   CL 102 09/30/2013 0630   CO2 28 10/09/2015 0935   BUN 16.7 10/09/2015 0935   CREATININE 1.2 10/09/2015 0935      Component Value Date/Time   CALCIUM 9.4 10/09/2015 0935   ALKPHOS 96 10/09/2015 0935   AST 33 10/09/2015 0935   ALT 51 10/09/2015 0935   BILITOT 0.68  10/09/2015 0935       ASSESSMENT & PLAN:  History of B-cell lymphoma Clinically, he is doing well with normal blood work and without new symptoms. He has a lot of lymphedema around the neck with a persisting mass on the right side of the neck which is difficult to ascertain whether this could represent disease relapse. CT scan with contrast last year showed no evidence of cancer I plan to see him back next in 6 months with repeat history, physical examination and blood work.  Essential hypertension The patient has lost insurance coverage recently. 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 I will check his TSH to  exclude hypothyroidism.   Orders Placed This Encounter  Procedures  . CBC with Differential/Platelet    Standing Status:   Future    Standing Expiration Date:   11/12/2016  . Comprehensive metabolic panel    Standing Status:   Future    Standing Expiration Date:   11/12/2016  . TSH    Standing Status:   Future    Standing Expiration Date:   11/12/2016   All questions were answered. The patient knows to call the clinic with any problems, questions or concerns. No barriers to learning was detected. I spent 15 minutes counseling the patient face to face. The total time spent in the appointment was 20 minutes and more than 50% was on counseling and review of test results     [name], MD 10/09/2015 10:40 AM

## 2015-10-09 NOTE — Telephone Encounter (Signed)
Daughter notified of new prescription synthroid.

## 2015-10-09 NOTE — Telephone Encounter (Signed)
Gave patient avs report and appointments for April  °

## 2015-10-09 NOTE — Assessment & Plan Note (Signed)
This is related to prior surgery and radiation and pockets of seroma Continue close observation only

## 2015-10-09 NOTE — Assessment & Plan Note (Signed)
The patient has lost insurance coverage recently. I refill his prescription for lisinopril I reinforced the importance of close follow-up with his primary care doctor for blood pressure management

## 2015-10-10 LAB — T4, FREE: T4,Free(Direct): 0.87 ng/dL (ref 0.82–1.77)

## 2016-03-10 MED FILL — LISINOPRIL 20 MG TABLET: 20 | 90 days supply | Qty: 90 | Fill #0

## 2016-04-07 ENCOUNTER — Telehealth: Payer: Self-pay

## 2016-04-07 ENCOUNTER — Ambulatory Visit (HOSPITAL_BASED_OUTPATIENT_CLINIC_OR_DEPARTMENT_OTHER): Payer: Medicare Other | Admitting: Hematology and Oncology

## 2016-04-07 ENCOUNTER — Other Ambulatory Visit (HOSPITAL_BASED_OUTPATIENT_CLINIC_OR_DEPARTMENT_OTHER): Payer: Medicare Other

## 2016-04-07 ENCOUNTER — Other Ambulatory Visit: Payer: Self-pay

## 2016-04-07 ENCOUNTER — Encounter: Payer: Self-pay | Admitting: Hematology and Oncology

## 2016-04-07 ENCOUNTER — Telehealth: Payer: Self-pay | Admitting: Hematology and Oncology

## 2016-04-07 VITALS — BP 148/61 | HR 53 | Temp 97.8°F | Resp 20 | Ht 61.0 in | Wt 210.1 lb

## 2016-04-07 DIAGNOSIS — E039 Hypothyroidism, unspecified: Secondary | ICD-10-CM | POA: Diagnosis not present

## 2016-04-07 DIAGNOSIS — Z8572 Personal history of non-Hodgkin lymphomas: Secondary | ICD-10-CM | POA: Diagnosis not present

## 2016-04-07 DIAGNOSIS — I10 Essential (primary) hypertension: Secondary | ICD-10-CM

## 2016-04-07 DIAGNOSIS — C8599 Non-Hodgkin lymphoma, unspecified, extranodal and solid organ sites: Secondary | ICD-10-CM

## 2016-04-07 LAB — CBC WITH DIFFERENTIAL/PLATELET
BASO%: 0.6 % (ref 0.0–2.0)
BASOS ABS: 0.1 10*3/uL (ref 0.0–0.1)
EOS%: 1.6 % (ref 0.0–7.0)
Eosinophils Absolute: 0.1 10*3/uL (ref 0.0–0.5)
HEMATOCRIT: 48.2 % (ref 38.4–49.9)
HGB: 16.3 g/dL (ref 13.0–17.1)
LYMPH#: 3 10*3/uL (ref 0.9–3.3)
LYMPH%: 34.4 % (ref 14.0–49.0)
MCH: 30 pg (ref 27.2–33.4)
MCHC: 33.8 g/dL (ref 32.0–36.0)
MCV: 88.9 fL (ref 79.3–98.0)
MONO#: 0.8 10*3/uL (ref 0.1–0.9)
MONO%: 9.8 % (ref 0.0–14.0)
NEUT#: 4.6 10*3/uL (ref 1.5–6.5)
NEUT%: 53.6 % (ref 39.0–75.0)
Platelets: 205 10*3/uL (ref 140–400)
RBC: 5.42 10*6/uL (ref 4.20–5.82)
RDW: 13.3 % (ref 11.0–14.6)
WBC: 8.7 10*3/uL (ref 4.0–10.3)

## 2016-04-07 LAB — COMPREHENSIVE METABOLIC PANEL
ALBUMIN: 3.9 g/dL (ref 3.5–5.0)
ALK PHOS: 101 U/L (ref 40–150)
ALT: 41 U/L (ref 0–55)
AST: 25 U/L (ref 5–34)
Anion Gap: 9 mEq/L (ref 3–11)
BUN: 15.6 mg/dL (ref 7.0–26.0)
CALCIUM: 9.6 mg/dL (ref 8.4–10.4)
CO2: 25 mEq/L (ref 22–29)
Chloride: 106 mEq/L (ref 98–109)
Creatinine: 1.2 mg/dL (ref 0.7–1.3)
EGFR: 64 mL/min/{1.73_m2} — AB (ref >90)
Glucose: 105 mg/dl (ref 70–140)
POTASSIUM: 4.3 meq/L (ref 3.5–5.1)
SODIUM: 140 meq/L (ref 136–145)
Total Bilirubin: 0.38 mg/dL (ref 0.20–1.20)
Total Protein: 7.5 g/dL (ref 6.4–8.3)

## 2016-04-07 LAB — TSH: TSH: 6.79 m[IU]/L — AB (ref 0.320–4.118)

## 2016-04-07 MED ORDER — LEVOTHYROXINE SODIUM 50 MCG PO TABS: 50.0000 ug | ORAL_TABLET | Freq: Every day | ORAL | 6 refills | Status: DC

## 2016-04-07 NOTE — Telephone Encounter (Signed)
Appointments scheduled per 4.2.18 LOS. Patient given AVS report and calendars with future scheduled appointments. °

## 2016-04-07 NOTE — Telephone Encounter (Signed)
Called daughter with below message. Verbalized understanding. 

## 2016-04-07 NOTE — Progress Notes (Signed)
[name] OFFICE PROGRESS NOTE  Patient Care Team: Boykin Nearing, MD as PCP - General (Family Medicine) [name], MD as Consulting Physician (Hematology and Oncology) [name], MD as Attending Physician (Radiation Oncology) Leota Sauers, RN as Oncology Nurse Navigator  SUMMARY OF ONCOLOGIC HISTORY: Oncology History   Diffuse large B cell lymphoma   Primary site: Lymphoid Neoplasms   Staging method: AJCC 6th Edition   Clinical: Stage II signed by [name], MD on 09/23/2013  5:06 PM   Summary: Stage II       History of B-cell lymphoma   09/10/2013 Imaging    CT scan of the neck confirmed abnormal tonsil region with regional lymphadenopathy      09/13/2013 Procedure    Accession: [id number] consult biopsy showed atypical cells      09/21/2013 Surgery    He underwent tonsil biopsy and right lymph node excisional biopsy.      09/21/2013 Pathology Results    Accession: [id number] biopsy from tonsil confirmed diffuse large B-cell lymphoma      09/24/2013 Imaging    PET CT scan confirmed abnormal oropharyngeal mass with regional lymphadenopathy      09/26/2013 Bone Marrow Biopsy    Bone marrow biopsy showed no involvement of lymphoma      10/07/2013 - 11/17/2013 Chemotherapy    He received 3 cycles of R. CHOP chemotherapy with dose adjustment due to abnormal liver function test along with prophylactic intrathecal chemotherapy with methotrexate      11/15/2013 Imaging    CT scan of the neck show no evidence of abscess. Prior lymphadenopathy has reduced in size.      12/15/2013 Imaging    PET/CT scan showed near complete response to treatment.      01/11/2014 - 02/14/2014 Radiation Therapy    The patient received radiation treatment      03/16/2014 Imaging    PET CT scan showed complete response to treatment.      10/17/2014 Imaging    CT neck was negative for recurrence       INTERVAL HISTORY: Please see below for problem oriented  charting. He returns with his daughter today. He complained of some fatigue He has chronic neck swelling since radiation treatment. No recent infection No lymphadenopathy Denies dysphagia  REVIEW OF SYSTEMS:   Constitutional: Denies fevers, chills or abnormal weight loss Eyes: Denies blurriness of vision Ears, nose, mouth, throat, and face: Denies mucositis or sore throat Respiratory: Denies cough, dyspnea or wheezes Cardiovascular: Denies palpitation, chest discomfort or lower extremity swelling Gastrointestinal:  Denies nausea, heartburn or change in bowel habits Skin: Denies abnormal skin rashes Lymphatics: Denies new lymphadenopathy or easy bruising Neurological:Denies numbness, tingling or new weaknesses Behavioral/Psych: Mood is stable, no new changes  All other systems were reviewed with the patient and are negative.  I have reviewed the past medical history, past surgical history, social history and family history with the patient and they are unchanged from previous note.  ALLERGIES:  is allergic to penicillin g and penicillins.  MEDICATIONS:  Current Outpatient Prescriptions  Medication Sig Dispense Refill  . levothyroxine (SYNTHROID, LEVOTHROID) 25 MCG tablet Take 1 tablet (25 mcg total) by mouth daily before breakfast. 90 tablet 2  . lisinopril (PRINIVIL,ZESTRIL) 20 MG tablet Take 1 tablet (20 mg total) by mouth daily. 90 tablet 11  . oxyCODONE-acetaminophen (PERCOCET/ROXICET) 5-325 MG tablet Take 1 tablet by mouth every 4 (four) hours as needed for severe pain. (Patient not taking: Reported on 10/09/2015)  30 tablet 0   No current facility-administered medications for this visit.     PHYSICAL EXAMINATION: ECOG PERFORMANCE STATUS: 1 - Symptomatic but completely ambulatory  Vitals:   04/07/16 0953  BP: (!) 148/61  Pulse: (!) 53  Resp: 20  Temp: 97.8 F (36.6 C)   Filed Weights   04/07/16 0953  Weight: 210 lb 1.6 oz (95.3 kg)    GENERAL:alert, no distress  and comfortable.  He is obese SKIN: skin color, texture, turgor are normal, no rashes or significant lesions EYES: normal, Conjunctiva are pink and non-injected, sclera clear OROPHARYNX:no exudate, no erythema and lips, buccal mucosa, and tongue normal  NECK: Neck is thickened from prior radiation with some mild lymphedema.  LYMPH:  no palpable lymphadenopathy in the cervical, axillary or inguinal LUNGS: clear to auscultation and percussion with normal breathing effort HEART: regular rate & rhythm and no murmurs and no lower extremity edema ABDOMEN:abdomen soft, non-tender and normal bowel sounds Musculoskeletal:no cyanosis of digits and no clubbing  NEURO: alert & oriented x 3 with fluent speech, no focal motor/sensory deficits  LABORATORY DATA:  I have reviewed the data as listed    Component Value Date/Time   NA 140 04/07/2016 0932   K 4.3 04/07/2016 0932   CL 102 09/30/2013 0630   CO2 25 04/07/2016 0932   GLUCOSE 105 04/07/2016 0932   BUN 15.6 04/07/2016 0932   CREATININE 1.2 04/07/2016 0932   CALCIUM 9.6 04/07/2016 0932   PROT 7.5 04/07/2016 0932   ALBUMIN 3.9 04/07/2016 0932   AST 25 04/07/2016 0932   ALT 41 04/07/2016 0932   ALKPHOS 101 04/07/2016 0932   BILITOT 0.38 04/07/2016 0932   GFRNONAA 78 (L) 09/30/2013 0630   GFRAA >90 09/30/2013 0630    No results found for: SPEP, UPEP  Lab Results  Component Value Date   WBC 8.7 04/07/2016   NEUTROABS 4.6 04/07/2016   HGB 16.3 04/07/2016   HCT 48.2 04/07/2016   MCV 88.9 04/07/2016   PLT 205 04/07/2016      Chemistry      Component Value Date/Time   NA 140 04/07/2016 0932   K 4.3 04/07/2016 0932   CL 102 09/30/2013 0630   CO2 25 04/07/2016 0932   BUN 15.6 04/07/2016 0932   CREATININE 1.2 04/07/2016 0932      Component Value Date/Time   CALCIUM 9.6 04/07/2016 0932   ALKPHOS 101 04/07/2016 0932   AST 25 04/07/2016 0932   ALT 41 04/07/2016 0932   BILITOT 0.38 04/07/2016 0932      ASSESSMENT & PLAN:   History of B-cell lymphoma Clinically, he is doing well with normal blood work and without new symptoms. He has a lot of lymphedema around the neck, stable CT scan with contrast last year showed no evidence of cancer I plan to see him back next in 6 months with repeat history, physical examination and blood work.  Essential hypertension His blood pressure is poorly controlled He is taking lisinopril I recommend dietary modification, reduce salt intake and to lose weight If his blood pressure remained poorly controlled by the next visit, I will recommend dose adjustment to his blood pressure medication  Acquired hypothyroidism He has symptoms of fatigue and is noticed to have weight gain Thyroid function tests came back abnormal I will increase the dose of Synthroid to 50 mcg and recheck in 6 months I explained to him and his daughter that this is related to exposure to radiation therapy.   Orders  Placed This Encounter  Procedures  . Comprehensive metabolic panel    Standing Status:   Future    Standing Expiration Date:   05/12/2017  . CBC with Differential/Platelet    Standing Status:   Future    Standing Expiration Date:   05/12/2017  . TSH    Standing Status:   Future    Standing Expiration Date:   05/12/2017   All questions were answered. The patient knows to call the clinic with any problems, questions or concerns. No barriers to learning was detected. I spent 15 minutes counseling the patient face to face. The total time spent in the appointment was 20 minutes and more than 50% was on counseling and review of test results     [name], MD 04/07/2016 11:51 AM

## 2016-04-07 NOTE — Assessment & Plan Note (Signed)
His blood pressure is poorly controlled He is taking lisinopril I recommend dietary modification, reduce salt intake and to lose weight If his blood pressure remained poorly controlled by the next visit, I will recommend dose adjustment to his blood pressure medication

## 2016-04-07 NOTE — Telephone Encounter (Signed)
-----   Message from [name], MD sent at 04/07/2016 11:18 AM EDT ----- Regarding: abnormal thyroid Pls call her daughter: TSH is abnormal Increase levothyroxine to 50 mcg PO daily Call in to local pharmacy: 30 tabs, 6 refillls ----- Message ----- From: Interface, Lab In Three Zero One Sent: 04/07/2016   9:39 AM To: [name], MD

## 2016-04-07 NOTE — Assessment & Plan Note (Signed)
Clinically, he is doing well with normal blood work and without new symptoms. He has a lot of lymphedema around the neck, stable CT scan with contrast last year showed no evidence of cancer I plan to see him back next in 6 months with repeat history, physical examination and blood work. 

## 2016-04-07 NOTE — Assessment & Plan Note (Signed)
He has symptoms of fatigue and is noticed to have weight gain Thyroid function tests came back abnormal I will increase the dose of Synthroid to 50 mcg and recheck in 6 months I explained to him and his daughter that this is related to exposure to radiation therapy.

## 2016-05-21 ENCOUNTER — Encounter: Payer: Self-pay | Admitting: Family Medicine

## 2016-06-25 MED FILL — LISINOPRIL 20 MG TAB: 20 | 90 days supply | Qty: 90 | Fill #1

## 2016-07-28 ENCOUNTER — Other Ambulatory Visit: Payer: Self-pay

## 2016-10-07 ENCOUNTER — Other Ambulatory Visit (HOSPITAL_BASED_OUTPATIENT_CLINIC_OR_DEPARTMENT_OTHER): Payer: Medicare Other

## 2016-10-07 ENCOUNTER — Ambulatory Visit (HOSPITAL_BASED_OUTPATIENT_CLINIC_OR_DEPARTMENT_OTHER): Payer: Medicare Other | Admitting: Hematology and Oncology

## 2016-10-07 ENCOUNTER — Telehealth: Payer: Self-pay | Admitting: Hematology and Oncology

## 2016-10-07 VITALS — BP 123/75 | HR 61 | Temp 98.6°F | Resp 17 | Ht 61.0 in | Wt 214.0 lb

## 2016-10-07 DIAGNOSIS — Z8572 Personal history of non-Hodgkin lymphomas: Secondary | ICD-10-CM

## 2016-10-07 DIAGNOSIS — E039 Hypothyroidism, unspecified: Secondary | ICD-10-CM

## 2016-10-07 DIAGNOSIS — Z23 Encounter for immunization: Secondary | ICD-10-CM | POA: Diagnosis not present

## 2016-10-07 DIAGNOSIS — C8599 Non-Hodgkin lymphoma, unspecified, extranodal and solid organ sites: Secondary | ICD-10-CM

## 2016-10-07 DIAGNOSIS — I890 Lymphedema, not elsewhere classified: Secondary | ICD-10-CM

## 2016-10-07 DIAGNOSIS — Z299 Encounter for prophylactic measures, unspecified: Secondary | ICD-10-CM

## 2016-10-07 LAB — COMPREHENSIVE METABOLIC PANEL
ALBUMIN: 4.1 g/dL (ref 3.5–5.0)
ALK PHOS: 92 U/L (ref 40–150)
ALT: 77 U/L — AB (ref 0–55)
ANION GAP: 9 meq/L (ref 3–11)
AST: 57 U/L — ABNORMAL HIGH (ref 5–34)
BUN: 16.8 mg/dL (ref 7.0–26.0)
CALCIUM: 9.8 mg/dL (ref 8.4–10.4)
CHLORIDE: 104 meq/L (ref 98–109)
CO2: 25 mEq/L (ref 22–29)
CREATININE: 1.3 mg/dL (ref 0.7–1.3)
EGFR: 56 mL/min/{1.73_m2} — ABNORMAL LOW (ref >90)
Glucose: 124 mg/dl (ref 70–140)
POTASSIUM: 4.3 meq/L (ref 3.5–5.1)
Sodium: 138 mEq/L (ref 136–145)
Total Bilirubin: 0.66 mg/dL (ref 0.20–1.20)
Total Protein: 8.1 g/dL (ref 6.4–8.3)

## 2016-10-07 LAB — CBC WITH DIFFERENTIAL/PLATELET
BASO%: 0.7 % (ref 0.0–2.0)
Basophils Absolute: 0.1 10*3/uL (ref 0.0–0.1)
EOS ABS: 0.1 10*3/uL (ref 0.0–0.5)
EOS%: 0.7 % (ref 0.0–7.0)
HCT: 48.5 % (ref 38.4–49.9)
HEMOGLOBIN: 16.6 g/dL (ref 13.0–17.1)
LYMPH%: 21.7 % (ref 14.0–49.0)
MCH: 30.1 pg (ref 27.2–33.4)
MCHC: 34.1 g/dL (ref 32.0–36.0)
MCV: 88 fL (ref 79.3–98.0)
MONO#: 0.8 10*3/uL (ref 0.1–0.9)
MONO%: 8.1 % (ref 0.0–14.0)
NEUT%: 68.8 % (ref 39.0–75.0)
NEUTROS ABS: 7.2 10*3/uL — AB (ref 1.5–6.5)
Platelets: 204 10*3/uL (ref 140–400)
RBC: 5.51 10*6/uL (ref 4.20–5.82)
RDW: 13.7 % (ref 11.0–14.6)
WBC: 10.5 10*3/uL — AB (ref 4.0–10.3)
lymph#: 2.3 10*3/uL (ref 0.9–3.3)

## 2016-10-07 LAB — TSH: TSH: 7.183 m[IU]/L — AB (ref 0.320–4.118)

## 2016-10-07 MED ORDER — LISINOPRIL 20 MG PO TABS: 20.0000 mg | ORAL_TABLET | Freq: Every day | ORAL | 11 refills | Status: AC

## 2016-10-07 MED ORDER — LEVOTHYROXINE SODIUM 50 MCG PO TABS: 50.0000 ug | ORAL_TABLET | Freq: Every day | ORAL | 6 refills | Status: AC

## 2016-10-07 MED ORDER — INFLUENZA VAC SPLIT QUAD 0.5 ML IM SUSY
0.5000 mL | PREFILLED_SYRINGE | Freq: Once | INTRAMUSCULAR | Status: AC
  Administered 2016-10-07: 0.5 mL via INTRAMUSCULAR
  Filled 2016-10-07: qty 0.5

## 2016-10-07 MED FILL — LEVOTHYROXINE 50 MCG TABLET: 50 | 30 days supply | Qty: 30 | Fill #0

## 2016-10-07 MED FILL — LISINOPRIL 20 MG TAB: 20 | 90 days supply | Qty: 90 | Fill #0

## 2016-10-07 NOTE — Telephone Encounter (Signed)
Gave avs and calendar for April 2019 °

## 2016-10-08 ENCOUNTER — Encounter: Payer: Self-pay | Admitting: Hematology and Oncology

## 2016-10-08 NOTE — Progress Notes (Signed)
[place] OFFICE PROGRESS NOTE  Patient Care Team: Boykin Nearing, MD as PCP - General (Family Medicine) [name], MD as Consulting Physician (Hematology and Oncology) [name], MD as Attending Physician (Radiation Oncology) Leota Sauers, RN as Oncology Nurse Navigator  SUMMARY OF ONCOLOGIC HISTORY: Oncology History   Diffuse large B cell lymphoma   Primary site: Lymphoid Neoplasms   Staging method: AJCC 6th Edition   Clinical: Stage II signed by [name], MD on 09/23/2013  5:06 PM   Summary: Stage II       History of B-cell lymphoma   09/10/2013 Imaging    CT scan of the neck confirmed abnormal tonsil region with regional lymphadenopathy      09/13/2013 Procedure    Accession: [id number] consult biopsy showed atypical cells      09/21/2013 Surgery    He underwent tonsil biopsy and right lymph node excisional biopsy.      09/21/2013 Pathology Results    Accession: [id number] biopsy from tonsil confirmed diffuse large B-cell lymphoma      09/24/2013 Imaging    PET CT scan confirmed abnormal oropharyngeal mass with regional lymphadenopathy      09/26/2013 Bone Marrow Biopsy    Bone marrow biopsy showed no involvement of lymphoma      10/07/2013 - 11/17/2013 Chemotherapy    He received 3 cycles of R. CHOP chemotherapy with dose adjustment due to abnormal liver function test along with prophylactic intrathecal chemotherapy with methotrexate      11/15/2013 Imaging    CT scan of the neck show no evidence of abscess. Prior lymphadenopathy has reduced in size.      12/15/2013 Imaging    PET/CT scan showed near complete response to treatment.      01/11/2014 - 02/14/2014 Radiation Therapy    The patient received radiation treatment      03/16/2014 Imaging    PET CT scan showed complete response to treatment.      10/17/2014 Imaging    CT neck was negative for recurrence       INTERVAL HISTORY: Please see below for problem oriented  charting. He returns with her daughter for further follow-up His daughter serves as interpreter The patient complained of intermittent swelling of the face and legs He states that whenever he has intermittent swelling, he will take his thyroid medicine He is taking his blood pressure medication consistently He stated he ran out of his thyroid medicine recently He has not been established to see a local primary care doctor He denies swallowing difficulties, new lymphadenopathy or recent infection  REVIEW OF SYSTEMS:   Constitutional: Denies fevers, chills or abnormal weight loss Eyes: Denies blurriness of vision Ears, nose, mouth, throat, and face: Denies mucositis or sore throat Respiratory: Denies cough, dyspnea or wheezes Cardiovascular: Denies palpitation, chest discomfort  Gastrointestinal:  Denies nausea, heartburn or change in bowel habits Skin: Denies abnormal skin rashes Lymphatics: Denies new lymphadenopathy or easy bruising Neurological:Denies numbness, tingling or new weaknesses Behavioral/Psych: Mood is stable, no new changes  All other systems were reviewed with the patient and are negative.  I have reviewed the past medical history, past surgical history, social history and family history with the patient and they are unchanged from previous note.  ALLERGIES:  is allergic to penicillin g and penicillins.  MEDICATIONS:  Current Outpatient Prescriptions  Medication Sig Dispense Refill  . levothyroxine (SYNTHROID) 50 MCG tablet Take 1 tablet (50 mcg total) by mouth daily before breakfast. 30 tablet  6  . lisinopril (PRINIVIL,ZESTRIL) 20 MG tablet Take 1 tablet (20 mg total) by mouth daily. 90 tablet 11   No current facility-administered medications for this visit.     PHYSICAL EXAMINATION: ECOG PERFORMANCE STATUS: 1 - Symptomatic but completely ambulatory  Vitals:   10/07/16 1051  BP: 123/75  Pulse: 61  Resp: 17  Temp: 98.6 F (37 C)  SpO2: 100%   Filed  Weights   10/07/16 1051  Weight: 214 lb (97.1 kg)    GENERAL:alert, no distress and comfortable SKIN: skin color, texture, turgor are normal, no rashes or significant lesions EYES: normal, Conjunctiva are pink and non-injected, sclera clear OROPHARYNX:no exudate, no erythema and lips, buccal mucosa, and tongue normal  NECK: He has lymphedema around his neck from prior surgery and radiation  lYMPH:  no palpable lymphadenopathy in the cervical, axillary or inguinal LUNGS: clear to auscultation and percussion with normal breathing effort HEART: regular rate & rhythm and no murmurs and no lower extremity edema ABDOMEN:abdomen soft, non-tender and normal bowel sounds Musculoskeletal:no cyanosis of digits and no clubbing  NEURO: alert & oriented x 3 with fluent speech, no focal motor/sensory deficits  LABORATORY DATA:  I have reviewed the data as listed    Component Value Date/Time   NA 138 10/07/2016 1040   K 4.3 10/07/2016 1040   CL 102 09/30/2013 0630   CO2 25 10/07/2016 1040   GLUCOSE 124 10/07/2016 1040   BUN 16.8 10/07/2016 1040   CREATININE 1.3 10/07/2016 1040   CALCIUM 9.8 10/07/2016 1040   PROT 8.1 10/07/2016 1040   ALBUMIN 4.1 10/07/2016 1040   AST 57 (H) 10/07/2016 1040   ALT 77 (H) 10/07/2016 1040   ALKPHOS 92 10/07/2016 1040   BILITOT 0.66 10/07/2016 1040   GFRNONAA 78 (L) 09/30/2013 0630   GFRAA >90 09/30/2013 0630    No results found for: SPEP, UPEP  Lab Results  Component Value Date   WBC 10.5 (H) 10/07/2016   NEUTROABS 7.2 (H) 10/07/2016   HGB 16.6 10/07/2016   HCT 48.5 10/07/2016   MCV 88.0 10/07/2016   PLT 204 10/07/2016      Chemistry      Component Value Date/Time   NA 138 10/07/2016 1040   K 4.3 10/07/2016 1040   CL 102 09/30/2013 0630   CO2 25 10/07/2016 1040   BUN 16.8 10/07/2016 1040   CREATININE 1.3 10/07/2016 1040      Component Value Date/Time   CALCIUM 9.8 10/07/2016 1040   ALKPHOS 92 10/07/2016 1040   AST 57 (H) 10/07/2016 1040    ALT 77 (H) 10/07/2016 1040   BILITOT 0.66 10/07/2016 1040      ASSESSMENT & PLAN:  History of B-cell lymphoma Clinically, he is doing well with normal blood work and without new symptoms. He has a lot of lymphedema around the neck, stable CT scan with contrast last year showed no evidence of cancer I plan to see him back next in 6 months with repeat history, physical examination and blood work.  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 He does not have any prior allergic reactions to influenza vaccination. He agrees to proceed with influenza vaccination today and we will administer it today at the clinic.    Orders Placed This Encounter  Procedures  . Comprehensive metabolic panel    Standing Status:   Future    Standing Expiration Date:   11/11/2017  . CBC with Differential/Platelet    Standing Status:   Future    Standing Expiration Date:   11/11/2017  . TSH    Standing Status:   Future    Standing Expiration Date:   11/11/2017   All questions were answered. The patient knows to call the clinic with any problems, questions or concerns. No barriers to learning was detected. I spent 15 minutes counseling the patient face to face. The total time spent in the appointment was 20 minutes and more than 50% was on counseling and review of test results     [name], MD 10/08/2016 2:17 PM

## 2016-10-08 NOTE — Assessment & Plan Note (Signed)
We discussed the importance of preventive care and reviewed the vaccination programs. He does not have any prior allergic reactions to influenza vaccination. He agrees to proceed with influenza vaccination today and we will administer it today at the clinic.  

## 2016-10-08 NOTE — Assessment & Plan Note (Signed)
This is related to prior surgery and radiation and pockets of seroma Observe only

## 2016-10-08 NOTE — Assessment & Plan Note (Signed)
The patient has poor understanding I reinforced the importance of taking thyroid medicine for the rest of his life I suspect the mild fluid retention and bradycardia are related to acquired hypothyroidism The patient understood he needs to get established with a primary care doctor for medication management and TSH monitoring

## 2016-10-08 NOTE — Assessment & Plan Note (Signed)
Clinically, he is doing well with normal blood work and without new symptoms. He has a lot of lymphedema around the neck, stable CT scan with contrast last year showed no evidence of cancer I plan to see him back next in 6 months with repeat history, physical examination and blood work.

## 2016-11-25 MED FILL — LEVOTHYROXINE 50 MCG TABLET: 50 | 30 days supply | Qty: 30 | Fill #1

## 2017-03-31 MED FILL — LEVOTHYROXINE 50 MCG TABLET: 50 | 30 days supply | Qty: 30 | Fill #2

## 2017-03-31 MED FILL — LISINOPRIL 20 MG TABLET: 20 | 90 days supply | Qty: 90 | Fill #1

## 2017-04-07 ENCOUNTER — Inpatient Hospital Stay: Payer: Medicare Other

## 2017-04-07 ENCOUNTER — Telehealth: Payer: Self-pay

## 2017-04-07 ENCOUNTER — Encounter: Payer: Self-pay | Admitting: Hematology and Oncology

## 2017-04-07 ENCOUNTER — Telehealth: Payer: Self-pay | Admitting: Hematology and Oncology

## 2017-04-07 ENCOUNTER — Inpatient Hospital Stay: Payer: Medicare Other | Attending: Hematology and Oncology | Admitting: Hematology and Oncology

## 2017-04-07 DIAGNOSIS — E039 Hypothyroidism, unspecified: Secondary | ICD-10-CM

## 2017-04-07 DIAGNOSIS — Z8572 Personal history of non-Hodgkin lymphomas: Secondary | ICD-10-CM

## 2017-04-07 DIAGNOSIS — I890 Lymphedema, not elsewhere classified: Secondary | ICD-10-CM | POA: Insufficient documentation

## 2017-04-07 DIAGNOSIS — I10 Essential (primary) hypertension: Secondary | ICD-10-CM | POA: Insufficient documentation

## 2017-04-07 DIAGNOSIS — Z299 Encounter for prophylactic measures, unspecified: Secondary | ICD-10-CM

## 2017-04-07 LAB — CBC WITH DIFFERENTIAL/PLATELET
Basophils Absolute: 0 10*3/uL (ref 0.0–0.1)
Basophils Relative: 0 %
Eosinophils Absolute: 0.2 10*3/uL (ref 0.0–0.5)
Eosinophils Relative: 2 %
HEMATOCRIT: 49.2 % (ref 38.4–49.9)
Hemoglobin: 16.8 g/dL (ref 13.0–17.1)
LYMPHS ABS: 2.9 10*3/uL (ref 0.9–3.3)
LYMPHS PCT: 29 %
MCH: 30.4 pg (ref 27.2–33.4)
MCHC: 34.1 g/dL (ref 32.0–36.0)
MCV: 89 fL (ref 79.3–98.0)
MONO ABS: 0.9 10*3/uL (ref 0.1–0.9)
Monocytes Relative: 9 %
NEUTROS ABS: 5.8 10*3/uL (ref 1.5–6.5)
Neutrophils Relative %: 60 %
Platelets: 178 10*3/uL (ref 140–400)
RBC: 5.53 MIL/uL (ref 4.20–5.82)
RDW: 14.1 % (ref 11.0–14.6)
WBC: 9.7 10*3/uL (ref 4.0–10.3)

## 2017-04-07 LAB — COMPREHENSIVE METABOLIC PANEL
ALT: 49 U/L (ref 0–55)
AST: 30 U/L (ref 5–34)
Albumin: 3.9 g/dL (ref 3.5–5.0)
Alkaline Phosphatase: 103 U/L (ref 40–150)
Anion gap: 10 (ref 3–11)
BILIRUBIN TOTAL: 0.6 mg/dL (ref 0.2–1.2)
BUN: 15 mg/dL (ref 7–26)
CO2: 25 mmol/L (ref 22–29)
CREATININE: 1.27 mg/dL (ref 0.70–1.30)
Calcium: 9.7 mg/dL (ref 8.4–10.4)
Chloride: 103 mmol/L (ref 98–109)
GFR, EST NON AFRICAN AMERICAN: 57 mL/min — AB (ref >60)
Glucose, Bld: 141 mg/dL — ABNORMAL HIGH (ref 70–140)
POTASSIUM: 4.5 mmol/L (ref 3.5–5.1)
Sodium: 138 mmol/L (ref 136–145)
TOTAL PROTEIN: 7.8 g/dL (ref 6.4–8.3)

## 2017-04-07 LAB — TSH: TSH: 4.423 u[IU]/mL — AB (ref 0.320–4.118)

## 2017-04-07 NOTE — Telephone Encounter (Signed)
Gave avs and calendar ° °

## 2017-04-07 NOTE — Progress Notes (Signed)
[place] OFFICE PROGRESS NOTE  Patient Care Team: Boykin Nearing, MD as PCP - General (Family Medicine) [name], MD as Consulting Physician (Hematology and Oncology) [name], MD as Attending Physician (Radiation Oncology) Leota Sauers, RN as Oncology Nurse Navigator  ASSESSMENT & PLAN:  History of B-cell lymphoma Clinically, he is doing well with normal blood work and without new symptoms. He has a lot of lymphedema around the neck, stable I plan to see him back next in 6 months with repeat history, physical examination and blood work.  Essential hypertension His blood pressure is stable He is taking lisinopril I recommend dietary modification, reduce salt intake and to lose weight   Acquired hypothyroidism He has chronic hypothyroidism due to exposure to radiation His TSH is satisfactory He will continue current dose of levothyroxine  Preventive measure The patient has received influenza vaccination He has not received any other age-appropriate preventive care measures I recommend him to return to see primary care doctor for follow-up   No orders of the defined types were placed in this encounter.   INTERVAL HISTORY: Please see below for problem oriented charting. He returns for further follow-up He is compliant taking his medications as prescribed He has some mild dry mouth but denies dysphagia No new lymphadenopathy His appetite is stable without any recent weight loss He has not seen a primary care doctor for a while His daughter is present to interpret for him  SUMMARY OF ONCOLOGIC HISTORY: Oncology History   Diffuse large B cell lymphoma   Primary site: Lymphoid Neoplasms   Staging method: AJCC 6th Edition   Clinical: Stage II signed by [name], MD on 09/23/2013  5:06 PM   Summary: Stage II       History of B-cell lymphoma   09/10/2013 Imaging    CT scan of the neck confirmed abnormal tonsil region with regional  lymphadenopathy      09/13/2013 Procedure    Accession: [id number] consult biopsy showed atypical cells      09/21/2013 Surgery    He underwent tonsil biopsy and right lymph node excisional biopsy.      09/21/2013 Pathology Results    Accession: [id number] biopsy from tonsil confirmed diffuse large B-cell lymphoma      09/24/2013 Imaging    PET CT scan confirmed abnormal oropharyngeal mass with regional lymphadenopathy      09/26/2013 Bone Marrow Biopsy    Bone marrow biopsy showed no involvement of lymphoma      10/07/2013 - 11/17/2013 Chemotherapy    He received 3 cycles of R. CHOP chemotherapy with dose adjustment due to abnormal liver function test along with prophylactic intrathecal chemotherapy with methotrexate      11/15/2013 Imaging    CT scan of the neck show no evidence of abscess. Prior lymphadenopathy has reduced in size.      12/15/2013 Imaging    PET/CT scan showed near complete response to treatment.      01/11/2014 - 02/14/2014 Radiation Therapy    The patient received radiation treatment      03/16/2014 Imaging    PET CT scan showed complete response to treatment.      10/17/2014 Imaging    CT neck was negative for recurrence       REVIEW OF SYSTEMS:   Constitutional: Denies fevers, chills or abnormal weight loss Eyes: Denies blurriness of vision Ears, nose, mouth, throat, and face: Denies mucositis or sore throat Respiratory: Denies cough, dyspnea or wheezes Cardiovascular: Denies  palpitation, chest discomfort or lower extremity swelling Gastrointestinal:  Denies nausea, heartburn or change in bowel habits Skin: Denies abnormal skin rashes Lymphatics: Denies new lymphadenopathy or easy bruising Neurological:Denies numbness, tingling or new weaknesses Behavioral/Psych: Mood is stable, no new changes  All other systems were reviewed with the patient and are negative.  I have reviewed the past medical history, past surgical history, social history  and family history with the patient and they are unchanged from previous note.  ALLERGIES:  is allergic to penicillin g and penicillins.  MEDICATIONS:  Current Outpatient Medications  Medication Sig Dispense Refill  . levothyroxine (SYNTHROID) 50 MCG tablet Take 1 tablet (50 mcg total) by mouth daily before breakfast. 30 tablet 6  . lisinopril (PRINIVIL,ZESTRIL) 20 MG tablet Take 1 tablet (20 mg total) by mouth daily. 90 tablet 11   No current facility-administered medications for this visit.     PHYSICAL EXAMINATION: ECOG PERFORMANCE STATUS: 1 - Symptomatic but completely ambulatory  Vitals:   04/07/17 0950  BP: (!) 141/80  Pulse: 60  Resp: 18  Temp: 97.7 F (36.5 C)  SpO2: 100%   Filed Weights   04/07/17 0950  Weight: 208 lb 11.2 oz (94.7 kg)    GENERAL:alert, no distress and comfortable SKIN: skin color, texture, turgor are normal, no rashes or significant lesions EYES: normal, Conjunctiva are pink and non-injected, sclera clear OROPHARYNX:no exudate, no erythema and lips, buccal mucosa, and tongue normal  NECK: Noted lymphedema around his neck  lYMPH:  no palpable lymphadenopathy in the cervical, axillary or inguinal LUNGS: clear to auscultation and percussion with normal breathing effort HEART: regular rate & rhythm and no murmurs and no lower extremity edema ABDOMEN:abdomen soft, non-tender and normal bowel sounds Musculoskeletal:no cyanosis of digits and no clubbing  NEURO: alert & oriented x 3 with fluent speech, no focal motor/sensory deficits  LABORATORY DATA:  I have reviewed the data as listed    Component Value Date/Time   NA 138 04/07/2017 0920   NA 138 10/07/2016 1040   K 4.5 04/07/2017 0920   K 4.3 10/07/2016 1040   CL 103 04/07/2017 0920   CO2 25 04/07/2017 0920   CO2 25 10/07/2016 1040   GLUCOSE 141 (H) 04/07/2017 0920   GLUCOSE 124 10/07/2016 1040   BUN 15 04/07/2017 0920   BUN 16.8 10/07/2016 1040   CREATININE 1.27 04/07/2017 0920    CREATININE 1.3 10/07/2016 1040   CALCIUM 9.7 04/07/2017 0920   CALCIUM 9.8 10/07/2016 1040   PROT 7.8 04/07/2017 0920   PROT 8.1 10/07/2016 1040   ALBUMIN 3.9 04/07/2017 0920   ALBUMIN 4.1 10/07/2016 1040   AST 30 04/07/2017 0920   AST 57 (H) 10/07/2016 1040   ALT 49 04/07/2017 0920   ALT 77 (H) 10/07/2016 1040   ALKPHOS 103 04/07/2017 0920   ALKPHOS 92 10/07/2016 1040   BILITOT 0.6 04/07/2017 0920   BILITOT 0.66 10/07/2016 1040   GFRNONAA 57 (L) 04/07/2017 0920   GFRAA >60 04/07/2017 0920    No results found for: SPEP, UPEP  Lab Results  Component Value Date   WBC 9.7 04/07/2017   NEUTROABS 5.8 04/07/2017   HGB 16.8 04/07/2017   HCT 49.2 04/07/2017   MCV 89.0 04/07/2017   PLT 178 04/07/2017      Chemistry      Component Value Date/Time   NA 138 04/07/2017 0920   NA 138 10/07/2016 1040   K 4.5 04/07/2017 0920   K 4.3 10/07/2016 1040   CL  103 04/07/2017 0920   CO2 25 04/07/2017 0920   CO2 25 10/07/2016 1040   BUN 15 04/07/2017 0920   BUN 16.8 10/07/2016 1040   CREATININE 1.27 04/07/2017 0920   CREATININE 1.3 10/07/2016 1040      Component Value Date/Time   CALCIUM 9.7 04/07/2017 0920   CALCIUM 9.8 10/07/2016 1040   ALKPHOS 103 04/07/2017 0920   ALKPHOS 92 10/07/2016 1040   AST 30 04/07/2017 0920   AST 57 (H) 10/07/2016 1040   ALT 49 04/07/2017 0920   ALT 77 (H) 10/07/2016 1040   BILITOT 0.6 04/07/2017 0920   BILITOT 0.66 10/07/2016 1040       All questions were answered. The patient knows to call the clinic with any problems, questions or concerns. No barriers to learning was detected.  I spent 15 minutes counseling the patient face to face. The total time spent in the appointment was 20 minutes and more than 50% was on counseling and review of test results  [name], MD 04/07/2017 12:20 PM

## 2017-04-07 NOTE — Telephone Encounter (Signed)
-----   Message from [name], MD sent at 04/07/2017 11:19 AM EDT ----- Regarding: TSH pls let her daughter aware all tests are OK continue same dose thyroid medicine and BP medicine ----- Message ----- From: [facility], [facility] In [place] Sent: 04/07/2017   9:29 AM To: [name], MD

## 2017-04-07 NOTE — Assessment & Plan Note (Signed)
The patient has received influenza vaccination He has not received any other age-appropriate preventive care measures I recommend him to return to see primary care doctor for follow-up

## 2017-04-07 NOTE — Telephone Encounter (Signed)
Attempted to leave message. Mailbox full.

## 2017-04-07 NOTE — Assessment & Plan Note (Signed)
He has chronic hypothyroidism due to exposure to radiation His TSH is satisfactory He will continue current dose of levothyroxine

## 2017-04-07 NOTE — Assessment & Plan Note (Signed)
His blood pressure is stable He is taking lisinopril I recommend dietary modification, reduce salt intake and to lose weight

## 2017-04-07 NOTE — Assessment & Plan Note (Signed)
Clinically, he is doing well with normal blood work and without new symptoms. He has a lot of lymphedema around the neck, stable I plan to see him back next in 6 months with repeat history, physical examination and blood work.

## 2017-04-08 NOTE — Telephone Encounter (Signed)
Called daughter and given below message. Verbalized understanding.

## 2017-08-04 MED FILL — LISINOPRIL 20 MG TABLET: 20 | 90 days supply | Qty: 90 | Fill #2

## 2017-08-04 MED FILL — LEVOTHYROXINE 50 MCG TABLET: 50 | 30 days supply | Qty: 30 | Fill #3

## 2017-08-10 DIAGNOSIS — R739 Hyperglycemia, unspecified: Secondary | ICD-10-CM | POA: Diagnosis not present

## 2017-08-10 DIAGNOSIS — I10 Essential (primary) hypertension: Secondary | ICD-10-CM | POA: Diagnosis not present

## 2017-08-10 DIAGNOSIS — E7800 Pure hypercholesterolemia, unspecified: Secondary | ICD-10-CM | POA: Diagnosis not present

## 2017-08-10 DIAGNOSIS — C8591 Non-Hodgkin lymphoma, unspecified, lymph nodes of head, face, and neck: Secondary | ICD-10-CM | POA: Diagnosis not present

## 2017-08-10 DIAGNOSIS — E039 Hypothyroidism, unspecified: Secondary | ICD-10-CM | POA: Diagnosis not present

## 2017-08-12 ENCOUNTER — Telehealth: Payer: Self-pay | Admitting: Hematology and Oncology

## 2017-08-12 NOTE — Telephone Encounter (Signed)
FAXED RECORDS TO [facility] RELEASE ID [id number]

## 2017-08-25 DIAGNOSIS — N481 Balanitis: Secondary | ICD-10-CM | POA: Diagnosis not present

## 2017-08-25 DIAGNOSIS — E039 Hypothyroidism, unspecified: Secondary | ICD-10-CM | POA: Diagnosis not present

## 2017-08-25 DIAGNOSIS — E7800 Pure hypercholesterolemia, unspecified: Secondary | ICD-10-CM | POA: Diagnosis not present

## 2017-08-25 DIAGNOSIS — E1121 Type 2 diabetes mellitus with diabetic nephropathy: Secondary | ICD-10-CM | POA: Diagnosis not present

## 2017-09-30 DIAGNOSIS — I10 Essential (primary) hypertension: Secondary | ICD-10-CM | POA: Diagnosis not present

## 2017-09-30 DIAGNOSIS — E039 Hypothyroidism, unspecified: Secondary | ICD-10-CM | POA: Diagnosis not present

## 2017-09-30 DIAGNOSIS — E7800 Pure hypercholesterolemia, unspecified: Secondary | ICD-10-CM | POA: Diagnosis not present

## 2017-10-01 ENCOUNTER — Telehealth: Payer: Self-pay | Admitting: Hematology and Oncology

## 2017-10-01 NOTE — Telephone Encounter (Signed)
FAXED RECORDS TO [facility] RELEASE ID [id number]

## 2017-10-07 ENCOUNTER — Other Ambulatory Visit: Payer: Self-pay | Admitting: Hematology and Oncology

## 2017-10-07 DIAGNOSIS — E039 Hypothyroidism, unspecified: Secondary | ICD-10-CM

## 2017-10-07 DIAGNOSIS — C8599 Non-Hodgkin lymphoma, unspecified, extranodal and solid organ sites: Secondary | ICD-10-CM

## 2017-10-08 ENCOUNTER — Inpatient Hospital Stay: Payer: Medicare Other

## 2017-10-08 ENCOUNTER — Encounter: Payer: Self-pay | Admitting: Hematology and Oncology

## 2017-10-08 ENCOUNTER — Inpatient Hospital Stay: Payer: Medicare Other | Attending: Hematology and Oncology | Admitting: Hematology and Oncology

## 2017-10-14 DIAGNOSIS — E039 Hypothyroidism, unspecified: Secondary | ICD-10-CM | POA: Diagnosis not present

## 2017-10-21 DIAGNOSIS — C8331 Diffuse large B-cell lymphoma, lymph nodes of head, face, and neck: Secondary | ICD-10-CM | POA: Diagnosis not present

## 2017-10-21 DIAGNOSIS — R0602 Shortness of breath: Secondary | ICD-10-CM | POA: Diagnosis not present

## 2017-10-21 DIAGNOSIS — R5383 Other fatigue: Secondary | ICD-10-CM | POA: Diagnosis not present

## 2017-10-27 DIAGNOSIS — E1122 Type 2 diabetes mellitus with diabetic chronic kidney disease: Secondary | ICD-10-CM | POA: Diagnosis not present

## 2017-10-27 DIAGNOSIS — Z1211 Encounter for screening for malignant neoplasm of colon: Secondary | ICD-10-CM | POA: Diagnosis not present

## 2017-10-27 DIAGNOSIS — I129 Hypertensive chronic kidney disease with stage 1 through stage 4 chronic kidney disease, or unspecified chronic kidney disease: Secondary | ICD-10-CM | POA: Diagnosis not present

## 2017-10-27 DIAGNOSIS — T188XXS Foreign body in other parts of alimentary tract, sequela: Secondary | ICD-10-CM | POA: Diagnosis not present

## 2017-10-27 DIAGNOSIS — T184XXA Foreign body in colon, initial encounter: Secondary | ICD-10-CM | POA: Diagnosis not present

## 2017-10-27 DIAGNOSIS — Z8572 Personal history of non-Hodgkin lymphomas: Secondary | ICD-10-CM | POA: Diagnosis not present

## 2017-10-27 DIAGNOSIS — N189 Chronic kidney disease, unspecified: Secondary | ICD-10-CM | POA: Diagnosis not present

## 2017-10-27 DIAGNOSIS — X58XXXA Exposure to other specified factors, initial encounter: Secondary | ICD-10-CM | POA: Diagnosis not present

## 2017-10-27 DIAGNOSIS — Y929 Unspecified place or not applicable: Secondary | ICD-10-CM | POA: Diagnosis not present

## 2017-10-27 DIAGNOSIS — I10 Essential (primary) hypertension: Secondary | ICD-10-CM | POA: Diagnosis not present

## 2017-10-27 DIAGNOSIS — Y999 Unspecified external cause status: Secondary | ICD-10-CM | POA: Diagnosis not present

## 2017-10-27 DIAGNOSIS — E039 Hypothyroidism, unspecified: Secondary | ICD-10-CM | POA: Diagnosis not present

## 2017-10-27 DIAGNOSIS — E1121 Type 2 diabetes mellitus with diabetic nephropathy: Secondary | ICD-10-CM | POA: Diagnosis not present

## 2017-12-01 DIAGNOSIS — E1121 Type 2 diabetes mellitus with diabetic nephropathy: Secondary | ICD-10-CM | POA: Diagnosis not present

## 2017-12-01 DIAGNOSIS — E039 Hypothyroidism, unspecified: Secondary | ICD-10-CM | POA: Diagnosis not present

## 2017-12-01 DIAGNOSIS — E7800 Pure hypercholesterolemia, unspecified: Secondary | ICD-10-CM | POA: Diagnosis not present

## 2017-12-01 DIAGNOSIS — I10 Essential (primary) hypertension: Secondary | ICD-10-CM | POA: Diagnosis not present
